# Patient Record
Sex: Male | Born: 1935 | Race: Black or African American | Hispanic: No | Marital: Single | Smoking: Former smoker
Health system: Southern US, Community
[De-identification: ages and names within clinical notes are randomized; demographics above are authoritative.]

## PROBLEM LIST (undated history)

## (undated) DIAGNOSIS — R569 Unspecified convulsions: Secondary | ICD-10-CM

## (undated) DIAGNOSIS — D519 Vitamin B12 deficiency anemia, unspecified: Secondary | ICD-10-CM

## (undated) DIAGNOSIS — R4182 Altered mental status, unspecified: Secondary | ICD-10-CM

## (undated) DIAGNOSIS — I10 Essential (primary) hypertension: Secondary | ICD-10-CM

## (undated) DIAGNOSIS — F54 Psychological and behavioral factors associated with disorders or diseases classified elsewhere: Secondary | ICD-10-CM

## (undated) DIAGNOSIS — N400 Enlarged prostate without lower urinary tract symptoms: Secondary | ICD-10-CM

## (undated) DIAGNOSIS — F79 Unspecified intellectual disabilities: Secondary | ICD-10-CM

## (undated) DIAGNOSIS — R1310 Dysphagia, unspecified: Secondary | ICD-10-CM

## (undated) DIAGNOSIS — R631 Polydipsia: Secondary | ICD-10-CM

## (undated) DIAGNOSIS — E538 Deficiency of other specified B group vitamins: Secondary | ICD-10-CM

## (undated) DIAGNOSIS — J189 Pneumonia, unspecified organism: Secondary | ICD-10-CM

## (undated) DIAGNOSIS — E871 Hypo-osmolality and hyponatremia: Secondary | ICD-10-CM

## (undated) HISTORY — DX: Dysphagia, unspecified: R13.10

## (undated) HISTORY — PX: NO PAST SURGERIES: SHX2092

## (undated) HISTORY — DX: Deficiency of other specified B group vitamins: E53.8

---

## 2000-02-01 ENCOUNTER — Inpatient Hospital Stay (HOSPITAL_COMMUNITY): Admission: EM | Admit: 2000-02-01 | Discharge: 2000-02-03 | Payer: Self-pay | Admitting: Emergency Medicine

## 2000-02-01 ENCOUNTER — Encounter: Payer: Self-pay | Admitting: Geriatric Medicine

## 2000-07-19 ENCOUNTER — Inpatient Hospital Stay (HOSPITAL_COMMUNITY): Admission: EM | Admit: 2000-07-19 | Discharge: 2000-07-20 | Payer: Self-pay | Admitting: Emergency Medicine

## 2000-07-19 ENCOUNTER — Encounter: Payer: Self-pay | Admitting: Internal Medicine

## 2000-07-19 ENCOUNTER — Encounter: Payer: Self-pay | Admitting: Emergency Medicine

## 2000-11-22 ENCOUNTER — Ambulatory Visit (HOSPITAL_COMMUNITY): Admission: RE | Admit: 2000-11-22 | Discharge: 2000-11-22 | Payer: Self-pay | Admitting: Internal Medicine

## 2001-06-03 ENCOUNTER — Encounter: Payer: Self-pay | Admitting: Emergency Medicine

## 2001-06-04 ENCOUNTER — Encounter: Payer: Self-pay | Admitting: Internal Medicine

## 2001-06-04 ENCOUNTER — Inpatient Hospital Stay (HOSPITAL_COMMUNITY): Admission: EM | Admit: 2001-06-04 | Discharge: 2001-06-06 | Payer: Self-pay | Admitting: Emergency Medicine

## 2003-12-25 ENCOUNTER — Emergency Department (HOSPITAL_COMMUNITY): Admission: EM | Admit: 2003-12-25 | Discharge: 2003-12-26 | Payer: Self-pay | Admitting: Emergency Medicine

## 2009-11-13 ENCOUNTER — Encounter: Admission: RE | Admit: 2009-11-13 | Discharge: 2009-11-13 | Payer: Self-pay | Admitting: Internal Medicine

## 2009-12-20 ENCOUNTER — Emergency Department (HOSPITAL_COMMUNITY): Admission: EM | Admit: 2009-12-20 | Discharge: 2009-12-21 | Payer: Self-pay | Admitting: Emergency Medicine

## 2009-12-22 ENCOUNTER — Inpatient Hospital Stay (HOSPITAL_COMMUNITY): Admission: EM | Admit: 2009-12-22 | Discharge: 2009-12-24 | Payer: Self-pay | Admitting: Emergency Medicine

## 2009-12-24 ENCOUNTER — Encounter (INDEPENDENT_AMBULATORY_CARE_PROVIDER_SITE_OTHER): Payer: Self-pay | Admitting: Internal Medicine

## 2009-12-24 ENCOUNTER — Ambulatory Visit: Payer: Self-pay | Admitting: Vascular Surgery

## 2011-01-30 LAB — URINE MICROSCOPIC-ADD ON

## 2011-01-30 LAB — URINALYSIS, ROUTINE W REFLEX MICROSCOPIC
Protein, ur: 30 mg/dL — AB
Urobilinogen, UA: 1 mg/dL (ref 0.0–1.0)

## 2011-01-30 LAB — BASIC METABOLIC PANEL
BUN: 6 mg/dL (ref 6–23)
BUN: 7 mg/dL (ref 6–23)
BUN: 8 mg/dL (ref 6–23)
CO2: 27 mEq/L (ref 19–32)
CO2: 28 mEq/L (ref 19–32)
CO2: 30 mEq/L (ref 19–32)
Calcium: 8.3 mg/dL — ABNORMAL LOW (ref 8.4–10.5)
Calcium: 8.6 mg/dL (ref 8.4–10.5)
Chloride: 93 mEq/L — ABNORMAL LOW (ref 96–112)
Chloride: 94 mEq/L — ABNORMAL LOW (ref 96–112)
Chloride: 94 mEq/L — ABNORMAL LOW (ref 96–112)
Creatinine, Ser: 0.71 mg/dL (ref 0.4–1.5)
Creatinine, Ser: 0.8 mg/dL (ref 0.4–1.5)
Creatinine, Ser: 0.91 mg/dL (ref 0.4–1.5)
GFR calc non Af Amer: >60 mL/min (ref >60)
Glucose, Bld: 107 mg/dL — ABNORMAL HIGH (ref 70–99)
Glucose, Bld: 88 mg/dL (ref 70–99)
Glucose, Bld: 91 mg/dL (ref 70–99)
Potassium: 3.7 mEq/L (ref 3.5–5.1)

## 2011-01-30 LAB — CBC
HCT: 38.4 % — ABNORMAL LOW (ref 39.0–52.0)
HCT: 38.8 % — ABNORMAL LOW (ref 39.0–52.0)
HCT: 40.8 % (ref 39.0–52.0)
MCHC: 34.1 g/dL (ref 30.0–36.0)
MCHC: 34.3 g/dL (ref 30.0–36.0)
MCHC: 34.8 g/dL (ref 30.0–36.0)
MCV: 96.3 fL (ref 78.0–100.0)
MCV: 96.3 fL (ref 78.0–100.0)
Platelets: 133 10*3/uL — ABNORMAL LOW (ref 150–400)
Platelets: 140 10*3/uL — ABNORMAL LOW (ref 150–400)
Platelets: 155 10*3/uL (ref 150–400)
RDW: 13.3 % (ref 11.5–15.5)
RDW: 13.4 % (ref 11.5–15.5)
RDW: 13.6 % (ref 11.5–15.5)
WBC: 6.4 10*3/uL (ref 4.0–10.5)

## 2011-01-30 LAB — URINE CULTURE: Culture: NO GROWTH

## 2011-01-30 LAB — POCT I-STAT, CHEM 8
Creatinine, Ser: 1 mg/dL (ref 0.4–1.5)
Hemoglobin: 13.6 g/dL (ref 13.0–17.0)
Sodium: 128 mEq/L — ABNORMAL LOW (ref 135–145)
TCO2: 27 mmol/L (ref 0–100)

## 2011-01-30 LAB — DIFFERENTIAL
Basophils Absolute: 0 10*3/uL (ref 0.0–0.1)
Basophils Absolute: 0 10*3/uL (ref 0.0–0.1)
Basophils Relative: 0 % (ref 0–1)
Basophils Relative: 0 % (ref 0–1)
Eosinophils Absolute: 0 10*3/uL (ref 0.0–0.7)
Eosinophils Absolute: 0 10*3/uL (ref 0.0–0.7)
Eosinophils Relative: 0 % (ref 0–5)
Eosinophils Relative: 0 % (ref 0–5)
Lymphs Abs: 0.7 10*3/uL (ref 0.7–4.0)
Neutrophils Relative %: 81 % — ABNORMAL HIGH (ref 43–77)

## 2011-01-30 LAB — POCT CARDIAC MARKERS

## 2011-01-30 LAB — GLUCOSE, CAPILLARY

## 2011-03-27 NOTE — H&P (Signed)
[place]. [facility]  Patient:    Jesus Ramsey, Jesus Ramsey                         MRN: [id number] Adm. Date:  02/01/00 Attending:  Ann Maki T. Stoneking, M.D.                         History and Physical  HISTORY OF PRESENT ILLNESS:  [name] is a very nice 77 year old black male, with possible mild mental retardation.  He is living in a rest home.  He has had a history of B12 deficiency and hypertension.  Mr. Grabe moved to [place] about three years ago from [place] to be closer to his brother.  The brother states that Mr. Leidy had poor judgment and requrires supervision.  Today the staff at the rest home noted that he was unsteady on his feet and was not responding verbally. He denies any recent fall or headache.  ALLERGIES:  No known drug allergies.  CURRENT MEDICATIONS: 1. Hydrochlorothiazide 25 mg once q.d. 2. Lopressor 50 mg b.i.d. 3. Vasotec 5 mg b.i.d.  PAST MEDICAL HISTORY: 1. Remarkable for hypertension. 2. Onychomycosis.  PAST SURGICAL HISTORY:  None.  FAMILY HISTORY:  Father died at the age of 34 of heart disease.  Mother died at the age of 39 during childbirth.  Seven siblings.  No chronic medical problems. One brother has elevated cholesterol.  SOCIAL HISTORY:  Does occasionally smoke a cigar.  No alcohol.  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  REVIEW OF SYSTEMS:  Difficult to obtain, due to his limited speech.  He does deny a headache.  PHYSICAL EXAMINATION:  VITAL SIGNS:  Temperature 97.8 degrees, blood pressure 139/82, respirations 16,  pulse 82.  O2 saturation on room air 98%.  HEENT:  Normocephalic, atraumatic.  Pupils equal, round, reactive to light. Virtually absent VWU:JWJX ratio on the left, or essentially 1:1.  Tympanic membranes occluded by cerumen.  Oropharynx moist.  LUNGS:  Clear.  HEART:  A regular rate and rhythm without murmurs, rubs, or gallops.  ABDOMEN:  Soft, no  hepatosplenomegaly or masses palpated.  EXTREMITIES:  No lower extremity edema.  NEUROLOGIC:  He favors gaze preference to the left.  He is minimally verbal, occasionally one-word answers to direct questions.  Frequently does not answer t all.  Occasionally follows commands.  His brother states that this behavior is ery different.  Motor examination:  4/4 strength bilaterally.  Deep tendon reflexes 2+ biceps, 3+ patellar.  Toes downgoing.  ASSESSMENT: 1. Acute change in mental status, with new gait imbalance, with a history of    hypertension.  Suspect ischemic stroke versus central nervous system bleed.    Also cannot rule out drug effect, although would be unlikely, as he is in    a rest home.  PLAN:  Will obtain a urine drug screen.  2. Hypertension, under good control today.  PLAN:  Will admit.  CT of the brain.  If not bleed, consider aspirin and heparin.DD:  02/01/00 TD:  02/01/00 Job: [id number]

## 2011-03-27 NOTE — H&P (Signed)
[facility]. [facility]  Patient:    Jesus Ramsey, Jesus Ramsey                         MRN: [id number] Adm. Date:  [id number] Attending:  Cathren Laine                         History and Physical  CHIEF COMPLAINT: Weakness on the right side.  HISTORY OF PRESENT ILLNESS: Mr. Jesus Ramsey is a 77 year old male, whose primary care physician is Dr. Pearla Dubonnet, who is a resident at an assisted living facility here in [place], [place].  He had been found sitting on the floor in his room.  Apparently he was unable to stand and was sitting on the floor and when tried to stand leaned to the right.  He apparently has had no other significant symptoms and has been in his usual state of health recently.  PAST MEDICAL HISTORY:  1. Mild mental retardation.  2. Hypertension.  3. Psychogenic polydipsia.  4. Hyponatremia secondary to #3.  5. History of seizures with severe hyponatremia.  6. History of mild proteinuria.  CURRENT MEDICATIONS:  1. Flomax 0.4 mg p.o. q.h.s.  2. Metoprolol 50 mg p.o. b.i.d.  3. Enalapril 5 mg p.o. q.d.  4. Sodium chloride 16 mEq p.o. q.d.  5. Tylenol p.r.n.  ALLERGIES: None.  REVIEW OF SYSTEMS: He denies any chest pain or palpitations, shortness of breath, orthopnea, nausea, vomiting, or diarrhea.  No change in bowels or blood in the stool.  No urinary symptoms.  No new musculoskeletal symptoms. No weight gain or weight loss.  Denies symptoms of fever or chills.  No depression, anxiety, or sleep disturbance.  SOCIAL HISTORY: The patient smokes one cigar per day.  Does not drink any alcohol.  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  He has a brother who lives here in [place], [place] named [employer].  FAMILY HISTORY: Father died at 54 from heart disease.  Mother died at 59, her death was associated with childbirth.  The patient was [age] years old at the time of his mothers death.   He has seven siblings, who are apparently in good health.  One of his brothers has hyperlipidemia and one also has had colon cancer.  PHYSICAL EXAMINATION:  VITAL SIGNS: Blood pressure 140/80, pulse 100, respiratory rate 24, temperature 98.5 degrees.  HEENT: Tympanic membranes are normal.  PERRL.  EOMI.  Nasal mucosa normal. Mucosa within normal limits.  NECK: Supple.  No adenopathy or JVD.  No bruits.  LUNGS: Clear to auscultation bilaterally.  HEART: Regular rate and rhythm without murmurs, rubs, or gallops.  ABDOMEN: Soft, nontender, nondistended.  Positive bowel sounds.  No hepatosplenomegaly.  EXTREMITIES: No clubbing, cyanosis, or edema.  LABORATORY DATA: WBC elevated at 17,000, hemoglobin normal at 14; 90% neutrophils; platelet count 252,000.  Sodium low at 112, chloride low at 81, BUN and creatinine normal.  LFTs are normal.  Head CT normal.  ASSESSMENT/PLAN:  1. Hyponatremia in the setting of history of psychogenic polydipsia.  Will     limit his p.o. fluid intake.  He chronically takes a sodium tablet daily.     Will check a urine sodium osmolality.  2. Leukocytosis.  Afebrile, no focus of infection.  He has possibly and     probably had a seizure.  He has had this before with hyponatremia.  Will     observe him and recheck CBC, UA, and chest x-ray.  Will not prescribe     antibiotics at present.  3. Hypertension.  Continue current medications. DD:  06/04/01 TD:  06/04/01 Job: [id number]

## 2011-03-27 NOTE — Discharge Summary (Signed)
. [facility]  Patient:    Jesus Ramsey, Jesus Ramsey                         MRN: [id number] Adm. Date:  [id number] Disc. Date: [id number] Attending:  Pearla Dubonnet                           Discharge Summary  DISCHARGE DIAGNOSES: 1. Acute mental status change and new gait imbalance--resolved. 2. Marked hyponatremia and hypochloremia likely causing #1 and likely    secondary to diuretic therapy and psychogenic polydipsia. 3. History of hypertension. 4. History of B12 deficiency. 5. Mild mental retardation, lifelong.  DISCHARGE MEDICATIONS: 1. Lopressor 25 mg b.i.d. 2. Vasotec 5 mg q.d.  DISCHARGE INSTRUCTIONS:  The patient should drink no more than three to four extra glasses of water daily over and above what he has with his usual meals.  PROCEDURES:  Non-contrast head CT February 02, 2000.  No intracranial bleed. There was a right maxillary sinus air fluid level but no symptoms of sinusitis.  HOSPITAL COURSE:  Jesus Ramsey is a very pleasant 77 year old male who is a resident at [facility] nursing home, which is formerly [facility]. EMS was dispatched on February 01, 2000 because the staff noted that the patient was walking into walls and not talking since that morning.  The patient has always had very mild mental retardation but had relatively good communication skills but the patient could not speak with staff or with EMS personnel.  He was brought into the [facility] Emergency Room for further evaluation.  There was no obvious history of fall or headache or any head trauma.  On initial examination by Dr. Merlene Laughter on admission, he apparently had a gaze preference to the left.  He was minimally verbal.  He occasionally had one-word answers to direct questions.  Frequently, he would not answer any questions.  He would only occasionally follow commands.  His brother, Mr. Danil Wedge, stated that he was very different than usual.  He had  4/4 strength bilaterally and deep tendon reflexes were 2+ and 3+ throughout.  Toes were downgoing.  Gait was unsteady.  The patient had a history of hypertension and it was felt that he may have an ischemic stroke versus central nervous system bleed.  Dr. Pete Glatter also thought that the patient may have a drug effect.  When the electrolytes were obtained, it was found that his sodium level was 112 and chloride was 80--markedly abnormal.  This was felt to likely be secondary to increased water intake at the facility, which he had a habit of drinking six to eight extra glasses of water a day and also the fact that he was on hydrochlorothiazide therapy.  The patient was treated with normal saline at 70 cc an hour.  The patient also developed urinary retention and this was felt to be likely secondary to his electrolyte abnormality.  Foley catheter was actually discontinued on the second day of admission and the patient was able to urinate normally.  By February 03, 2000, his sodium was up to 128 and chloride was up to 97. Potassium 3.6, bicarb 26, BUN 8, and creatinine 0.7.  His confusion had completely resolved and his mental status was back to baseline.  The patient was discharged back to the rest home, to not take hydrochlorothiazide, and to keep extra glasses of water to no more than three or  four a day, other than what he has at meals.  FOLLOW-UP:  The patient was to be followed up at [facility] by Dr. Pearla Dubonnet, myself, in one week. DD:  04/01/00 TD:  04/04/00 Job: [id number]

## 2011-03-27 NOTE — Discharge Summary (Signed)
[place]. [facility]  Patient:    Jesus Ramsey, Jesus Ramsey                         MRN: [id number] Adm. Date:  [id number] Disc. Date: 06/06/01 Attending:  Kae Heller Dictator:   Barbette Hair. Vaughan Basta., M.D.                           Discharge Summary  DISCHARGE DIAGNOSES: 1. Hyponatremia. 2. Leukocytosis. 3. Mental status changes, secondary to hyponatremia. 4. Psychogenic polydipsia. 5. Hypertension. 6. Mental retardation.  HISTORY OF PRESENT ILLNESS:  The patient was admitted after having been found sitting in the floor and leaning to the right.  He was also noted to be markedly hyponatremic at the time of admission with a sodium of 112.  The patient was producing a large volume of very dilute urine.  He has a known history of psychogenic polydipsia.  He is off of all diuretics and has not been placed on any.  His urine osmolality was markedly low at 144 and serum osmolality was also low at 233 which were consistent with psychogenic polydipsia.  HOSPITAL COURSE:  The patient was admitted to the hospital, all free fluids were held and he was continued on all of his prehospital medications.  He improved significantly and within 36 hours of his admission sodium was up to 127, at the time of discharge sodium was up to 130.  The patient also was noted to have a fairly significantly elevated white count at the time of admission at 17,000.  There was no focus of infection identified and no antibiotics were started.  He had 90% neutrophils.  Within 12 hours the white count decreased to 11,000 and then within 24 hours down to 6000 with no left shift.  It is noted that the patient has a known history of seizures with hyponatremia.  My suspicion is that prior to hospitalization as he was found witnessed on the ground that he may have had a seizure and that caused demargination and the resultant leukocytosis.  He improved without treatment and did not have  a seizure during the hospitalization, although seizure precautions were maintained.  DISCHARGE MEDICATIONS: 1. Flomax 0.4 mg p.o. q.h.s. 2. Metoprolol 50 mg p.o. b.i.d. 3. Vasotec 5 mg p.o. q.d. 4. Sodium chloride 1 g daily. 5. Tylenol 325 two p.o. q.6h. p.r.n.  SPECIAL INSTRUCTIONS:  The patient is to avoid all extra and free fluids and only those with meals.  FOLLOWUP:  He is to follow up with Dr. Kevan Ny in two weeks.  CONDITION ON DISCHARGE:  He is discharged in good, improved and in his usual state of health. DD:  06/06/01 TD:  06/06/01 Job: [id number]

## 2011-03-27 NOTE — Discharge Summary (Signed)
[place]. [facility]  Patient:    Jesus Ramsey, Jesus Ramsey                         MRN: [id number] Adm. Date:  [id number] Disc. Date: [id number] Attending:  Pearla Dubonnet                           Discharge Summary  DATE OF BIRTH:  July 10, 1931  DISCHARGE DIAGNOSES: 1. Cycogenic polydipsia. 2. Hyponatremia secondary to #1. 3. Hypertension. 4. Mild proteinuria. 5. Mild mental retardation. 6. Mild right shoulder pain, chronic.  DISCHARGE MEDICATIONS: 1. Vasotec 5 mg daily. 2. Lopressor 25 mg b.i.d. 3. Tylenol 650 mg p.o. q.4-6h. p.r.n. shoulder pain. 4. Prune juice daily as per prior to admission p.r.n. constipation.  HOSPITAL COURSE:  Jesus Ramsey is a pleasant 77 year old male with a history of hypertension and mild cognitive dysfunction which is lifelong.  He was admitted in March 2001, for hyponatremia and hypochloremia felt to be secondary to hydrochlorothiazide, hypertension as well as cycogenic polydipsia.  He recovered well and was discharged at that time.  On the night prior to admission he was found on the floor in his room and confused.  He was dysarthric and also incontinent of urine.  His only complaint was one of his right shoulder pain chronically which is mild.  He seemed to be less oriented than usual.  He was brought to the [facility] Emergency Room where an ISTAT was drawn, and was found to have a sodium of 112, and a chloride of 75.  Potassium was 4.7, bicarbonate 25, BUN was low at 4 - though he did not look cachectic, and blood sugar 119.  Creatinine was not measured.  CBC was normal.  Urine osmolalities were low at 224, and serum osmolalities were low at 230 - normal being 275 to 300.  The patient was hydrated with normal saline, and by 2 p.m. on July 19, 2000, his sodium level was up to 120.  Other laboratories which were drawn included a serum Cortisol which was 16.8 at 8:30 a.m. on July 19, 2000.  TSH was 0.886 on  July 19, 2000, and vitamin B12 was 310, normal 211 to 911 on July 19, 2000 (there was a question of a history of vitamin B12 deficiency).  He is on no supplements, and this rules this out.  The patient was treated with intravenous normal saline at 100 cc an hour, and this will be discontinued today, the day of discharge.  The patient was counseled about the need to only drink liquids with meals.  He does tend to drink glasses of water frequently during the day.  We will plan to check a BMET on him weekly x 3.  While hospitalized his hypertension was under good control.  He was monitored the remainder of the time during his hospitalization. DD:  07/20/00 TD:  07/21/00 Job: [id number]

## 2011-05-12 ENCOUNTER — Ambulatory Visit: Payer: Medicare Other | Attending: Neurology | Admitting: Physical Therapy

## 2011-05-12 DIAGNOSIS — R269 Unspecified abnormalities of gait and mobility: Secondary | ICD-10-CM | POA: Insufficient documentation

## 2011-05-12 DIAGNOSIS — IMO0001 Reserved for inherently not codable concepts without codable children: Secondary | ICD-10-CM | POA: Insufficient documentation

## 2011-05-14 ENCOUNTER — Ambulatory Visit: Payer: Medicare Other | Admitting: Physical Therapy

## 2011-05-19 ENCOUNTER — Ambulatory Visit: Payer: Self-pay | Admitting: Physical Therapy

## 2011-05-22 ENCOUNTER — Ambulatory Visit: Payer: Self-pay | Admitting: Physical Therapy

## 2011-05-26 ENCOUNTER — Ambulatory Visit: Payer: Self-pay | Admitting: Physical Therapy

## 2011-05-28 ENCOUNTER — Ambulatory Visit: Payer: Self-pay | Admitting: Physical Therapy

## 2011-06-02 ENCOUNTER — Ambulatory Visit: Payer: Self-pay | Admitting: Physical Therapy

## 2011-06-04 ENCOUNTER — Ambulatory Visit: Payer: Self-pay | Admitting: Physical Therapy

## 2011-06-09 ENCOUNTER — Ambulatory Visit: Payer: Self-pay | Admitting: Physical Therapy

## 2011-08-07 ENCOUNTER — Other Ambulatory Visit: Payer: Self-pay | Admitting: Neurology

## 2011-08-07 DIAGNOSIS — F09 Unspecified mental disorder due to known physiological condition: Secondary | ICD-10-CM

## 2011-08-07 DIAGNOSIS — R269 Unspecified abnormalities of gait and mobility: Secondary | ICD-10-CM

## 2011-08-12 ENCOUNTER — Ambulatory Visit
Admission: RE | Admit: 2011-08-12 | Discharge: 2011-08-12 | Disposition: A | Discharge status: Home / Self Care | Payer: Medicare Other | Source: Ambulatory Visit | Attending: Neurology | Admitting: Neurology

## 2011-08-12 DIAGNOSIS — F09 Unspecified mental disorder due to known physiological condition: Secondary | ICD-10-CM

## 2011-08-12 DIAGNOSIS — R269 Unspecified abnormalities of gait and mobility: Secondary | ICD-10-CM

## 2011-09-13 ENCOUNTER — Encounter: Payer: Self-pay | Admitting: Emergency Medicine

## 2011-09-13 ENCOUNTER — Emergency Department (HOSPITAL_COMMUNITY)
Admission: EM | Admit: 2011-09-13 | Discharge: 2011-09-13 | Disposition: A | Discharge status: Home / Self Care | Payer: Medicare Other | Attending: Emergency Medicine | Admitting: Emergency Medicine

## 2011-09-13 DIAGNOSIS — T1490XA Injury, unspecified, initial encounter: Secondary | ICD-10-CM | POA: Insufficient documentation

## 2011-09-13 DIAGNOSIS — I10 Essential (primary) hypertension: Secondary | ICD-10-CM | POA: Insufficient documentation

## 2011-09-13 DIAGNOSIS — Y9210 Unspecified residential institution as the place of occurrence of the external cause: Secondary | ICD-10-CM | POA: Insufficient documentation

## 2011-09-13 DIAGNOSIS — F79 Unspecified intellectual disabilities: Secondary | ICD-10-CM | POA: Insufficient documentation

## 2011-09-13 DIAGNOSIS — W010XXA Fall on same level from slipping, tripping and stumbling without subsequent striking against object, initial encounter: Secondary | ICD-10-CM | POA: Insufficient documentation

## 2011-09-13 DIAGNOSIS — W19XXXA Unspecified fall, initial encounter: Secondary | ICD-10-CM

## 2011-09-13 HISTORY — DX: Unspecified convulsions: R56.9

## 2011-09-13 HISTORY — DX: Unspecified intellectual disabilities: F79

## 2011-09-13 HISTORY — DX: Essential (primary) hypertension: I10

## 2011-09-13 NOTE — ED Provider Notes (Signed)
History     CSN: [id number] Arrival date & time: 09/13/2011  5:10 PM   First MD Initiated Contact with Patient 09/13/11 1826      Chief Complaint  Patient presents with  . Fall    pt lost his balance, no LOC, pt denies c/o at present    (Consider location/radiation/quality/duration/timing/severity/associated sxs/prior treatment) HPI History provided by patient.  Pt resides at nursing home.  Slipped on spilled water today and fell, landing on his buttocks.  Did not hit head.  Denies neck/back pain or pain anywhere else.  Ambulatory.  No prior h/o falls.  Not anti-coagulated.    Past Medical History  Diagnosis Date  . Mental retardation   . Hypertension   . Seizures     No past surgical history on file.  No family history on file.  History  Substance Use Topics  . Smoking status: Not on file  . Smokeless tobacco: Not on file  . Alcohol Use:       Review of Systems  All other systems reviewed and are negative.    Allergies  Review of patient's allergies indicates no known allergies.  Home Medications   Current Outpatient Rx  Name Route Sig Dispense Refill  . ACETAMINOPHEN 325 MG PO TABS Oral Take 650 mg by mouth every 6 (six) hours as needed. pain    . LISINOPRIL 10 MG PO TABS Oral Take 10 mg by mouth daily.     Marland Kitchen METOPROLOL TARTRATE 50 MG PO TABS Oral Take 50 mg by mouth 2 (two) times daily.     Marland Kitchen TAMSULOSIN HCL 0.4 MG PO CAPS Oral Take 0.4 mg by mouth daily.       BP 135/76  Pulse 98  Temp 98 F (36.7 C)  Resp 20  SpO2 98%  Physical Exam  Nursing note and vitals reviewed. Constitutional: He is oriented to person, place, and time. He appears well-developed and well-nourished. No distress.  HENT:  Head: Normocephalic and atraumatic.  Eyes:       Normal appearance  Neck: Normal range of motion.  Musculoskeletal:       Entire spine and buttocks non-tender.  No ecchymosis to back or buttocks. Pelvis stable.  Full active ROM of all four extremities.   Ambulatory.  Neurological: He is alert and oriented to person, place, and time.  Psychiatric: He has a normal mood and affect. His behavior is normal.    ED Course  Procedures (including critical care time)  Labs Reviewed - No data to display No results found.   1. Fall       MDM  Pt w/out prior h/o falls had a mechanical fall at nursing home; landed on buttocks, did not hit head and denies having pain anywhere.  Spine non-tender, pelvis stable and full active ROM of all extremities on exam.  Discharged home.          Arie Sabina Hodge, [place] 09/13/11 1925  Medical screening examination/treatment/procedure(s) were conducted as a shared visit with non-physician practitioner(s) and myself.  I personally evaluated the patient during the encounter This elderly M presents following a mech fall at [facility].  On PE, he is in no distress, w/o sig appreciable abnormalities.  VS stable.  D/C home.  Gerhard Munch, MD 09/13/11 2047

## 2011-09-13 NOTE — ED Notes (Addendum)
Pt a/o x 3, resting quietly, skin warm and dry, respirations even and unlabored, no acute distress noted, no needs identified at this time, awaiting MD eval, given po fluids, tolerating well

## 2011-09-13 NOTE — ED Notes (Signed)
Family at bedside updated on pt condition.

## 2011-09-13 NOTE — ED Notes (Signed)
[facility] called for transport.  

## 2011-09-13 NOTE — ED Notes (Signed)
[id number]<UY> Expected date:09/13/11<BR> Expected time: 4:57 PM<BR> Means of arrival:Ambulance<BR> Comments:<BR> [id number] Fall with no obj injury.  Nursing Home req eval

## 2011-09-22 DIAGNOSIS — I10 Essential (primary) hypertension: Secondary | ICD-10-CM | POA: Diagnosis not present

## 2011-09-22 DIAGNOSIS — F70 Mild intellectual disabilities: Secondary | ICD-10-CM | POA: Diagnosis not present

## 2011-09-22 DIAGNOSIS — Z4800 Encounter for change or removal of nonsurgical wound dressing: Secondary | ICD-10-CM | POA: Diagnosis not present

## 2011-09-22 DIAGNOSIS — R262 Difficulty in walking, not elsewhere classified: Secondary | ICD-10-CM | POA: Diagnosis not present

## 2011-09-22 DIAGNOSIS — H543 Unqualified visual loss, both eyes: Secondary | ICD-10-CM | POA: Diagnosis not present

## 2011-09-22 DIAGNOSIS — R3981 Functional urinary incontinence: Secondary | ICD-10-CM | POA: Diagnosis not present

## 2011-09-22 DIAGNOSIS — H269 Unspecified cataract: Secondary | ICD-10-CM | POA: Diagnosis not present

## 2011-09-22 DIAGNOSIS — R631 Polydipsia: Secondary | ICD-10-CM | POA: Diagnosis not present

## 2011-09-22 DIAGNOSIS — L84 Corns and callosities: Secondary | ICD-10-CM | POA: Diagnosis not present

## 2011-09-24 DIAGNOSIS — F70 Mild intellectual disabilities: Secondary | ICD-10-CM | POA: Diagnosis not present

## 2011-09-24 DIAGNOSIS — Z4800 Encounter for change or removal of nonsurgical wound dressing: Secondary | ICD-10-CM | POA: Diagnosis not present

## 2011-09-24 DIAGNOSIS — H543 Unqualified visual loss, both eyes: Secondary | ICD-10-CM | POA: Diagnosis not present

## 2011-09-24 DIAGNOSIS — I10 Essential (primary) hypertension: Secondary | ICD-10-CM | POA: Diagnosis not present

## 2011-09-24 DIAGNOSIS — R262 Difficulty in walking, not elsewhere classified: Secondary | ICD-10-CM | POA: Diagnosis not present

## 2011-09-24 DIAGNOSIS — L84 Corns and callosities: Secondary | ICD-10-CM | POA: Diagnosis not present

## 2011-09-25 DIAGNOSIS — I10 Essential (primary) hypertension: Secondary | ICD-10-CM | POA: Diagnosis not present

## 2011-09-25 DIAGNOSIS — H543 Unqualified visual loss, both eyes: Secondary | ICD-10-CM | POA: Diagnosis not present

## 2011-09-25 DIAGNOSIS — Z4800 Encounter for change or removal of nonsurgical wound dressing: Secondary | ICD-10-CM | POA: Diagnosis not present

## 2011-09-25 DIAGNOSIS — F70 Mild intellectual disabilities: Secondary | ICD-10-CM | POA: Diagnosis not present

## 2011-09-25 DIAGNOSIS — L84 Corns and callosities: Secondary | ICD-10-CM | POA: Diagnosis not present

## 2011-09-25 DIAGNOSIS — R262 Difficulty in walking, not elsewhere classified: Secondary | ICD-10-CM | POA: Diagnosis not present

## 2011-09-28 DIAGNOSIS — H543 Unqualified visual loss, both eyes: Secondary | ICD-10-CM | POA: Diagnosis not present

## 2011-09-28 DIAGNOSIS — R262 Difficulty in walking, not elsewhere classified: Secondary | ICD-10-CM | POA: Diagnosis not present

## 2011-09-28 DIAGNOSIS — L84 Corns and callosities: Secondary | ICD-10-CM | POA: Diagnosis not present

## 2011-09-28 DIAGNOSIS — F70 Mild intellectual disabilities: Secondary | ICD-10-CM | POA: Diagnosis not present

## 2011-09-28 DIAGNOSIS — I10 Essential (primary) hypertension: Secondary | ICD-10-CM | POA: Diagnosis not present

## 2011-09-28 DIAGNOSIS — Z4800 Encounter for change or removal of nonsurgical wound dressing: Secondary | ICD-10-CM | POA: Diagnosis not present

## 2011-09-30 DIAGNOSIS — R262 Difficulty in walking, not elsewhere classified: Secondary | ICD-10-CM | POA: Diagnosis not present

## 2011-09-30 DIAGNOSIS — I10 Essential (primary) hypertension: Secondary | ICD-10-CM | POA: Diagnosis not present

## 2011-09-30 DIAGNOSIS — Z4800 Encounter for change or removal of nonsurgical wound dressing: Secondary | ICD-10-CM | POA: Diagnosis not present

## 2011-09-30 DIAGNOSIS — L84 Corns and callosities: Secondary | ICD-10-CM | POA: Diagnosis not present

## 2011-09-30 DIAGNOSIS — F70 Mild intellectual disabilities: Secondary | ICD-10-CM | POA: Diagnosis not present

## 2011-09-30 DIAGNOSIS — H543 Unqualified visual loss, both eyes: Secondary | ICD-10-CM | POA: Diagnosis not present

## 2011-10-05 DIAGNOSIS — Z4800 Encounter for change or removal of nonsurgical wound dressing: Secondary | ICD-10-CM | POA: Diagnosis not present

## 2011-10-05 DIAGNOSIS — L84 Corns and callosities: Secondary | ICD-10-CM | POA: Diagnosis not present

## 2011-10-05 DIAGNOSIS — R262 Difficulty in walking, not elsewhere classified: Secondary | ICD-10-CM | POA: Diagnosis not present

## 2011-10-05 DIAGNOSIS — F70 Mild intellectual disabilities: Secondary | ICD-10-CM | POA: Diagnosis not present

## 2011-10-05 DIAGNOSIS — I10 Essential (primary) hypertension: Secondary | ICD-10-CM | POA: Diagnosis not present

## 2011-10-05 DIAGNOSIS — H543 Unqualified visual loss, both eyes: Secondary | ICD-10-CM | POA: Diagnosis not present

## 2011-10-07 DIAGNOSIS — Z4800 Encounter for change or removal of nonsurgical wound dressing: Secondary | ICD-10-CM | POA: Diagnosis not present

## 2011-10-07 DIAGNOSIS — F70 Mild intellectual disabilities: Secondary | ICD-10-CM | POA: Diagnosis not present

## 2011-10-07 DIAGNOSIS — I10 Essential (primary) hypertension: Secondary | ICD-10-CM | POA: Diagnosis not present

## 2011-10-07 DIAGNOSIS — L84 Corns and callosities: Secondary | ICD-10-CM | POA: Diagnosis not present

## 2011-10-07 DIAGNOSIS — R262 Difficulty in walking, not elsewhere classified: Secondary | ICD-10-CM | POA: Diagnosis not present

## 2011-10-07 DIAGNOSIS — H543 Unqualified visual loss, both eyes: Secondary | ICD-10-CM | POA: Diagnosis not present

## 2011-10-08 DIAGNOSIS — I10 Essential (primary) hypertension: Secondary | ICD-10-CM | POA: Diagnosis not present

## 2011-10-08 DIAGNOSIS — L84 Corns and callosities: Secondary | ICD-10-CM | POA: Diagnosis not present

## 2011-10-08 DIAGNOSIS — F70 Mild intellectual disabilities: Secondary | ICD-10-CM | POA: Diagnosis not present

## 2011-10-08 DIAGNOSIS — H543 Unqualified visual loss, both eyes: Secondary | ICD-10-CM | POA: Diagnosis not present

## 2011-10-08 DIAGNOSIS — R262 Difficulty in walking, not elsewhere classified: Secondary | ICD-10-CM | POA: Diagnosis not present

## 2011-10-08 DIAGNOSIS — Z4800 Encounter for change or removal of nonsurgical wound dressing: Secondary | ICD-10-CM | POA: Diagnosis not present

## 2011-10-12 DIAGNOSIS — H543 Unqualified visual loss, both eyes: Secondary | ICD-10-CM | POA: Diagnosis not present

## 2011-10-12 DIAGNOSIS — Z4800 Encounter for change or removal of nonsurgical wound dressing: Secondary | ICD-10-CM | POA: Diagnosis not present

## 2011-10-12 DIAGNOSIS — F70 Mild intellectual disabilities: Secondary | ICD-10-CM | POA: Diagnosis not present

## 2011-10-12 DIAGNOSIS — L84 Corns and callosities: Secondary | ICD-10-CM | POA: Diagnosis not present

## 2011-10-12 DIAGNOSIS — R262 Difficulty in walking, not elsewhere classified: Secondary | ICD-10-CM | POA: Diagnosis not present

## 2011-10-12 DIAGNOSIS — I10 Essential (primary) hypertension: Secondary | ICD-10-CM | POA: Diagnosis not present

## 2011-10-13 DIAGNOSIS — F70 Mild intellectual disabilities: Secondary | ICD-10-CM | POA: Diagnosis not present

## 2011-10-13 DIAGNOSIS — L84 Corns and callosities: Secondary | ICD-10-CM | POA: Diagnosis not present

## 2011-10-13 DIAGNOSIS — I10 Essential (primary) hypertension: Secondary | ICD-10-CM | POA: Diagnosis not present

## 2011-10-13 DIAGNOSIS — H543 Unqualified visual loss, both eyes: Secondary | ICD-10-CM | POA: Diagnosis not present

## 2011-10-13 DIAGNOSIS — R262 Difficulty in walking, not elsewhere classified: Secondary | ICD-10-CM | POA: Diagnosis not present

## 2011-10-13 DIAGNOSIS — Z4800 Encounter for change or removal of nonsurgical wound dressing: Secondary | ICD-10-CM | POA: Diagnosis not present

## 2011-10-14 DIAGNOSIS — L84 Corns and callosities: Secondary | ICD-10-CM | POA: Diagnosis not present

## 2011-10-14 DIAGNOSIS — R262 Difficulty in walking, not elsewhere classified: Secondary | ICD-10-CM | POA: Diagnosis not present

## 2011-10-14 DIAGNOSIS — H543 Unqualified visual loss, both eyes: Secondary | ICD-10-CM | POA: Diagnosis not present

## 2011-10-14 DIAGNOSIS — F70 Mild intellectual disabilities: Secondary | ICD-10-CM | POA: Diagnosis not present

## 2011-10-14 DIAGNOSIS — I10 Essential (primary) hypertension: Secondary | ICD-10-CM | POA: Diagnosis not present

## 2011-10-14 DIAGNOSIS — Z4800 Encounter for change or removal of nonsurgical wound dressing: Secondary | ICD-10-CM | POA: Diagnosis not present

## 2011-10-15 DIAGNOSIS — H543 Unqualified visual loss, both eyes: Secondary | ICD-10-CM | POA: Diagnosis not present

## 2011-10-15 DIAGNOSIS — Z4800 Encounter for change or removal of nonsurgical wound dressing: Secondary | ICD-10-CM | POA: Diagnosis not present

## 2011-10-15 DIAGNOSIS — L84 Corns and callosities: Secondary | ICD-10-CM | POA: Diagnosis not present

## 2011-10-15 DIAGNOSIS — R262 Difficulty in walking, not elsewhere classified: Secondary | ICD-10-CM | POA: Diagnosis not present

## 2011-10-15 DIAGNOSIS — I10 Essential (primary) hypertension: Secondary | ICD-10-CM | POA: Diagnosis not present

## 2011-10-15 DIAGNOSIS — F70 Mild intellectual disabilities: Secondary | ICD-10-CM | POA: Diagnosis not present

## 2011-10-19 DIAGNOSIS — F70 Mild intellectual disabilities: Secondary | ICD-10-CM | POA: Diagnosis not present

## 2011-10-19 DIAGNOSIS — Z4800 Encounter for change or removal of nonsurgical wound dressing: Secondary | ICD-10-CM | POA: Diagnosis not present

## 2011-10-19 DIAGNOSIS — R262 Difficulty in walking, not elsewhere classified: Secondary | ICD-10-CM | POA: Diagnosis not present

## 2011-10-19 DIAGNOSIS — L84 Corns and callosities: Secondary | ICD-10-CM | POA: Diagnosis not present

## 2011-10-19 DIAGNOSIS — H543 Unqualified visual loss, both eyes: Secondary | ICD-10-CM | POA: Diagnosis not present

## 2011-10-19 DIAGNOSIS — I10 Essential (primary) hypertension: Secondary | ICD-10-CM | POA: Diagnosis not present

## 2011-10-22 DIAGNOSIS — L84 Corns and callosities: Secondary | ICD-10-CM | POA: Diagnosis not present

## 2011-10-22 DIAGNOSIS — R262 Difficulty in walking, not elsewhere classified: Secondary | ICD-10-CM | POA: Diagnosis not present

## 2011-10-22 DIAGNOSIS — I10 Essential (primary) hypertension: Secondary | ICD-10-CM | POA: Diagnosis not present

## 2011-10-22 DIAGNOSIS — F70 Mild intellectual disabilities: Secondary | ICD-10-CM | POA: Diagnosis not present

## 2011-10-22 DIAGNOSIS — Z4800 Encounter for change or removal of nonsurgical wound dressing: Secondary | ICD-10-CM | POA: Diagnosis not present

## 2011-10-22 DIAGNOSIS — H543 Unqualified visual loss, both eyes: Secondary | ICD-10-CM | POA: Diagnosis not present

## 2011-10-26 DIAGNOSIS — Z4800 Encounter for change or removal of nonsurgical wound dressing: Secondary | ICD-10-CM | POA: Diagnosis not present

## 2011-10-26 DIAGNOSIS — H543 Unqualified visual loss, both eyes: Secondary | ICD-10-CM | POA: Diagnosis not present

## 2011-10-26 DIAGNOSIS — R262 Difficulty in walking, not elsewhere classified: Secondary | ICD-10-CM | POA: Diagnosis not present

## 2011-10-26 DIAGNOSIS — L84 Corns and callosities: Secondary | ICD-10-CM | POA: Diagnosis not present

## 2011-10-26 DIAGNOSIS — F70 Mild intellectual disabilities: Secondary | ICD-10-CM | POA: Diagnosis not present

## 2011-10-26 DIAGNOSIS — I10 Essential (primary) hypertension: Secondary | ICD-10-CM | POA: Diagnosis not present

## 2011-10-28 DIAGNOSIS — I10 Essential (primary) hypertension: Secondary | ICD-10-CM | POA: Diagnosis not present

## 2011-10-28 DIAGNOSIS — F70 Mild intellectual disabilities: Secondary | ICD-10-CM | POA: Diagnosis not present

## 2011-10-28 DIAGNOSIS — L84 Corns and callosities: Secondary | ICD-10-CM | POA: Diagnosis not present

## 2011-10-28 DIAGNOSIS — H543 Unqualified visual loss, both eyes: Secondary | ICD-10-CM | POA: Diagnosis not present

## 2011-10-28 DIAGNOSIS — R262 Difficulty in walking, not elsewhere classified: Secondary | ICD-10-CM | POA: Diagnosis not present

## 2011-10-28 DIAGNOSIS — Z4800 Encounter for change or removal of nonsurgical wound dressing: Secondary | ICD-10-CM | POA: Diagnosis not present

## 2011-10-31 DIAGNOSIS — F70 Mild intellectual disabilities: Secondary | ICD-10-CM | POA: Diagnosis not present

## 2011-10-31 DIAGNOSIS — H543 Unqualified visual loss, both eyes: Secondary | ICD-10-CM | POA: Diagnosis not present

## 2011-10-31 DIAGNOSIS — R262 Difficulty in walking, not elsewhere classified: Secondary | ICD-10-CM | POA: Diagnosis not present

## 2011-10-31 DIAGNOSIS — Z4800 Encounter for change or removal of nonsurgical wound dressing: Secondary | ICD-10-CM | POA: Diagnosis not present

## 2011-10-31 DIAGNOSIS — I10 Essential (primary) hypertension: Secondary | ICD-10-CM | POA: Diagnosis not present

## 2011-10-31 DIAGNOSIS — L84 Corns and callosities: Secondary | ICD-10-CM | POA: Diagnosis not present

## 2011-11-02 DIAGNOSIS — Z4800 Encounter for change or removal of nonsurgical wound dressing: Secondary | ICD-10-CM | POA: Diagnosis not present

## 2011-11-02 DIAGNOSIS — F70 Mild intellectual disabilities: Secondary | ICD-10-CM | POA: Diagnosis not present

## 2011-11-02 DIAGNOSIS — H543 Unqualified visual loss, both eyes: Secondary | ICD-10-CM | POA: Diagnosis not present

## 2011-11-02 DIAGNOSIS — R262 Difficulty in walking, not elsewhere classified: Secondary | ICD-10-CM | POA: Diagnosis not present

## 2011-11-02 DIAGNOSIS — I10 Essential (primary) hypertension: Secondary | ICD-10-CM | POA: Diagnosis not present

## 2011-11-02 DIAGNOSIS — L84 Corns and callosities: Secondary | ICD-10-CM | POA: Diagnosis not present

## 2011-11-04 DIAGNOSIS — H543 Unqualified visual loss, both eyes: Secondary | ICD-10-CM | POA: Diagnosis not present

## 2011-11-04 DIAGNOSIS — R262 Difficulty in walking, not elsewhere classified: Secondary | ICD-10-CM | POA: Diagnosis not present

## 2011-11-04 DIAGNOSIS — F70 Mild intellectual disabilities: Secondary | ICD-10-CM | POA: Diagnosis not present

## 2011-11-04 DIAGNOSIS — I10 Essential (primary) hypertension: Secondary | ICD-10-CM | POA: Diagnosis not present

## 2011-11-04 DIAGNOSIS — L84 Corns and callosities: Secondary | ICD-10-CM | POA: Diagnosis not present

## 2011-11-04 DIAGNOSIS — Z4800 Encounter for change or removal of nonsurgical wound dressing: Secondary | ICD-10-CM | POA: Diagnosis not present

## 2011-11-06 DIAGNOSIS — R262 Difficulty in walking, not elsewhere classified: Secondary | ICD-10-CM | POA: Diagnosis not present

## 2011-11-06 DIAGNOSIS — I10 Essential (primary) hypertension: Secondary | ICD-10-CM | POA: Diagnosis not present

## 2011-11-06 DIAGNOSIS — F70 Mild intellectual disabilities: Secondary | ICD-10-CM | POA: Diagnosis not present

## 2011-11-06 DIAGNOSIS — H543 Unqualified visual loss, both eyes: Secondary | ICD-10-CM | POA: Diagnosis not present

## 2011-11-06 DIAGNOSIS — Z4800 Encounter for change or removal of nonsurgical wound dressing: Secondary | ICD-10-CM | POA: Diagnosis not present

## 2011-11-06 DIAGNOSIS — L84 Corns and callosities: Secondary | ICD-10-CM | POA: Diagnosis not present

## 2011-11-09 DIAGNOSIS — H543 Unqualified visual loss, both eyes: Secondary | ICD-10-CM | POA: Diagnosis not present

## 2011-11-09 DIAGNOSIS — R262 Difficulty in walking, not elsewhere classified: Secondary | ICD-10-CM | POA: Diagnosis not present

## 2011-11-09 DIAGNOSIS — Z4800 Encounter for change or removal of nonsurgical wound dressing: Secondary | ICD-10-CM | POA: Diagnosis not present

## 2011-11-09 DIAGNOSIS — L84 Corns and callosities: Secondary | ICD-10-CM | POA: Diagnosis not present

## 2011-11-09 DIAGNOSIS — I10 Essential (primary) hypertension: Secondary | ICD-10-CM | POA: Diagnosis not present

## 2011-11-09 DIAGNOSIS — F70 Mild intellectual disabilities: Secondary | ICD-10-CM | POA: Diagnosis not present

## 2011-11-11 DIAGNOSIS — F70 Mild intellectual disabilities: Secondary | ICD-10-CM | POA: Diagnosis not present

## 2011-11-11 DIAGNOSIS — I10 Essential (primary) hypertension: Secondary | ICD-10-CM | POA: Diagnosis not present

## 2011-11-11 DIAGNOSIS — R262 Difficulty in walking, not elsewhere classified: Secondary | ICD-10-CM | POA: Diagnosis not present

## 2011-11-11 DIAGNOSIS — L84 Corns and callosities: Secondary | ICD-10-CM | POA: Diagnosis not present

## 2011-11-11 DIAGNOSIS — Z4800 Encounter for change or removal of nonsurgical wound dressing: Secondary | ICD-10-CM | POA: Diagnosis not present

## 2011-11-11 DIAGNOSIS — H543 Unqualified visual loss, both eyes: Secondary | ICD-10-CM | POA: Diagnosis not present

## 2011-11-13 DIAGNOSIS — F70 Mild intellectual disabilities: Secondary | ICD-10-CM | POA: Diagnosis not present

## 2011-11-13 DIAGNOSIS — L84 Corns and callosities: Secondary | ICD-10-CM | POA: Diagnosis not present

## 2011-11-13 DIAGNOSIS — R262 Difficulty in walking, not elsewhere classified: Secondary | ICD-10-CM | POA: Diagnosis not present

## 2011-11-13 DIAGNOSIS — Z4800 Encounter for change or removal of nonsurgical wound dressing: Secondary | ICD-10-CM | POA: Diagnosis not present

## 2011-11-13 DIAGNOSIS — H543 Unqualified visual loss, both eyes: Secondary | ICD-10-CM | POA: Diagnosis not present

## 2011-11-13 DIAGNOSIS — I10 Essential (primary) hypertension: Secondary | ICD-10-CM | POA: Diagnosis not present

## 2011-11-16 DIAGNOSIS — I10 Essential (primary) hypertension: Secondary | ICD-10-CM | POA: Diagnosis not present

## 2011-11-16 DIAGNOSIS — F70 Mild intellectual disabilities: Secondary | ICD-10-CM | POA: Diagnosis not present

## 2011-11-16 DIAGNOSIS — H543 Unqualified visual loss, both eyes: Secondary | ICD-10-CM | POA: Diagnosis not present

## 2011-11-16 DIAGNOSIS — R262 Difficulty in walking, not elsewhere classified: Secondary | ICD-10-CM | POA: Diagnosis not present

## 2011-11-16 DIAGNOSIS — L84 Corns and callosities: Secondary | ICD-10-CM | POA: Diagnosis not present

## 2011-11-16 DIAGNOSIS — Z4800 Encounter for change or removal of nonsurgical wound dressing: Secondary | ICD-10-CM | POA: Diagnosis not present

## 2011-11-18 DIAGNOSIS — I10 Essential (primary) hypertension: Secondary | ICD-10-CM | POA: Diagnosis not present

## 2011-11-18 DIAGNOSIS — F70 Mild intellectual disabilities: Secondary | ICD-10-CM | POA: Diagnosis not present

## 2011-11-18 DIAGNOSIS — Z4800 Encounter for change or removal of nonsurgical wound dressing: Secondary | ICD-10-CM | POA: Diagnosis not present

## 2011-11-18 DIAGNOSIS — H543 Unqualified visual loss, both eyes: Secondary | ICD-10-CM | POA: Diagnosis not present

## 2011-11-18 DIAGNOSIS — L84 Corns and callosities: Secondary | ICD-10-CM | POA: Diagnosis not present

## 2011-11-18 DIAGNOSIS — R262 Difficulty in walking, not elsewhere classified: Secondary | ICD-10-CM | POA: Diagnosis not present

## 2011-11-20 DIAGNOSIS — F70 Mild intellectual disabilities: Secondary | ICD-10-CM | POA: Diagnosis not present

## 2011-11-20 DIAGNOSIS — Z4800 Encounter for change or removal of nonsurgical wound dressing: Secondary | ICD-10-CM | POA: Diagnosis not present

## 2011-11-20 DIAGNOSIS — I10 Essential (primary) hypertension: Secondary | ICD-10-CM | POA: Diagnosis not present

## 2011-11-20 DIAGNOSIS — L84 Corns and callosities: Secondary | ICD-10-CM | POA: Diagnosis not present

## 2011-11-20 DIAGNOSIS — H543 Unqualified visual loss, both eyes: Secondary | ICD-10-CM | POA: Diagnosis not present

## 2011-11-20 DIAGNOSIS — R262 Difficulty in walking, not elsewhere classified: Secondary | ICD-10-CM | POA: Diagnosis not present

## 2011-11-21 DIAGNOSIS — R3981 Functional urinary incontinence: Secondary | ICD-10-CM | POA: Diagnosis not present

## 2011-11-21 DIAGNOSIS — L89899 Pressure ulcer of other site, unspecified stage: Secondary | ICD-10-CM | POA: Diagnosis not present

## 2011-11-21 DIAGNOSIS — L84 Corns and callosities: Secondary | ICD-10-CM | POA: Diagnosis not present

## 2011-11-21 DIAGNOSIS — I10 Essential (primary) hypertension: Secondary | ICD-10-CM | POA: Diagnosis not present

## 2011-11-21 DIAGNOSIS — L8992 Pressure ulcer of unspecified site, stage 2: Secondary | ICD-10-CM | POA: Diagnosis not present

## 2011-11-21 DIAGNOSIS — L8995 Pressure ulcer of unspecified site, unstageable: Secondary | ICD-10-CM | POA: Diagnosis not present

## 2011-11-21 DIAGNOSIS — F70 Mild intellectual disabilities: Secondary | ICD-10-CM | POA: Diagnosis not present

## 2011-11-24 DIAGNOSIS — F70 Mild intellectual disabilities: Secondary | ICD-10-CM | POA: Diagnosis not present

## 2011-11-24 DIAGNOSIS — L8992 Pressure ulcer of unspecified site, stage 2: Secondary | ICD-10-CM | POA: Diagnosis not present

## 2011-11-24 DIAGNOSIS — L89899 Pressure ulcer of other site, unspecified stage: Secondary | ICD-10-CM | POA: Diagnosis not present

## 2011-11-24 DIAGNOSIS — L8995 Pressure ulcer of unspecified site, unstageable: Secondary | ICD-10-CM | POA: Diagnosis not present

## 2011-11-24 DIAGNOSIS — L84 Corns and callosities: Secondary | ICD-10-CM | POA: Diagnosis not present

## 2011-11-24 DIAGNOSIS — I10 Essential (primary) hypertension: Secondary | ICD-10-CM | POA: Diagnosis not present

## 2011-11-30 DIAGNOSIS — I10 Essential (primary) hypertension: Secondary | ICD-10-CM | POA: Diagnosis not present

## 2011-11-30 DIAGNOSIS — L89899 Pressure ulcer of other site, unspecified stage: Secondary | ICD-10-CM | POA: Diagnosis not present

## 2011-11-30 DIAGNOSIS — L8995 Pressure ulcer of unspecified site, unstageable: Secondary | ICD-10-CM | POA: Diagnosis not present

## 2011-11-30 DIAGNOSIS — L8992 Pressure ulcer of unspecified site, stage 2: Secondary | ICD-10-CM | POA: Diagnosis not present

## 2011-11-30 DIAGNOSIS — L84 Corns and callosities: Secondary | ICD-10-CM | POA: Diagnosis not present

## 2011-11-30 DIAGNOSIS — F70 Mild intellectual disabilities: Secondary | ICD-10-CM | POA: Diagnosis not present

## 2011-12-03 DIAGNOSIS — L84 Corns and callosities: Secondary | ICD-10-CM | POA: Diagnosis not present

## 2011-12-03 DIAGNOSIS — L8992 Pressure ulcer of unspecified site, stage 2: Secondary | ICD-10-CM | POA: Diagnosis not present

## 2011-12-03 DIAGNOSIS — F70 Mild intellectual disabilities: Secondary | ICD-10-CM | POA: Diagnosis not present

## 2011-12-03 DIAGNOSIS — I10 Essential (primary) hypertension: Secondary | ICD-10-CM | POA: Diagnosis not present

## 2011-12-03 DIAGNOSIS — L89899 Pressure ulcer of other site, unspecified stage: Secondary | ICD-10-CM | POA: Diagnosis not present

## 2011-12-03 DIAGNOSIS — L8995 Pressure ulcer of unspecified site, unstageable: Secondary | ICD-10-CM | POA: Diagnosis not present

## 2011-12-08 DIAGNOSIS — L8992 Pressure ulcer of unspecified site, stage 2: Secondary | ICD-10-CM | POA: Diagnosis not present

## 2011-12-08 DIAGNOSIS — L84 Corns and callosities: Secondary | ICD-10-CM | POA: Diagnosis not present

## 2011-12-08 DIAGNOSIS — F70 Mild intellectual disabilities: Secondary | ICD-10-CM | POA: Diagnosis not present

## 2011-12-08 DIAGNOSIS — L8995 Pressure ulcer of unspecified site, unstageable: Secondary | ICD-10-CM | POA: Diagnosis not present

## 2011-12-08 DIAGNOSIS — I10 Essential (primary) hypertension: Secondary | ICD-10-CM | POA: Diagnosis not present

## 2011-12-08 DIAGNOSIS — L89899 Pressure ulcer of other site, unspecified stage: Secondary | ICD-10-CM | POA: Diagnosis not present

## 2011-12-19 DIAGNOSIS — I10 Essential (primary) hypertension: Secondary | ICD-10-CM | POA: Diagnosis not present

## 2011-12-19 DIAGNOSIS — L8992 Pressure ulcer of unspecified site, stage 2: Secondary | ICD-10-CM | POA: Diagnosis not present

## 2011-12-19 DIAGNOSIS — L84 Corns and callosities: Secondary | ICD-10-CM | POA: Diagnosis not present

## 2011-12-19 DIAGNOSIS — L89899 Pressure ulcer of other site, unspecified stage: Secondary | ICD-10-CM | POA: Diagnosis not present

## 2011-12-19 DIAGNOSIS — F70 Mild intellectual disabilities: Secondary | ICD-10-CM | POA: Diagnosis not present

## 2011-12-19 DIAGNOSIS — L8995 Pressure ulcer of unspecified site, unstageable: Secondary | ICD-10-CM | POA: Diagnosis not present

## 2011-12-24 DIAGNOSIS — L84 Corns and callosities: Secondary | ICD-10-CM | POA: Diagnosis not present

## 2011-12-24 DIAGNOSIS — F70 Mild intellectual disabilities: Secondary | ICD-10-CM | POA: Diagnosis not present

## 2011-12-24 DIAGNOSIS — L89899 Pressure ulcer of other site, unspecified stage: Secondary | ICD-10-CM | POA: Diagnosis not present

## 2011-12-24 DIAGNOSIS — I10 Essential (primary) hypertension: Secondary | ICD-10-CM | POA: Diagnosis not present

## 2011-12-24 DIAGNOSIS — L8995 Pressure ulcer of unspecified site, unstageable: Secondary | ICD-10-CM | POA: Diagnosis not present

## 2011-12-24 DIAGNOSIS — L8992 Pressure ulcer of unspecified site, stage 2: Secondary | ICD-10-CM | POA: Diagnosis not present

## 2011-12-31 DIAGNOSIS — L8992 Pressure ulcer of unspecified site, stage 2: Secondary | ICD-10-CM | POA: Diagnosis not present

## 2011-12-31 DIAGNOSIS — I10 Essential (primary) hypertension: Secondary | ICD-10-CM | POA: Diagnosis not present

## 2011-12-31 DIAGNOSIS — F70 Mild intellectual disabilities: Secondary | ICD-10-CM | POA: Diagnosis not present

## 2011-12-31 DIAGNOSIS — L8995 Pressure ulcer of unspecified site, unstageable: Secondary | ICD-10-CM | POA: Diagnosis not present

## 2011-12-31 DIAGNOSIS — L84 Corns and callosities: Secondary | ICD-10-CM | POA: Diagnosis not present

## 2011-12-31 DIAGNOSIS — L89899 Pressure ulcer of other site, unspecified stage: Secondary | ICD-10-CM | POA: Diagnosis not present

## 2012-01-06 DIAGNOSIS — I10 Essential (primary) hypertension: Secondary | ICD-10-CM | POA: Diagnosis not present

## 2012-01-06 DIAGNOSIS — L84 Corns and callosities: Secondary | ICD-10-CM | POA: Diagnosis not present

## 2012-01-13 ENCOUNTER — Emergency Department (HOSPITAL_COMMUNITY): Payer: Medicare Other

## 2012-01-13 ENCOUNTER — Encounter (HOSPITAL_COMMUNITY): Payer: Self-pay | Admitting: Radiology

## 2012-01-13 ENCOUNTER — Inpatient Hospital Stay (HOSPITAL_COMMUNITY)
Admission: EM | Admit: 2012-01-13 | Discharge: 2012-01-15 | DRG: 092 | Disposition: A | Discharge status: Nursing Home (Includes ICF) | Payer: Medicare Other | Attending: Internal Medicine | Admitting: Internal Medicine

## 2012-01-13 ENCOUNTER — Other Ambulatory Visit: Payer: Self-pay

## 2012-01-13 DIAGNOSIS — G459 Transient cerebral ischemic attack, unspecified: Secondary | ICD-10-CM | POA: Diagnosis present

## 2012-01-13 DIAGNOSIS — Z7982 Long term (current) use of aspirin: Secondary | ICD-10-CM | POA: Diagnosis not present

## 2012-01-13 DIAGNOSIS — R4182 Altered mental status, unspecified: Secondary | ICD-10-CM

## 2012-01-13 DIAGNOSIS — R29818 Other symptoms and signs involving the nervous system: Secondary | ICD-10-CM | POA: Diagnosis not present

## 2012-01-13 DIAGNOSIS — E538 Deficiency of other specified B group vitamins: Secondary | ICD-10-CM | POA: Diagnosis present

## 2012-01-13 DIAGNOSIS — M79609 Pain in unspecified limb: Secondary | ICD-10-CM | POA: Diagnosis not present

## 2012-01-13 DIAGNOSIS — R631 Polydipsia: Secondary | ICD-10-CM | POA: Diagnosis present

## 2012-01-13 DIAGNOSIS — G8190 Hemiplegia, unspecified affecting unspecified side: Secondary | ICD-10-CM | POA: Diagnosis not present

## 2012-01-13 DIAGNOSIS — R269 Unspecified abnormalities of gait and mobility: Secondary | ICD-10-CM | POA: Diagnosis present

## 2012-01-13 DIAGNOSIS — I10 Essential (primary) hypertension: Secondary | ICD-10-CM | POA: Diagnosis not present

## 2012-01-13 DIAGNOSIS — E871 Hypo-osmolality and hyponatremia: Secondary | ICD-10-CM | POA: Diagnosis not present

## 2012-01-13 DIAGNOSIS — N400 Enlarged prostate without lower urinary tract symptoms: Secondary | ICD-10-CM | POA: Diagnosis present

## 2012-01-13 DIAGNOSIS — F79 Unspecified intellectual disabilities: Secondary | ICD-10-CM | POA: Diagnosis present

## 2012-01-13 DIAGNOSIS — F54 Psychological and behavioral factors associated with disorders or diseases classified elsewhere: Secondary | ICD-10-CM | POA: Diagnosis present

## 2012-01-13 DIAGNOSIS — G319 Degenerative disease of nervous system, unspecified: Secondary | ICD-10-CM | POA: Diagnosis not present

## 2012-01-13 DIAGNOSIS — G40909 Epilepsy, unspecified, not intractable, without status epilepticus: Secondary | ICD-10-CM | POA: Diagnosis present

## 2012-01-13 DIAGNOSIS — M7989 Other specified soft tissue disorders: Secondary | ICD-10-CM | POA: Diagnosis not present

## 2012-01-13 DIAGNOSIS — R6250 Unspecified lack of expected normal physiological development in childhood: Secondary | ICD-10-CM | POA: Diagnosis present

## 2012-01-13 DIAGNOSIS — I669 Occlusion and stenosis of unspecified cerebral artery: Secondary | ICD-10-CM | POA: Diagnosis not present

## 2012-01-13 DIAGNOSIS — M19079 Primary osteoarthritis, unspecified ankle and foot: Secondary | ICD-10-CM | POA: Diagnosis not present

## 2012-01-13 DIAGNOSIS — R29810 Facial weakness: Principal | ICD-10-CM | POA: Diagnosis present

## 2012-01-13 DIAGNOSIS — R262 Difficulty in walking, not elsewhere classified: Secondary | ICD-10-CM | POA: Diagnosis present

## 2012-01-13 DIAGNOSIS — I119 Hypertensive heart disease without heart failure: Secondary | ICD-10-CM | POA: Diagnosis not present

## 2012-01-13 DIAGNOSIS — I6789 Other cerebrovascular disease: Secondary | ICD-10-CM | POA: Diagnosis not present

## 2012-01-13 DIAGNOSIS — R270 Ataxia, unspecified: Secondary | ICD-10-CM

## 2012-01-13 DIAGNOSIS — J329 Chronic sinusitis, unspecified: Secondary | ICD-10-CM | POA: Diagnosis not present

## 2012-01-13 HISTORY — DX: Benign prostatic hyperplasia without lower urinary tract symptoms: N40.0

## 2012-01-13 HISTORY — DX: Polydipsia: R63.1

## 2012-01-13 HISTORY — DX: Altered mental status, unspecified: R41.82

## 2012-01-13 HISTORY — DX: Vitamin B12 deficiency anemia, unspecified: D51.9

## 2012-01-13 HISTORY — DX: Pneumonia, unspecified organism: J18.9

## 2012-01-13 HISTORY — DX: Psychological and behavioral factors associated with disorders or diseases classified elsewhere: F54

## 2012-01-13 LAB — URINE MICROSCOPIC-ADD ON

## 2012-01-13 LAB — DIFFERENTIAL
Eosinophils Relative: 2 % (ref 0–5)
Lymphocytes Relative: 14 % (ref 12–46)
Lymphs Abs: 1 10*3/uL (ref 0.7–4.0)
Monocytes Absolute: 0.9 10*3/uL (ref 0.1–1.0)
Monocytes Relative: 13 % — ABNORMAL HIGH (ref 3–12)
Neutro Abs: 5.1 10*3/uL (ref 1.7–7.7)

## 2012-01-13 LAB — COMPREHENSIVE METABOLIC PANEL
AST: 20 U/L (ref 0–37)
CO2: 27 mEq/L (ref 19–32)
Calcium: 9.6 mg/dL (ref 8.4–10.5)
Creatinine, Ser: 0.92 mg/dL (ref 0.50–1.35)
GFR calc Af Amer: >90 mL/min (ref >90)
GFR calc non Af Amer: 79 mL/min — ABNORMAL LOW (ref >90)
Total Protein: 7.6 g/dL (ref 6.0–8.3)

## 2012-01-13 LAB — CBC
HCT: 41.8 % (ref 39.0–52.0)
Hemoglobin: 14.7 g/dL (ref 13.0–17.0)
MCV: 89.7 fL (ref 78.0–100.0)
WBC: 7.2 10*3/uL (ref 4.0–10.5)

## 2012-01-13 LAB — URINALYSIS, ROUTINE W REFLEX MICROSCOPIC
Glucose, UA: NEGATIVE mg/dL
Ketones, ur: NEGATIVE mg/dL
Leukocytes, UA: NEGATIVE
Protein, ur: NEGATIVE mg/dL
Urobilinogen, UA: 1 mg/dL (ref 0.0–1.0)

## 2012-01-13 MED ORDER — SODIUM CHLORIDE 0.9 % IV SOLN
INTRAVENOUS | Status: DC
  Administered 2012-01-13 (×2): via INTRAVENOUS

## 2012-01-13 MED ORDER — ACETAMINOPHEN 325 MG PO TABS: 650.0000 mg | ORAL_TABLET | Freq: Four times a day (QID) | ORAL | Status: DC | PRN

## 2012-01-13 MED ORDER — ENOXAPARIN SODIUM 40 MG/0.4ML ~~LOC~~ SOLN
40.0000 mg | SUBCUTANEOUS | Status: DC
  Administered 2012-01-14 – 2012-01-15 (×2): 40 mg via SUBCUTANEOUS
  Filled 2012-01-13 (×2): qty 0.4

## 2012-01-13 MED ORDER — TAMSULOSIN HCL 0.4 MG PO CAPS
0.4000 mg | ORAL_CAPSULE | Freq: Every day | ORAL | Status: DC
  Administered 2012-01-14 – 2012-01-15 (×2): 0.4 mg via ORAL
  Filled 2012-01-13 (×2): qty 1

## 2012-01-13 MED ORDER — LISINOPRIL 10 MG PO TABS
10.0000 mg | ORAL_TABLET | Freq: Every day | ORAL | Status: DC
  Administered 2012-01-14 – 2012-01-15 (×2): 10 mg via ORAL
  Filled 2012-01-13 (×2): qty 1

## 2012-01-13 MED ORDER — METOPROLOL TARTRATE 50 MG PO TABS
50.0000 mg | ORAL_TABLET | Freq: Two times a day (BID) | ORAL | Status: DC
  Administered 2012-01-14 – 2012-01-15 (×3): 50 mg via ORAL
  Filled 2012-01-13 (×4): qty 1

## 2012-01-13 MED ORDER — ASPIRIN 325 MG PO TABS
325.0000 mg | ORAL_TABLET | Freq: Every day | ORAL | Status: DC
  Administered 2012-01-14 – 2012-01-15 (×2): 325 mg via ORAL
  Filled 2012-01-13 (×2): qty 1

## 2012-01-13 NOTE — ED Notes (Signed)
Patient back from radiology.

## 2012-01-13 NOTE — H&P (Signed)
Jesus Ramsey is an 78 y.o. male.   PCP - Dr.Robert Johnella Moloney. Chief Complaint: Right facial droop. HPI: 78 year-old male with history of hypertension, chronic hyponatremia, mental retardation was brought to the ER because the nurse at the facility found that the patient had right facial droop at around 1 PM today. In the ER patient in addition was found to have difficulty walking gait unstable gait. MRI of the brain done in the ER did not show any acute. Patient has been admitted for further observation. I did discuss the patient's family. Patient's sister-in-law stated that he has been having some difficulty walking because of some problems with the foot for last few weeks. He has not complained of any chest pain or shortness of breath no nausea or vomiting or abdominal pain. Has not had any problem seeing or speaking or difficulty swallowing. He is able to move his upper extremity is without any difficulty.  Past Medical History  Diagnosis Date  . Mental retardation   . Hypertension   . Seizures   . Pneumonia   . Psychogenic polydipsia   . B12 deficiency anemia   . Cataract   . Altered mental status   . BPH (benign prostatic hypertrophy)     History reviewed. No pertinent past surgical history.  History reviewed. No pertinent family history. Social History:  reports that he has quit smoking. He does not have any smokeless tobacco history on file. He reports that he does not drink alcohol or use illicit drugs.  Allergies: No Known Allergies  Medications Prior to Admission  Medication Dose Route Frequency Provider Last Rate Last Dose  . 0.9 %  sodium chloride infusion   Intravenous Continuous Laray Anger, DO       Medications Prior to Admission  Medication Sig Dispense Refill  . acetaminophen (TYLENOL) 325 MG tablet Take 650 mg by mouth every 6 (six) hours as needed. pain      . lisinopril (PRINIVIL,ZESTRIL) 10 MG tablet Take 10 mg by mouth daily.       . metoprolol  (LOPRESSOR) 50 MG tablet Take 50 mg by mouth 2 (two) times daily.       . Tamsulosin HCl (FLOMAX) 0.4 MG CAPS Take 0.4 mg by mouth daily.         Results for orders placed during the hospital encounter of 01/13/12 (from the past 48 hour(s))  URINALYSIS, ROUTINE W REFLEX MICROSCOPIC     Status: Abnormal   Collection Time   01/13/12  5:05 PM      Component Value Range Comment   Color, Urine YELLOW  YELLOW     APPearance CLEAR  CLEAR     Specific Gravity, Urine 1.022  1.005 - 1.030     pH 6.0  5.0 - 8.0     Glucose, UA NEGATIVE  NEGATIVE (mg/dL)    Hgb urine dipstick SMALL (*) NEGATIVE     Bilirubin Urine NEGATIVE  NEGATIVE     Ketones, ur NEGATIVE  NEGATIVE (mg/dL)    Protein, ur NEGATIVE  NEGATIVE (mg/dL)    Urobilinogen, UA 1.0  0.0 - 1.0 (mg/dL)    Nitrite NEGATIVE  NEGATIVE     Leukocytes, UA NEGATIVE  NEGATIVE    URINE MICROSCOPIC-ADD ON     Status: Normal   Collection Time   01/13/12  5:05 PM      Component Value Range Comment   Squamous Epithelial / LPF RARE  RARE     WBC, UA 0-2  <  3 (WBC/hpf)    RBC / HPF 3-6  <3 (RBC/hpf)    Bacteria, UA RARE  RARE    CBC     Status: Normal   Collection Time   01/13/12  5:28 PM      Component Value Range Comment   WBC 7.2  4.0 - 10.5 (K/uL)    RBC 4.66  4.22 - 5.81 (MIL/uL)    Hemoglobin 14.7  13.0 - 17.0 (g/dL)    HCT 53.6  64.4 - 03.4 (%)    MCV 89.7  78.0 - 100.0 (fL)    MCH 31.5  26.0 - 34.0 (pg)    MCHC 35.2  30.0 - 36.0 (g/dL)    RDW 74.2  59.5 - 63.8 (%)    Platelets 172  150 - 400 (K/uL)   DIFFERENTIAL     Status: Abnormal   Collection Time   01/13/12  5:28 PM      Component Value Range Comment   Neutrophils Relative 71  43 - 77 (%)    Neutro Abs 5.1  1.7 - 7.7 (K/uL)    Lymphocytes Relative 14  12 - 46 (%)    Lymphs Abs 1.0  0.7 - 4.0 (K/uL)    Monocytes Relative 13 (*) 3 - 12 (%)    Monocytes Absolute 0.9  0.1 - 1.0 (K/uL)    Eosinophils Relative 2  0 - 5 (%)    Eosinophils Absolute 0.1  0.0 - 0.7 (K/uL)    Basophils  Relative 0  0 - 1 (%)    Basophils Absolute 0.0  0.0 - 0.1 (K/uL)   COMPREHENSIVE METABOLIC PANEL     Status: Abnormal   Collection Time   01/13/12  5:28 PM      Component Value Range Comment   Sodium 130 (*) 135 - 145 (mEq/L)    Potassium 4.1  3.5 - 5.1 (mEq/L)    Chloride 92 (*) 96 - 112 (mEq/L)    CO2 27  19 - 32 (mEq/L)    Glucose, Bld 86  70 - 99 (mg/dL)    BUN 15  6 - 23 (mg/dL)    Creatinine, Ser 7.56  0.50 - 1.35 (mg/dL)    Calcium 9.6  8.4 - 10.5 (mg/dL)    Total Protein 7.6  6.0 - 8.3 (g/dL)    Albumin 3.6  3.5 - 5.2 (g/dL)    AST 20  0 - 37 (U/L) HEMOLYSIS AT THIS LEVEL MAY AFFECT RESULT   ALT 18  0 - 53 (U/L)    Alkaline Phosphatase 73  39 - 117 (U/L)    Total Bilirubin 0.4  0.3 - 1.2 (mg/dL)    GFR calc non Af Amer 79 (*) >90 (mL/min)    GFR calc Af Amer >90  >90 (mL/min)   TROPONIN I     Status: Normal   Collection Time   01/13/12  5:28 PM      Component Value Range Comment   Troponin I <0.30  <0.30 (ng/mL)    Ct Head Wo Contrast  01/13/2012  *RADIOLOGY REPORT*  Clinical Data: Right facial droop.  CT HEAD WITHOUT CONTRAST  Technique:  Contiguous axial images were obtained from the base of the skull through the vertex without contrast.  Comparison: CT head without contrast 08/12/2011.  Findings: Mild generalized atrophy is similar to the prior exam. No acute cortical infarct, hemorrhage, mass lesion is present. Mild mucosal thickening is present in the left maxillary sinus. There is wall thickening, suggesting  a history of chronic sinus disease.  No fluid levels are present.  The mastoid air cells are clear.  Debris, likely cerumen, is present within the external auditory canal bilaterally.  IMPRESSION:  1.  No acute intracranial abnormality or significant interval change. 2.  Stable moderate atrophy. 3.  Wall thickening about the maxillary sinuses bilaterally, suggesting a history of chronic disease.  Original Report Authenticated By: Jamesetta Orleans. MATTERN, M.D.   Mr Brain Wo  Contrast  01/13/2012  *RADIOLOGY REPORT*  Clinical Data: Right facial droop.  MRI HEAD WITHOUT CONTRAST  Technique:  Multiplanar, multiecho pulse sequences of the brain and surrounding structures were obtained according to standard protocol without intravenous contrast.  Comparison: MRI 12/22/2009  Findings: Generalized atrophy with enlargement of the ventricles and subarachnoid space, unchanged.  Mild chronic microvascular ischemia in the white matter bilaterally.  Negative for acute infarct.  Negative for hemorrhage or mass lesion.  No midline shift.  Chronic sinusitis.  IMPRESSION: Atrophy and chronic ischemic change.  No acute infarct.  Chronic sinusitis.  Original Report Authenticated By: Camelia Phenes, M.D.    Review of Systems  Constitutional: Negative.   HENT: Negative.   Eyes: Negative.   Cardiovascular: Negative.   Gastrointestinal: Negative.   Genitourinary: Negative.   Skin: Negative.   Neurological:       Right facial droop and difficulty walking.  Psychiatric/Behavioral: Negative.     Blood pressure 140/97, pulse 101, temperature 98.5 F (36.9 C), temperature source Oral, resp. rate 21, SpO2 98.00%. Physical Exam  Constitutional: He appears well-developed and well-nourished. No distress.  HENT:  Head: Normocephalic and atraumatic.  Right Ear: External ear normal.  Left Ear: External ear normal.  Nose: Nose normal.  Mouth/Throat: Oropharynx is clear and moist. No oropharyngeal exudate.  Eyes: Conjunctivae are normal. Pupils are equal, round, and reactive to light. Right eye exhibits no discharge. Left eye exhibits no discharge. No scleral icterus.  Neck: Normal range of motion. Neck supple.  Cardiovascular:       Mild sinus tachycardia.  Respiratory: Effort normal and breath sounds normal. No respiratory distress. He has no wheezes. He has no rales.  GI: Soft. Bowel sounds are normal. He exhibits no distension. There is no tenderness.  Musculoskeletal: He exhibits no  edema and no tenderness.       Skin in the second toe of the left foot looks little bit macerated. There is no active discharge.  Neurological: He is alert.       Patient is oriented to name and place. But not date and time. He knows his PCP's name. Follows commands moves upper extremities 5/5. He has right facial droop mostly in the right lower half. Is able to seen both eyes and pupils are reacting to light. Tongue is midline.  Skin: Skin is warm. He is not diaphoretic.     Assessment/Plan #1. Right facial droop with difficulty walking - at this time his MRI brain is negative for anything acute. We will place patient on neuro checks and I have consulted neurologist for their opinion. I also ordered MRA brain with carotid Doppler and 2-D echo and neuro checks. His difficulty walking may be related to his foot problem. Have ordered x-ray of his left foot. #2. Chronic hyponatremia - we will closely follow his sodium. Patient did have a history of seizures from very severe hypernatremia in 2002. #3. Hypertension - continue present medications. His EKG does show some nonspecific ST changes particularly in the lateral leads, no old  EKG to compare. I am going to cycle cardiac markers and check 2-D echo. Patient has no chest pain. #4. Wound in his second toe of the left foot - check x-ray.  CODE STATUS - full code. I have left a message for Dr. Marden Noble about his admission.   Eduard Clos. 01/13/2012, 10:15 PM

## 2012-01-13 NOTE — ED Provider Notes (Signed)
History     CSN: [id number]  Arrival date & time 01/13/12  1628   First MD Initiated Contact with Patient 01/13/12 1631      Chief Complaint  Patient presents with  . Facial Droop    The history is provided by the nursing home, the EMS personnel and a relative. History Limited By: hx MR, AMS.  Pt was seen at 1645.  Per EMS, family, and NH report, pt with right sided facial droop this afternoon PTA approx 1330.  Pt was noted to have unsteady gait, and "not acting like himself" (ie: walking into the NH lobby and urinating).  EMS did not see facial droop on their arrival to scene, neuro exam intact en route to ED.  Pt himself states "I don't know why I'm here" and "I'm fine."   Past Medical History  Diagnosis Date  . Mental retardation   . Hypertension   . Seizures   . Pneumonia   . Psychogenic polydipsia   . B12 deficiency anemia   . Cataract   . Altered mental status   . BPH (benign prostatic hypertrophy)     History reviewed. No pertinent past surgical history.   History  Substance Use Topics  . Smoking status: Former Games developer  . Smokeless tobacco: Not on file  . Alcohol Use: No      Review of Systems  Unable to perform ROS: Other    Allergies  Review of patient's allergies indicates no known allergies.  Home Medications   Current Outpatient Rx  Name Route Sig Dispense Refill  . ACETAMINOPHEN 325 MG PO TABS Oral Take 650 mg by mouth every 6 (six) hours as needed. pain    . LISINOPRIL 10 MG PO TABS Oral Take 10 mg by mouth daily.     Marland Kitchen METOPROLOL TARTRATE 50 MG PO TABS Oral Take 50 mg by mouth 2 (two) times daily.     Marland Kitchen TAMSULOSIN HCL 0.4 MG PO CAPS Oral Take 0.4 mg by mouth daily.       BP 134/82  Pulse 102  Temp(Src) 98.5 F (36.9 C) (Oral)  Resp 20  SpO2 100%  Physical Exam 1650: Physical examination:  Nursing notes reviewed; Vital signs and O2 SAT reviewed;  Constitutional:  Thin, In no acute distress; Head:  Normocephalic, atraumatic; Eyes: EOMI,  PERRL, No scleral icterus; ENMT: Mouth and pharynx normal, poor dentition, Mucous membranes dry; Neck: Supple, Full range of motion, No lymphadenopathy; Cardiovascular: Regular rate and rhythm, No murmur or gallop; Respiratory: Breath sounds clear & equal bilaterally, No wheezes, Normal respiratory effort/excursion; Chest: Nontender, Movement normal; Abdomen: Soft, Nontender, Nondistended, Normal bowel sounds; Extremities: Pulses normal, No tenderness, No edema, No calf edema or asymmetry.; Neuro: Awake, alert, confused re: time, place, events.  No facial droop, speech clear. Major CN grossly intact.  Strength 5/5 equal bilat UE's and LE's.  DTR 2/4 equal bilat UE's and LE's.  No gross sensory deficits.  Normal cerebellar testing bilat UE's and LE's.; Skin: Color normal, Warm, Dry, no rash.    ED Course  Procedures   2000:  Pt ambulates with shuffling gait and heavy assistance by ED RN.  Family member at bedside states this is not pt's usual gait; he does not walk with a cane or a walker.  No facial droop at this time.  Confused, but this appears to be at baseline.  T/C to [facility] Dr. Toniann Fail, case discussed, including:  HPI, pertinent PM/SHx, VS/PE, dx testing, ED course and treatment:  Agreeable to admit, requests to obtain tele bed to team 3.      MDM  MDM Reviewed: nursing note, vitals and previous chart Reviewed previous: ECG Interpretation: ECG, labs, x-ray, MRI and CT scan      Date: 01/13/2012  Rate: 104  Rhythm: sinus tachycardia  QRS Axis: normal  Intervals: normal  ST/T Wave abnormalities: normal  Conduction Disutrbances:none  Narrative Interpretation:   Old EKG Reviewed: unchanged; no significant changes from previous EKG dated 12/21/2009.  Results for orders placed during the hospital encounter of 01/13/12  CBC      Component Value Range   WBC 7.2  4.0 - 10.5 (K/uL)   RBC 4.66  4.22 - 5.81 (MIL/uL)   Hemoglobin 14.7  13.0 - 17.0 (g/dL)   HCT 16.1  09.6 - 04.5 (%)    MCV 89.7  78.0 - 100.0 (fL)   MCH 31.5  26.0 - 34.0 (pg)   MCHC 35.2  30.0 - 36.0 (g/dL)   RDW 40.9  81.1 - 91.4 (%)   Platelets 172  150 - 400 (K/uL)  DIFFERENTIAL      Component Value Range   Neutrophils Relative 71  43 - 77 (%)   Neutro Abs 5.1  1.7 - 7.7 (K/uL)   Lymphocytes Relative 14  12 - 46 (%)   Lymphs Abs 1.0  0.7 - 4.0 (K/uL)   Monocytes Relative 13 (*) 3 - 12 (%)   Monocytes Absolute 0.9  0.1 - 1.0 (K/uL)   Eosinophils Relative 2  0 - 5 (%)   Eosinophils Absolute 0.1  0.0 - 0.7 (K/uL)   Basophils Relative 0  0 - 1 (%)   Basophils Absolute 0.0  0.0 - 0.1 (K/uL)  URINALYSIS, ROUTINE W REFLEX MICROSCOPIC      Component Value Range   Color, Urine YELLOW  YELLOW    APPearance CLEAR  CLEAR    Specific Gravity, Urine 1.022  1.005 - 1.030    pH 6.0  5.0 - 8.0    Glucose, UA NEGATIVE  NEGATIVE (mg/dL)   Hgb urine dipstick SMALL (*) NEGATIVE    Bilirubin Urine NEGATIVE  NEGATIVE    Ketones, ur NEGATIVE  NEGATIVE (mg/dL)   Protein, ur NEGATIVE  NEGATIVE (mg/dL)   Urobilinogen, UA 1.0  0.0 - 1.0 (mg/dL)   Nitrite NEGATIVE  NEGATIVE    Leukocytes, UA NEGATIVE  NEGATIVE   COMPREHENSIVE METABOLIC PANEL      Component Value Range   Sodium 130 (*) 135 - 145 (mEq/L)   Potassium 4.1  3.5 - 5.1 (mEq/L)   Chloride 92 (*) 96 - 112 (mEq/L)   CO2 27  19 - 32 (mEq/L)   Glucose, Bld 86  70 - 99 (mg/dL)   BUN 15  6 - 23 (mg/dL)   Creatinine, Ser 7.82  0.50 - 1.35 (mg/dL)   Calcium 9.6  8.4 - 95.6 (mg/dL)   Total Protein 7.6  6.0 - 8.3 (g/dL)   Albumin 3.6  3.5 - 5.2 (g/dL)   AST 20  0 - 37 (U/L)   ALT 18  0 - 53 (U/L)   Alkaline Phosphatase 73  39 - 117 (U/L)   Total Bilirubin 0.4  0.3 - 1.2 (mg/dL)   GFR calc non Af Amer 79 (*) >90 (mL/min)   GFR calc Af Amer >90  >90 (mL/min)  TROPONIN I      Component Value Range   Troponin I <0.30  <0.30 (ng/mL)  URINE MICROSCOPIC-ADD ON  Component Value Range   Squamous Epithelial / LPF RARE  RARE    WBC, UA 0-2  <3 (WBC/hpf)   RBC  / HPF 3-6  <3 (RBC/hpf)   Bacteria, UA RARE  RARE    Ct Head Wo Contrast 01/13/2012  *RADIOLOGY REPORT*  Clinical Data: Right facial droop.  CT HEAD WITHOUT CONTRAST  Technique:  Contiguous axial images were obtained from the base of the skull through the vertex without contrast.  Comparison: CT head without contrast 08/12/2011.  Findings: Mild generalized atrophy is similar to the prior exam. No acute cortical infarct, hemorrhage, mass lesion is present. Mild mucosal thickening is present in the left maxillary sinus. There is wall thickening, suggesting a history of chronic sinus disease.  No fluid levels are present.  The mastoid air cells are clear.  Debris, likely cerumen, is present within the external auditory canal bilaterally.  IMPRESSION:  1.  No acute intracranial abnormality or significant interval change. 2.  Stable moderate atrophy. 3.  Wall thickening about the maxillary sinuses bilaterally, suggesting a history of chronic disease.  Original Report Authenticated By: Jamesetta Orleans. MATTERN, M.D.   Mr Brain Wo Contrast 01/13/2012  *RADIOLOGY REPORT*  Clinical Data: Right facial droop.  MRI HEAD WITHOUT CONTRAST  Technique:  Multiplanar, multiecho pulse sequences of the brain and surrounding structures were obtained according to standard protocol without intravenous contrast.  Comparison: MRI 12/22/2009  Findings: Generalized atrophy with enlargement of the ventricles and subarachnoid space, unchanged.  Mild chronic microvascular ischemia in the white matter bilaterally.  Negative for acute infarct.  Negative for hemorrhage or mass lesion.  No midline shift.  Chronic sinusitis.  IMPRESSION: Atrophy and chronic ischemic change.  No acute infarct.  Chronic sinusitis.  Original Report Authenticated By: Camelia Phenes, M.D.            Laray Anger, DO 01/14/12 1623

## 2012-01-13 NOTE — ED Notes (Signed)
Called pt's facility - Darrell Jewel RN states that patient had an onset on facial droop at 13:30, 1 episode of incontinence, tachycardia 140 and unsteady gait per norm. Pt stated to facility that patient had left foot pain. MD made aware

## 2012-01-13 NOTE — ED Notes (Signed)
[id number] Ready 

## 2012-01-13 NOTE — ED Notes (Signed)
Pt is from [facility].

## 2012-01-13 NOTE — ED Notes (Addendum)
MD at bedside. Dr.McManus evaluating patient. Walked patient few steps. Patient holding to my hand took small steps and wanted to hold to objects while he was walking. Family member stated that patient does not walker when he walks and this is not his normal. Dr.McManus will admit patient

## 2012-01-13 NOTE — ED Notes (Signed)
Pt via ems with noted rt lip swelling ?droop with unknown start time. Pt is alert and oriented. Grip strengths symmetrical. Pt is also ambulatory with standby assistance at baseline.

## 2012-01-14 ENCOUNTER — Inpatient Hospital Stay (HOSPITAL_COMMUNITY): Payer: Medicare Other

## 2012-01-14 DIAGNOSIS — M19079 Primary osteoarthritis, unspecified ankle and foot: Secondary | ICD-10-CM | POA: Diagnosis not present

## 2012-01-14 DIAGNOSIS — R269 Unspecified abnormalities of gait and mobility: Secondary | ICD-10-CM | POA: Diagnosis not present

## 2012-01-14 DIAGNOSIS — G459 Transient cerebral ischemic attack, unspecified: Secondary | ICD-10-CM

## 2012-01-14 DIAGNOSIS — M7989 Other specified soft tissue disorders: Secondary | ICD-10-CM | POA: Diagnosis not present

## 2012-01-14 DIAGNOSIS — F54 Psychological and behavioral factors associated with disorders or diseases classified elsewhere: Secondary | ICD-10-CM | POA: Diagnosis present

## 2012-01-14 DIAGNOSIS — E538 Deficiency of other specified B group vitamins: Secondary | ICD-10-CM | POA: Diagnosis present

## 2012-01-14 DIAGNOSIS — G8190 Hemiplegia, unspecified affecting unspecified side: Secondary | ICD-10-CM | POA: Diagnosis not present

## 2012-01-14 DIAGNOSIS — M79609 Pain in unspecified limb: Secondary | ICD-10-CM | POA: Diagnosis not present

## 2012-01-14 DIAGNOSIS — N400 Enlarged prostate without lower urinary tract symptoms: Secondary | ICD-10-CM | POA: Insufficient documentation

## 2012-01-14 HISTORY — DX: Deficiency of other specified B group vitamins: E53.8

## 2012-01-14 LAB — CBC
HCT: 39.8 % (ref 39.0–52.0)
Hemoglobin: 13.6 g/dL (ref 13.0–17.0)
MCH: 30.6 pg (ref 26.0–34.0)
MCHC: 34.2 g/dL (ref 30.0–36.0)
Platelets: 161 10*3/uL (ref 150–400)

## 2012-01-14 LAB — GLUCOSE, CAPILLARY
Glucose-Capillary: 81 mg/dL (ref 70–99)
Glucose-Capillary: 83 mg/dL (ref 70–99)
Glucose-Capillary: 79 mg/dL (ref 70–99)

## 2012-01-14 LAB — CARDIAC PANEL(CRET KIN+CKTOT+MB+TROPI)
CK, MB: 2.4 ng/mL (ref 0.3–4.0)
CK, MB: 2.8 ng/mL (ref 0.3–4.0)
Total CK: 199 U/L (ref 7–232)
Troponin I: <0.3 ng/mL (ref <0.30)
Troponin I: <0.3 ng/mL (ref <0.30)
Troponin I: <0.3 ng/mL (ref <0.30)

## 2012-01-14 LAB — COMPREHENSIVE METABOLIC PANEL
ALT: 15 U/L (ref 0–53)
AST: 22 U/L (ref 0–37)
Calcium: 8.9 mg/dL (ref 8.4–10.5)
GFR calc Af Amer: >90 mL/min (ref >90)
Sodium: 132 mEq/L — ABNORMAL LOW (ref 135–145)
Total Protein: 7 g/dL (ref 6.0–8.3)

## 2012-01-14 LAB — HEMOGLOBIN A1C: Hgb A1c MFr Bld: 6 % — ABNORMAL HIGH (ref <5.7)

## 2012-01-14 LAB — LIPID PANEL
Cholesterol: 171 mg/dL (ref 0–200)
LDL Cholesterol: 107 mg/dL — ABNORMAL HIGH (ref 0–99)
Total CHOL/HDL Ratio: 3.4 RATIO
VLDL: 13 mg/dL (ref 0–40)

## 2012-01-14 LAB — CREATININE, SERUM
Creatinine, Ser: 0.77 mg/dL (ref 0.50–1.35)
GFR calc Af Amer: >90 mL/min (ref >90)

## 2012-01-14 LAB — URINE CULTURE
Colony Count: NO GROWTH
Culture  Setup Time: 201303070204
Culture: NO GROWTH

## 2012-01-14 LAB — RAPID URINE DRUG SCREEN, HOSP PERFORMED
Barbiturates: NOT DETECTED
Benzodiazepines: NOT DETECTED
Cocaine: NOT DETECTED

## 2012-01-14 LAB — MRSA PCR SCREENING: MRSA by PCR: NEGATIVE

## 2012-01-14 NOTE — Evaluation (Signed)
Clinical/Bedside Swallow Evaluation Patient Details  Name: Jesus Ramsey MRN: [id number] DOB: 07/10/1931 Today's Date: 01/14/2012  Past Medical History:  Past Medical History  Diagnosis Date  . Mental retardation   . Hypertension   . Seizures   . Pneumonia   . Psychogenic polydipsia   . B12 deficiency anemia   . Cataract   . Altered mental status   . BPH (benign prostatic hypertrophy)    Past Surgical History: History reviewed. No pertinent past surgical history. HPI:  78 y.o. male with a history of MR.  A NH resident who was sent from the NH on 01/13/12 due to right facial droop that was noted at about 1pm.  In ED patient was evaluated.  CT and MRI of the brain showed no acute changes.  With further history it was also felt that the patient was having difficulty with gait.  Lungs are CTA.  Pt currently NPO.     Assessment/Recommendations/Treatment Plan  Clinical Impression: Pt willing to consume only limited POs; however, swallow function appears to be normal with adequate mastication, timely swallow trigger, and no indications of compromised airway protection.  Rec allowing regular consistency foods and thin liquids.  No SLP f/u warranted.   Recommendations Solid Consistency: Regular Liquid Consistency: Thin Liquid Administration via: Cup;Straw Medication Administration: Whole meds with liquid Supervision: Patient able to self feed Follow up Recommendations: None   Lekesha Claw L. Samson Frederic, [place] CCC/SLP Pager [phone]     Blenda Mounts Laurice 01/14/2012,10:08 AM

## 2012-01-14 NOTE — Consult Note (Signed)
Reason for Consult:Facial droop Referring Physician: Kevan Ny  CC: Right facial droop and difficulty with gait.  HPI: Jesus Ramsey is an 78 y.o. male with a history of MR.  A NH resident who was sent from the NH on 01/13/12 due to right facial droop that was noted at about 1pm.  In ED patient was evaluated.  CT and MRI of the brain showed no acute changes.  With further history it was also felt that the patient was having difficulty with gait.  He has been having a problem with his left foot and it seems that his gait issues started at the same time.    Past Medical History  Diagnosis Date  . Mental retardation   . Hypertension   . Seizures   . Pneumonia   . Psychogenic polydipsia   . B12 deficiency anemia   . Cataract   . Altered mental status   . BPH (benign prostatic hypertrophy)     History reviewed. No pertinent past surgical history.  History reviewed. No pertinent family history.  Social History:  reports that he has quit smoking. He does not have any smokeless tobacco history on file. He reports that he does not drink alcohol or use illicit drugs.  No Known Allergies  Medications:  I have reviewed the patient's current medications. Prior to Admission:  Prescriptions prior to admission  Medication Sig Dispense Refill  . acetaminophen (TYLENOL) 325 MG tablet Take 650 mg by mouth every 6 (six) hours as needed. pain      . docusate sodium (COLACE) 100 MG capsule Take 100 mg by mouth 2 (two) times daily as needed. For constipation      . lisinopril (PRINIVIL,ZESTRIL) 10 MG tablet Take 10 mg by mouth daily.       . magnesium hydroxide (MILK OF MAGNESIA) 400 MG/5ML suspension Take 30 mLs by mouth daily as needed. For constipation      . metoprolol (LOPRESSOR) 50 MG tablet Take 50 mg by mouth 2 (two) times daily.       Marland Kitchen neomycin-bacitracin-polymyxin (NEOSPORIN) ointment Apply 1 application topically daily. apply to left 2nd toe      . sodium chloride 1 G tablet Take 1 g by mouth  every morning.      . Tamsulosin HCl (FLOMAX) 0.4 MG CAPS Take 0.4 mg by mouth daily.         ROS: History obtained from the patient  General ROS: negative for - chills, fatigue, fever, night sweats, weight gain or weight loss Psychological ROS: negative for - behavioral disorder, hallucinations, memory difficulties, mood swings or suicidal ideation Ophthalmic ROS: negative for - blurry vision, double vision, eye pain or loss of vision ENT ROS: negative for - epistaxis, nasal discharge, oral lesions, sore throat, tinnitus or vertigo Allergy and Immunology ROS: negative for - hives or itchy/watery eyes Hematological and Lymphatic ROS: negative for - bleeding problems, bruising or swollen lymph nodes Endocrine ROS: negative for - galactorrhea, hair pattern changes, polydipsia/polyuria or temperature intolerance Respiratory ROS: negative for - cough, hemoptysis, shortness of breath or wheezing Cardiovascular ROS: negative for - chest pain, dyspnea on exertion, edema or irregular heartbeat Gastrointestinal ROS: negative for - abdominal pain, diarrhea, hematemesis, nausea/vomiting or stool incontinence Genito-Urinary ROS: negative for - dysuria, hematuria, incontinence or urinary frequency/urgency Musculoskeletal ROS: toe pain on the left foot Neurological ROS: as noted in HPI Dermatological ROS: negative for rash and skin lesion changes Physical Examination: Blood pressure 148/92, pulse 98, temperature 98.5 F (36.9 C),  temperature source Oral, resp. rate 17, SpO2 99.00%.  Musculoskeletal: Escoriated area on the top of the second to on the left foot.  Pain on touching or moving the toe.  Pulses intact in the foot.  Toe discolored.   Neurologic Examination Mental Status: Alert and oriented to name and that in the hospital.  Speech fluent without evidence of aphasia.  Able to follow 3 step commands without difficulty. Cranial Nerves: II: visual fields grossly normal, pupils equal, round,  reactive to light and accommodation III,IV, VI: ptosis not present, extra-ocular motions intact bilaterally V,VII: decrease in the right NLF, smile symmetric, facial light touch sensation normal bilaterally VIII: hearing normal bilaterally IX,X: gag reflex present XI: trapezius strength/neck flexion strength normal bilaterally XII: tongue strength normal  Motor: Right : Upper extremity   5/5    Left:     Upper extremity   5/5  Lower extremity   5/5     Lower extremity   5/5 Tone and bulk:normal tone throughout; no atrophy noted Sensory: Pinprick and light touch intact throughout, bilaterally Deep Tendon Reflexes: 2+ and symmetric with absent AJ's bilaterallythroughout Plantars: Right: mute   Left: mute Cerebellar: normal finger-to-nose and normal heel-to-shin test. With gait testing the patient complains of the floor being cold and this limits his cooperation.  Moves the right leg and seems to drag the left and not lift well off the floor.  Once returning to the bed though pulls the leg up easily to his chest to put back on his socks.  No imbalance or incoordination noted.     Results for orders placed during the hospital encounter of 01/13/12 (from the past 48 hour(s))  URINALYSIS, ROUTINE W REFLEX MICROSCOPIC     Status: Abnormal   Collection Time   01/13/12  5:05 PM      Component Value Range Comment   Color, Urine YELLOW  YELLOW     APPearance CLEAR  CLEAR     Specific Gravity, Urine 1.022  1.005 - 1.030     pH 6.0  5.0 - 8.0     Glucose, UA NEGATIVE  NEGATIVE (mg/dL)    Hgb urine dipstick SMALL (*) NEGATIVE     Bilirubin Urine NEGATIVE  NEGATIVE     Ketones, ur NEGATIVE  NEGATIVE (mg/dL)    Protein, ur NEGATIVE  NEGATIVE (mg/dL)    Urobilinogen, UA 1.0  0.0 - 1.0 (mg/dL)    Nitrite NEGATIVE  NEGATIVE     Leukocytes, UA NEGATIVE  NEGATIVE    URINE MICROSCOPIC-ADD ON     Status: Normal   Collection Time   01/13/12  5:05 PM      Component Value Range Comment   Squamous  Epithelial / LPF RARE  RARE     WBC, UA 0-2  <3 (WBC/hpf)    RBC / HPF 3-6  <3 (RBC/hpf)    Bacteria, UA RARE  RARE    CBC     Status: Normal   Collection Time   01/13/12  5:28 PM      Component Value Range Comment   WBC 7.2  4.0 - 10.5 (K/uL)    RBC 4.66  4.22 - 5.81 (MIL/uL)    Hemoglobin 14.7  13.0 - 17.0 (g/dL)    HCT 96.0  45.4 - 09.8 (%)    MCV 89.7  78.0 - 100.0 (fL)    MCH 31.5  26.0 - 34.0 (pg)    MCHC 35.2  30.0 - 36.0 (g/dL)    RDW  13.1  11.5 - 15.5 (%)    Platelets 172  150 - 400 (K/uL)   DIFFERENTIAL     Status: Abnormal   Collection Time   01/13/12  5:28 PM      Component Value Range Comment   Neutrophils Relative 71  43 - 77 (%)    Neutro Abs 5.1  1.7 - 7.7 (K/uL)    Lymphocytes Relative 14  12 - 46 (%)    Lymphs Abs 1.0  0.7 - 4.0 (K/uL)    Monocytes Relative 13 (*) 3 - 12 (%)    Monocytes Absolute 0.9  0.1 - 1.0 (K/uL)    Eosinophils Relative 2  0 - 5 (%)    Eosinophils Absolute 0.1  0.0 - 0.7 (K/uL)    Basophils Relative 0  0 - 1 (%)    Basophils Absolute 0.0  0.0 - 0.1 (K/uL)   COMPREHENSIVE METABOLIC PANEL     Status: Abnormal   Collection Time   01/13/12  5:28 PM      Component Value Range Comment   Sodium 130 (*) 135 - 145 (mEq/L)    Potassium 4.1  3.5 - 5.1 (mEq/L)    Chloride 92 (*) 96 - 112 (mEq/L)    CO2 27  19 - 32 (mEq/L)    Glucose, Bld 86  70 - 99 (mg/dL)    BUN 15  6 - 23 (mg/dL)    Creatinine, Ser 9.60  0.50 - 1.35 (mg/dL)    Calcium 9.6  8.4 - 10.5 (mg/dL)    Total Protein 7.6  6.0 - 8.3 (g/dL)    Albumin 3.6  3.5 - 5.2 (g/dL)    AST 20  0 - 37 (U/L) HEMOLYSIS AT THIS LEVEL MAY AFFECT RESULT   ALT 18  0 - 53 (U/L)    Alkaline Phosphatase 73  39 - 117 (U/L)    Total Bilirubin 0.4  0.3 - 1.2 (mg/dL)    GFR calc non Af Amer 79 (*) >90 (mL/min)    GFR calc Af Amer >90  >90 (mL/min)   TROPONIN I     Status: Normal   Collection Time   01/13/12  5:28 PM      Component Value Range Comment   Troponin I <0.30  <0.30 (ng/mL)     No results found  for this or any previous visit (from the past 240 hour(s)).  Ct Head Wo Contrast  01/13/2012  *RADIOLOGY REPORT*  Clinical Data: Right facial droop.  CT HEAD WITHOUT CONTRAST  Technique:  Contiguous axial images were obtained from the base of the skull through the vertex without contrast.  Comparison: CT head without contrast 08/12/2011.  Findings: Mild generalized atrophy is similar to the prior exam. No acute cortical infarct, hemorrhage, mass lesion is present. Mild mucosal thickening is present in the left maxillary sinus. There is wall thickening, suggesting a history of chronic sinus disease.  No fluid levels are present.  The mastoid air cells are clear.  Debris, likely cerumen, is present within the external auditory canal bilaterally.  IMPRESSION:  1.  No acute intracranial abnormality or significant interval change. 2.  Stable moderate atrophy. 3.  Wall thickening about the maxillary sinuses bilaterally, suggesting a history of chronic disease.  Original Report Authenticated By: Jamesetta Orleans. MATTERN, M.D.   Mr Brain Wo Contrast  01/13/2012  *RADIOLOGY REPORT*  Clinical Data: Right facial droop.  MRI HEAD WITHOUT CONTRAST  Technique:  Multiplanar, multiecho pulse sequences of the brain and  surrounding structures were obtained according to standard protocol without intravenous contrast.  Comparison: MRI 12/22/2009  Findings: Generalized atrophy with enlargement of the ventricles and subarachnoid space, unchanged.  Mild chronic microvascular ischemia in the white matter bilaterally.  Negative for acute infarct.  Negative for hemorrhage or mass lesion.  No midline shift.  Chronic sinusitis.  IMPRESSION: Atrophy and chronic ischemic change.  No acute infarct.  Chronic sinusitis.  Original Report Authenticated By: Camelia Phenes, M.D.     Assessment/Plan:  Patient Active Hospital Problem List: Facial droop and gait abnormality (01/13/2012)   Assessment: No evidence of acute stroke on imaging. Facial  asymmetry mild.  Patient's gait issues are out of proportion to his weakness and abilities.  Patient may very well be responding out of proportion to his injury secondary to his MR.  No significant metabolic abnormalities on initial lab work ( sodium mildly decreased) and medications should not be causing any side effects that would lead to the above complaints.     Plan:  1. PT to evaluate.    2. Further toe investigation pending   Thana Farr, MD [facility] [phone] 01/14/2012, 12:23 AM

## 2012-01-14 NOTE — Progress Notes (Signed)
VASCULAR LAB PRELIMINARY  PRELIMINARY  PRELIMINARY  PRELIMINARY  Carotid Dopplers completed.    Preliminary report:  No ICA stenosis.  Vertebral artery flow is antegrade.  Jesus Ramsey [place], 01/14/2012, 11:11 AM

## 2012-01-14 NOTE — Consult Note (Signed)
WOC consult Note Reason for Consult: eval L 2nd toe wound Wound type: no wound, pt has thick callous on the dorsal 2nd toe surface at the joint  Drainage (amount, consistency, odor) no open wound, no drainage Would recommend family to purchase OTC callous pad to protect area from rubbing his shoes.  Podiatry may be able to offer surgical or outpt removal.  Until follow up can be arranged with podiatry would not recommend any other tx.  Re consult if needed, will not follow at this time. Thanks  Gimena Buick [employer], CWOCN [phone])

## 2012-01-14 NOTE — Progress Notes (Signed)
While attempting to do neuro checks when asked to squeeze my hand to access his grip pt squeezed to point of excess to attempt to crush my hand when I asked why he did that his response was I wish you would leave me the hell alone. Pt was alert I informed him that while at hospital it ordered for me to see how you are doing he stated he is fine.Will cont to monitor. Ilean Skill LPN

## 2012-01-14 NOTE — Progress Notes (Signed)
*  PRELIMINARY RESULTS* Echocardiogram 2D Echocardiogram has been performed.  Jesus Ramsey 01/14/2012, 12:13 PM

## 2012-01-15 DIAGNOSIS — R29810 Facial weakness: Secondary | ICD-10-CM | POA: Diagnosis not present

## 2012-01-15 DIAGNOSIS — R269 Unspecified abnormalities of gait and mobility: Secondary | ICD-10-CM | POA: Diagnosis not present

## 2012-01-15 DIAGNOSIS — E538 Deficiency of other specified B group vitamins: Secondary | ICD-10-CM | POA: Diagnosis not present

## 2012-01-15 DIAGNOSIS — I119 Hypertensive heart disease without heart failure: Secondary | ICD-10-CM | POA: Diagnosis not present

## 2012-01-15 DIAGNOSIS — E871 Hypo-osmolality and hyponatremia: Secondary | ICD-10-CM | POA: Diagnosis not present

## 2012-01-15 DIAGNOSIS — I10 Essential (primary) hypertension: Secondary | ICD-10-CM | POA: Diagnosis not present

## 2012-01-15 DIAGNOSIS — G459 Transient cerebral ischemic attack, unspecified: Secondary | ICD-10-CM | POA: Diagnosis not present

## 2012-01-15 DIAGNOSIS — R631 Polydipsia: Secondary | ICD-10-CM | POA: Diagnosis not present

## 2012-01-15 DIAGNOSIS — G8190 Hemiplegia, unspecified affecting unspecified side: Secondary | ICD-10-CM | POA: Diagnosis not present

## 2012-01-15 LAB — GLUCOSE, CAPILLARY: Glucose-Capillary: 84 mg/dL (ref 70–99)

## 2012-01-15 MED ORDER — ASPIRIN 325 MG PO TABS: 325.0000 mg | ORAL_TABLET | Freq: Every day | ORAL | Status: DC

## 2012-01-15 NOTE — Discharge Instructions (Signed)
STROKE/TIA DISCHARGE INSTRUCTIONS SMOKING Cigarette smoking nearly doubles your risk of having a stroke & is the single most alterable risk factor  If you smoke or have smoked in the last 12 months, you are advised to quit smoking for your health.  Most of the excess cardiovascular risk related to smoking disappears within a year of stopping.  Ask you doctor about anti-smoking medications  [place] Quit Line: 1-800-QUIT NOW  Free Smoking Cessation Classes [phone]  CHOLESTEROL Know your levels; limit fat & cholesterol in your diet  Lipid Panel     Component Value Date/Time   CHOL 171 01/14/2012 0520   TRIG 67 01/14/2012 0520   HDL 51 01/14/2012 0520   CHOLHDL 3.4 01/14/2012 0520   VLDL 13 01/14/2012 0520   LDLCALC 107* 01/14/2012 0520      Many patients benefit from treatment even if their cholesterol is at goal.  Goal: Total Cholesterol (CHOL) less than 160  Goal:  Triglycerides (TRIG) less than 150  Goal:  HDL greater than 40  Goal:  LDL (LDLCALC) less than 100   BLOOD PRESSURE American Stroke Association blood pressure target is less that 120/80 mm/Hg  Your discharge blood pressure is:  BP: 127/84 mmHg  Monitor your blood pressure  Limit your salt and alcohol intake  Many individuals will require more than one medication for high blood pressure  DIABETES (A1c is a blood sugar average for last 3 months) Goal HGBA1c is under 7% (HBGA1c is blood sugar average for last 3 months)  Diabetes: Diagnosis of diabetes:  {STROKE DC X9J:47829}    Lab Results  Component Value Date   HGBA1C 6.0* 01/14/2012     Your HGBA1c can be lowered with medications, healthy diet, and exercise.  Check your blood sugar as directed by your physician  Call your physician if you experience unexplained or low blood sugars.  PHYSICAL ACTIVITY/REHABILITATION Goal is 30 minutes at least 4 days per week  Activity: Increase activity slowly, and Walk with assistance, Therapies: Physical Therapy: {type of  therapy:3041681} and Occupational Therapy: Nursing Facility Return to work: ***  Activity decreases your risk of heart attack and stroke and makes your heart stronger.  It helps control your weight and blood pressure; helps you relax and can improve your mood.  Participate in a regular exercise program.  Talk with your doctor about the best form of exercise for you (dancing, walking, swimming, cycling).  DIET/WEIGHT Goal is to maintain a healthy weight  Your discharge diet is: General *** liquids Your height is:  Height: 6' (182.9 cm) Your current weight is: Weight: 94.6 kg (208 lb 8.9 oz) Your Body Mass Index (BMI) is:  BMI (Calculated): 27.7   Following the type of diet specifically designed for you will help prevent another stroke.  Your goal weight range is:  ***  Your goal Body Mass Index (BMI) is 19-24.  Healthy food habits can help reduce 3 risk factors for stroke:  High cholesterol, hypertension, and excess weight.  RESOURCES Stroke/Support Group:  Call [phone]   STROKE EDUCATION PROVIDED/REVIEWED AND GIVEN TO PATIENT Stroke warning signs and symptoms How to activate emergency medical system (call 911). Medications prescribed at discharge. Need for follow-up after discharge. Personal risk factors for stroke. Pneumonia vaccine given: No Flu vaccine given: No My questions have been answered, the writing is legible, and I understand these instructions.  I will adhere to these goals & educational materials that have been provided to me after my discharge from the hospital.

## 2012-01-15 NOTE — Progress Notes (Signed)
Subjective: No complaints. No recurrence of facial weakness.  Objective: Current vital signs: BP 127/84  Pulse 101  Temp(Src) 100.2 F (37.9 C) (Oral)  Resp 18  Ht 6' (1.829 m)  Wt 94.6 kg (208 lb 8.9 oz)  BMI 28.29 kg/m2  SpO2 96%  Neurologic Exam: Alert and in no distress. No facial asymmetry noted. Strength was normal throughout. Muscle tone was moderately increased in both upper extremities as well as the lower extremities. There was mild cogwheel rigidity at the right wrist. No tremor was noted. Patient had moderate difficulty standing from a seated position. Posture was stooped. He required support for standing and walking. Steps were very short and face walking was very slow.  Lipid Panel  Basename 01/14/12 0520  CHOL 171  TRIG 67  HDL 51  CHOLHDL 3.4  VLDL 13  LDLCALC 161*    Studies/Results: Ct Head Wo Contrast  01/13/2012  *RADIOLOGY REPORT*  Clinical Data: Right facial droop.  CT HEAD WITHOUT CONTRAST  Technique:  Contiguous axial images were obtained from the base of the skull through the vertex without contrast.  Comparison: CT head without contrast 08/12/2011.  Findings: Mild generalized atrophy is similar to the prior exam. No acute cortical infarct, hemorrhage, mass lesion is present. Mild mucosal thickening is present in the left maxillary sinus. There is wall thickening, suggesting a history of chronic sinus disease.  No fluid levels are present.  The mastoid air cells are clear.  Debris, likely cerumen, is present within the external auditory canal bilaterally.  IMPRESSION:  1.  No acute intracranial abnormality or significant interval change. 2.  Stable moderate atrophy. 3.  Wall thickening about the maxillary sinuses bilaterally, suggesting a history of chronic disease.  Original Report Authenticated By: Jamesetta Orleans. MATTERN, M.D.   Mr Brain Wo Contrast  01/13/2012  *RADIOLOGY REPORT*  Clinical Data: Right facial droop.  MRI HEAD WITHOUT CONTRAST  Technique:   Multiplanar, multiecho pulse sequences of the brain and surrounding structures were obtained according to standard protocol without intravenous contrast.  Comparison: MRI 12/22/2009  Findings: Generalized atrophy with enlargement of the ventricles and subarachnoid space, unchanged.  Mild chronic microvascular ischemia in the white matter bilaterally.  Negative for acute infarct.  Negative for hemorrhage or mass lesion.  No midline shift.  Chronic sinusitis.  IMPRESSION: Atrophy and chronic ischemic change.  No acute infarct.  Chronic sinusitis.  Original Report Authenticated By: Camelia Phenes, M.D.   Dg Foot 2 Views Left  01/14/2012  *RADIOLOGY REPORT*  Clinical Data: Pain and swelling in the left second toe.  LEFT FOOT - 2 VIEW  Comparison: No priors.  Findings: Two views of the foot demonstrate no acute fracture or dislocation.  Degenerative changes are seen throughout the foot, particularly in the forefoot.  No gross areas of osteolysis (specifically, none in the second toe).  No definite focal soft tissue abnormality.  IMPRESSION: 1.  No acute radiographic abnormality of the left second toe to account the patient's symptoms.  Original Report Authenticated By: Florencia Reasons, M.D.   Mr Mra Head/brain Wo Cm  01/14/2012  *RADIOLOGY REPORT*  Clinical Data: Facial droop.  MRA HEAD WITHOUT CONTRAST  Technique: Angiographic images of the Circle of Willis were obtained using MRA technique without intravenous contrast.  Comparison: CT and MRI brain 01/13/2012.  Findings: Anterior circulation without medium or large size vessel significant stenosis or occlusion.  Mild middle cerebral artery branch vessel irregularity.  No significant stenosis of the distal vertebral arteries.  Mild to moderate focal stenosis proximal left PICA.  Slightly small right PICA.  Minimal narrowing proximal basilar artery.  Nonvisualization left PICA.  Minimal irregularity posterior cerebral artery distal branches.  No aneurysm or  vascular malformation noted.  IMPRESSION: Mild branch vessel atherosclerotic type changes.  Original Report Authenticated By: Fuller Canada, M.D.    Medications:  Scheduled:   . aspirin  325 mg Oral Daily  . enoxaparin  40 mg Subcutaneous Q24H  . lisinopril  10 mg Oral Daily  . metoprolol  50 mg Oral BID  . Tamsulosin HCl  0.4 mg Oral Daily    Assessment/Plan:  1. Unremarkable TIA workup, including MRI and MRA of the brain as well as carotid Doppler and echocardiogram. 2. Possible Parkinson's disease is significant contributing factor to patient's gait abnormality. His gait pattern as described above he has apparently chronic and typical, per Dr. Kevan Ny, his primary doctor. 3. Mental retardation, stable.  Plan: 1. Continue aspirin 325 mg per day for antiplatelet therapy 2. No further neurodiagnostic studies are indicated. 3. Outpatient neurology evaluation for consideration of treatment with antiparkinsonian medication.  C.R. Roseanne Reno, MD [facility]  01/15/2012  10:32 AM

## 2012-01-15 NOTE — Discharge Summary (Addendum)
Physician Discharge Summary  NAME:Jesus Ramsey  ZOX:[id number]  DOB: 07/10/1931   Admit date: 01/13/2012 Discharge date: 01/15/2012  Discharge Diagnoses:  Active Problems:  Facial droop - apparently had facial droop on the right that resolved on admission to the hospital. Possibly had a TIA  Gait difficulty - chronic with some contribution from painful left second toe secondary to severe corn/callus  HTN (hypertension) - controlled  Mental retardation - lifelong  Hyponatremia - usually mild to moderate. Takes sodium chloride tablet 1 g daily. Secondary to psychogenic polydipsia  Psychogenic polydipsia - fairly well controlled. Has had seizures in the past from too low a sodium but none recently  B12 deficiency - continue B12 replacement therapy   Discharge Condition: Stable  Hospital Course: 77 year old male well-known to me and lives at [facility]. It is my understanding that he has assisted living and not skilled nursing status but that may have changed. He recently had a brother pass away but he has another brother and sister-in-law in town who are very attentive to him. His brother, Jesus Ramsey, has Parkinson's disease. His sister Jesus, Jesus Ramsey, is probably his closest relative and she is quite supportive. Apparently on the day of admission, Jesus Ramsey suffered a right facial droop at about 1 PM at the assisted living facility and he was transferred to the [facility] emergency room where CT and MRI of the brain showed no acute changes. He has a chronic problem with his gait secondary to painful feet which I have treated for years. Primarily secondary to corns and calluses that sometimes get infected. His hospital course here has been uneventful. No neurologic focal deficits noted. 2-D echo results are pending the carotid Dopplers revealed no significant stenoses. We'll plan to discharge back to the facility on aspirin 325 mg daily and followup of 2-D echo.   Filed Vitals:   01/14/12 2000 01/15/12 0000 01/15/12 0400 01/15/12 0500  BP: 127/79 132/86 109/71   Pulse: 90 80 90   Temp: 98.4 F (36.9 C) 98.6 F (37 C) 98.3 F (36.8 C)   TempSrc: Oral Oral Oral   Resp: 19 18 19    Height:      Weight:    94.6 kg (208 lb 8.9 oz)  SpO2: 95% 96% 95%     Physical Examination:  Mental Status:  Alert and oriented to name and that in the hospital. Speech fluent without evidence of aphasia. Able to follow 3 step commands without difficulty.  Cranial Nerves: 2 through 12 grossly intact Motor:  Right : Upper extremity 5/5 Left: Upper extremity 5/5  Lower extremity 5/5 Lower extremity 5/5  Tone and bulk:normal tone throughout; no atrophy noted  Sensory: light touch intact throughout, bilaterally  Deep Tendon Reflexes: Symmetric Plantars:  Right: mute Left: mute  Cerebellar:  normal finger-to-nose and normal heel-to-shin test.  Consults: Neurology - Dr. Thana Farr, consultation on 01/14/2012  Disposition: Transfer back to [facility] today in follow up in the office in one week  Discharge Orders    Future Orders Please Complete By Expires   Diet - low sodium heart healthy      Increase activity slowly      Discharge instructions      Comments:   Call M.D. for recurrent evidence of left facial droop or any other focal neurologic abnormalities such as weakness numbness or tingling on one side or the other or speech or visual difficulties   Call MD for:  temperature >100.4  Call MD for:  difficulty breathing, headache or visual disturbances      Call MD for:  persistant dizziness or light-headedness        Medication List  As of 01/15/2012  7:46 AM   TAKE these medications         acetaminophen 325 MG tablet   Commonly known as: TYLENOL   Take 650 mg by mouth every 6 (six) hours as needed. pain      aspirin 325 MG tablet   Take 1 tablet (325 mg total) by mouth daily.      docusate sodium 100 MG capsule   Commonly known as: COLACE   Take 100  mg by mouth 2 (two) times daily as needed. For constipation      lisinopril 10 MG tablet   Commonly known as: PRINIVIL,ZESTRIL   Take 10 mg by mouth daily.      magnesium hydroxide 400 MG/5ML suspension   Commonly known as: MILK OF MAGNESIA   Take 30 mLs by mouth daily as needed. For constipation      metoprolol 50 MG tablet   Commonly known as: LOPRESSOR   Take 50 mg by mouth 2 (two) times daily.      neomycin-bacitracin-polymyxin ointment   Commonly known as: NEOSPORIN   Apply 1 application topically daily. apply to left 2nd toe      sodium chloride 1 G tablet   Take 1 g by mouth every morning.      Tamsulosin HCl 0.4 MG Caps   Commonly known as: FLOMAX   Take 0.4 mg by mouth daily.           Follow-up Information    Follow up with Jesus Xu NEVILL, MD .         Things to follow up in the outpatient setting: Monitor for recurrent facial droop or focal neurologic changes such as weakness or numbness or worsening gait Time coordinating discharge: 35 minutes  The results of significant diagnostics from this hospitalization (including imaging, microbiology, ancillary and laboratory) are listed below for reference.    Significant Diagnostic Studies: Ct Head Wo Contrast  01/13/2012  *RADIOLOGY REPORT*  Clinical Data: Right facial droop.  CT HEAD WITHOUT CONTRAST  Technique:  Contiguous axial images were obtained from the base of the skull through the vertex without contrast.  Comparison: CT head without contrast 08/12/2011.  Findings: Mild generalized atrophy is similar to the prior exam. No acute cortical infarct, hemorrhage, mass lesion is present. Mild mucosal thickening is present in the left maxillary sinus. There is wall thickening, suggesting a history of chronic sinus disease.  No fluid levels are present.  The mastoid air cells are clear.  Debris, likely cerumen, is present within the external auditory canal bilaterally.  IMPRESSION:  1.  No acute intracranial  abnormality or significant interval change. 2.  Stable moderate atrophy. 3.  Wall thickening about the maxillary sinuses bilaterally, suggesting a history of chronic disease.  Original Report Authenticated By: Jamesetta Orleans. MATTERN, M.D.   Mr Brain Wo Contrast  01/13/2012  *RADIOLOGY REPORT*  Clinical Data: Right facial droop.  MRI HEAD WITHOUT CONTRAST  Technique:  Multiplanar, multiecho pulse sequences of the brain and surrounding structures were obtained according to standard protocol without intravenous contrast.  Comparison: MRI 12/22/2009  Findings: Generalized atrophy with enlargement of the ventricles and subarachnoid space, unchanged.  Mild chronic microvascular ischemia in the white matter bilaterally.  Negative for acute infarct.  Negative for hemorrhage or mass lesion.  No midline shift.  Chronic sinusitis.  IMPRESSION: Atrophy and chronic ischemic change.  No acute infarct.  Chronic sinusitis.  Original Report Authenticated By: Camelia Phenes, M.D.   Dg Foot 2 Views Left  01/14/2012  *RADIOLOGY REPORT*  Clinical Data: Pain and swelling in the left second toe.  LEFT FOOT - 2 VIEW  Comparison: No priors.  Findings: Two views of the foot demonstrate no acute fracture or dislocation.  Degenerative changes are seen throughout the foot, particularly in the forefoot.  No gross areas of osteolysis (specifically, none in the second toe).  No definite focal soft tissue abnormality.  IMPRESSION: 1.  No acute radiographic abnormality of the left second toe to account the patient's symptoms.  Original Report Authenticated By: Florencia Reasons, M.D.   Mr Mra Head/brain Wo Cm  01/14/2012  *RADIOLOGY REPORT*  Clinical Data: Facial droop.  MRA HEAD WITHOUT CONTRAST  Technique: Angiographic images of the Circle of Willis were obtained using MRA technique without intravenous contrast.  Comparison: CT and MRI brain 01/13/2012.  Findings: Anterior circulation without medium or large size vessel significant stenosis  or occlusion.  Mild middle cerebral artery branch vessel irregularity.  No significant stenosis of the distal vertebral arteries.  Mild to moderate focal stenosis proximal left PICA.  Slightly small right PICA.  Minimal narrowing proximal basilar artery.  Nonvisualization left PICA.  Minimal irregularity posterior cerebral artery distal branches.  No aneurysm or vascular malformation noted.  IMPRESSION: Mild branch vessel atherosclerotic type changes.  Original Report Authenticated By: Fuller Canada, M.D.    Microbiology: Recent Results (from the past 240 hour(s))  URINE CULTURE     Status: Normal   Collection Time   01/13/12  5:05 PM      Component Value Range Status Comment   Specimen Description URINE, CLEAN CATCH   Final    Special Requests NONE   Final    Culture  Setup Time [id number]   Final    Colony Count NO GROWTH   Final    Culture NO GROWTH   Final    Report Status 01/14/2012 FINAL   Final   MRSA PCR SCREENING     Status: Normal   Collection Time   01/14/12  4:47 AM      Component Value Range Status Comment   MRSA by PCR NEGATIVE  NEGATIVE  Final      Labs: Results for orders placed during the hospital encounter of 01/13/12  CBC      Component Value Range   WBC 7.2  4.0 - 10.5 (K/uL)   RBC 4.66  4.22 - 5.81 (MIL/uL)   Hemoglobin 14.7  13.0 - 17.0 (g/dL)   HCT 53.6  64.4 - 03.4 (%)   MCV 89.7  78.0 - 100.0 (fL)   MCH 31.5  26.0 - 34.0 (pg)   MCHC 35.2  30.0 - 36.0 (g/dL)   RDW 74.2  59.5 - 63.8 (%)   Platelets 172  150 - 400 (K/uL)  DIFFERENTIAL      Component Value Range   Neutrophils Relative 71  43 - 77 (%)   Neutro Abs 5.1  1.7 - 7.7 (K/uL)   Lymphocytes Relative 14  12 - 46 (%)   Lymphs Abs 1.0  0.7 - 4.0 (K/uL)   Monocytes Relative 13 (*) 3 - 12 (%)   Monocytes Absolute 0.9  0.1 - 1.0 (K/uL)   Eosinophils Relative 2  0 - 5 (%)   Eosinophils Absolute 0.1  0.0 - 0.7 (K/uL)   Basophils Relative 0  0 - 1 (%)   Basophils Absolute 0.0  0.0 - 0.1 (K/uL)  URINE  CULTURE      Component Value Range   Specimen Description URINE, CLEAN CATCH     Special Requests NONE     Culture  Setup Time [id number]     Colony Count NO GROWTH     Culture NO GROWTH     Report Status 01/14/2012 FINAL    URINALYSIS, ROUTINE W REFLEX MICROSCOPIC      Component Value Range   Color, Urine YELLOW  YELLOW    APPearance CLEAR  CLEAR    Specific Gravity, Urine 1.022  1.005 - 1.030    pH 6.0  5.0 - 8.0    Glucose, UA NEGATIVE  NEGATIVE (mg/dL)   Hgb urine dipstick SMALL (*) NEGATIVE    Bilirubin Urine NEGATIVE  NEGATIVE    Ketones, ur NEGATIVE  NEGATIVE (mg/dL)   Protein, ur NEGATIVE  NEGATIVE (mg/dL)   Urobilinogen, UA 1.0  0.0 - 1.0 (mg/dL)   Nitrite NEGATIVE  NEGATIVE    Leukocytes, UA NEGATIVE  NEGATIVE   COMPREHENSIVE METABOLIC PANEL      Component Value Range   Sodium 130 (*) 135 - 145 (mEq/L)   Potassium 4.1  3.5 - 5.1 (mEq/L)   Chloride 92 (*) 96 - 112 (mEq/L)   CO2 27  19 - 32 (mEq/L)   Glucose, Bld 86  70 - 99 (mg/dL)   BUN 15  6 - 23 (mg/dL)   Creatinine, Ser 2.13  0.50 - 1.35 (mg/dL)   Calcium 9.6  8.4 - 08.6 (mg/dL)   Total Protein 7.6  6.0 - 8.3 (g/dL)   Albumin 3.6  3.5 - 5.2 (g/dL)   AST 20  0 - 37 (U/L)   ALT 18  0 - 53 (U/L)   Alkaline Phosphatase 73  39 - 117 (U/L)   Total Bilirubin 0.4  0.3 - 1.2 (mg/dL)   GFR calc non Af Amer 79 (*) >90 (mL/min)   GFR calc Af Amer >90  >90 (mL/min)  TROPONIN I      Component Value Range   Troponin I <0.30  <0.30 (ng/mL)  URINE MICROSCOPIC-ADD ON      Component Value Range   Squamous Epithelial / LPF RARE  RARE    WBC, UA 0-2  <3 (WBC/hpf)   RBC / HPF 3-6  <3 (RBC/hpf)   Bacteria, UA RARE  RARE   COMPREHENSIVE METABOLIC PANEL      Component Value Range   Sodium 132 (*) 135 - 145 (mEq/L)   Potassium 3.7  3.5 - 5.1 (mEq/L)   Chloride 99  96 - 112 (mEq/L)   CO2 25  19 - 32 (mEq/L)   Glucose, Bld 83  70 - 99 (mg/dL)   BUN 11  6 - 23 (mg/dL)   Creatinine, Ser 5.78  0.50 - 1.35 (mg/dL)   Calcium  8.9  8.4 - 10.5 (mg/dL)   Total Protein 7.0  6.0 - 8.3 (g/dL)   Albumin 3.1 (*) 3.5 - 5.2 (g/dL)   AST 22  0 - 37 (U/L)   ALT 15  0 - 53 (U/L)   Alkaline Phosphatase 67  39 - 117 (U/L)   Total Bilirubin 0.6  0.3 - 1.2 (mg/dL)   GFR calc non Af Amer 87 (*) >90 (mL/min)   GFR calc Af Amer >90  >90 (mL/min)  CBC  Component Value Range   WBC 5.4  4.0 - 10.5 (K/uL)   RBC 4.44  4.22 - 5.81 (MIL/uL)   Hemoglobin 13.6  13.0 - 17.0 (g/dL)   HCT 16.1  09.6 - 04.5 (%)   MCV 89.6  78.0 - 100.0 (fL)   MCH 30.6  26.0 - 34.0 (pg)   MCHC 34.2  30.0 - 36.0 (g/dL)   RDW 40.9  81.1 - 91.4 (%)   Platelets 173  150 - 400 (K/uL)  TSH      Component Value Range   TSH 1.150  0.350 - 4.500 (uIU/mL)  HEMOGLOBIN A1C      Component Value Range   Hemoglobin A1C 6.0 (*) <5.7 (%)   Mean Plasma Glucose 126 (*) <117 (mg/dL)  URINE RAPID DRUG SCREEN (HOSP PERFORMED)      Component Value Range   Opiates NONE DETECTED  NONE DETECTED    Cocaine NONE DETECTED  NONE DETECTED    Benzodiazepines NONE DETECTED  NONE DETECTED    Amphetamines NONE DETECTED  NONE DETECTED    Tetrahydrocannabinol NONE DETECTED  NONE DETECTED    Barbiturates NONE DETECTED  NONE DETECTED   CBC      Component Value Range   WBC 6.2  4.0 - 10.5 (K/uL)   RBC 4.50  4.22 - 5.81 (MIL/uL)   Hemoglobin 13.9  13.0 - 17.0 (g/dL)   HCT 78.2  95.6 - 21.3 (%)   MCV 89.6  78.0 - 100.0 (fL)   MCH 30.9  26.0 - 34.0 (pg)   MCHC 34.5  30.0 - 36.0 (g/dL)   RDW 08.6  57.8 - 46.9 (%)   Platelets 161  150 - 400 (K/uL)  CREATININE, SERUM      Component Value Range   Creatinine, Ser 0.77  0.50 - 1.35 (mg/dL)   GFR calc non Af Amer 85 (*) >90 (mL/min)   GFR calc Af Amer >90  >90 (mL/min)  CARDIAC PANEL(CRET KIN+CKTOT+MB+TROPI)      Component Value Range   Total CK 209  7 - 232 (U/L)   CK, MB 2.4  0.3 - 4.0 (ng/mL)   Troponin I <0.30  <0.30 (ng/mL)   Relative Index 1.1  0.0 - 2.5   CARDIAC PANEL(CRET KIN+CKTOT+MB+TROPI)      Component Value  Range   Total CK 186  7 - 232 (U/L)   CK, MB 2.5  0.3 - 4.0 (ng/mL)   Troponin I <0.30  <0.30 (ng/mL)   Relative Index 1.3  0.0 - 2.5   CARDIAC PANEL(CRET KIN+CKTOT+MB+TROPI)      Component Value Range   Total CK 199  7 - 232 (U/L)   CK, MB 2.8  0.3 - 4.0 (ng/mL)   Troponin I <0.30  <0.30 (ng/mL)   Relative Index 1.4  0.0 - 2.5   GLUCOSE, CAPILLARY      Component Value Range   Glucose-Capillary 84  70 - 99 (mg/dL)  LIPID PANEL      Component Value Range   Cholesterol 171  0 - 200 (mg/dL)   Triglycerides 67  <629 (mg/dL)   HDL 51  >52 (mg/dL)   Total CHOL/HDL Ratio 3.4     VLDL 13  0 - 40 (mg/dL)   LDL Cholesterol 841 (*) 0 - 99 (mg/dL)  GLUCOSE, CAPILLARY      Component Value Range   Glucose-Capillary 81  70 - 99 (mg/dL)  MRSA PCR SCREENING      Component Value Range   MRSA  by PCR NEGATIVE  NEGATIVE   GLUCOSE, CAPILLARY      Component Value Range   Glucose-Capillary 83  70 - 99 (mg/dL)   Comment 1 Notify RN    GLUCOSE, CAPILLARY      Component Value Range   Glucose-Capillary 133 (*) 70 - 99 (mg/dL)  GLUCOSE, CAPILLARY      Component Value Range   Glucose-Capillary 79  70 - 99 (mg/dL)  GLUCOSE, CAPILLARY      Component Value Range   Glucose-Capillary 89  70 - 99 (mg/dL)  GLUCOSE, CAPILLARY      Component Value Range   Glucose-Capillary 91  70 - 99 (mg/dL)     Signed: Unknown Schleyer Ramsey 01/15/2012, 7:46 AM

## 2012-01-15 NOTE — Progress Notes (Signed)
D/c orders received; IV removed with gauze on, pt remains in stable condition; pt with cognitive limitations, d/c information, meds,and instruction copies were given to facility; pt transferred back to [facility] via ambulance; report called to nurse at [facility] receiving pt

## 2012-01-15 NOTE — Progress Notes (Signed)
Jesus Ramsey is from [facility] and is ready for discharge today per MD. Facility contacted Maralyn Sago) and discharge information faxed and approved. Clinical Social Worker facilitated transport to facility via ambulance.  Genelle Bal, MSW, LCSW [phone]

## 2012-01-18 DIAGNOSIS — H40029 Open angle with borderline findings, high risk, unspecified eye: Secondary | ICD-10-CM | POA: Diagnosis not present

## 2012-01-18 DIAGNOSIS — H2510 Age-related nuclear cataract, unspecified eye: Secondary | ICD-10-CM | POA: Diagnosis not present

## 2012-02-09 DIAGNOSIS — R279 Unspecified lack of coordination: Secondary | ICD-10-CM | POA: Diagnosis not present

## 2012-02-09 DIAGNOSIS — M25676 Stiffness of unspecified foot, not elsewhere classified: Secondary | ICD-10-CM | POA: Diagnosis not present

## 2012-02-09 DIAGNOSIS — R262 Difficulty in walking, not elsewhere classified: Secondary | ICD-10-CM | POA: Diagnosis not present

## 2012-02-09 DIAGNOSIS — G459 Transient cerebral ischemic attack, unspecified: Secondary | ICD-10-CM | POA: Diagnosis not present

## 2012-02-09 DIAGNOSIS — M25673 Stiffness of unspecified ankle, not elsewhere classified: Secondary | ICD-10-CM | POA: Diagnosis not present

## 2012-02-09 DIAGNOSIS — I10 Essential (primary) hypertension: Secondary | ICD-10-CM | POA: Diagnosis not present

## 2012-02-10 DIAGNOSIS — R262 Difficulty in walking, not elsewhere classified: Secondary | ICD-10-CM | POA: Diagnosis not present

## 2012-02-10 DIAGNOSIS — I10 Essential (primary) hypertension: Secondary | ICD-10-CM | POA: Diagnosis not present

## 2012-02-10 DIAGNOSIS — G459 Transient cerebral ischemic attack, unspecified: Secondary | ICD-10-CM | POA: Diagnosis not present

## 2012-02-10 DIAGNOSIS — R279 Unspecified lack of coordination: Secondary | ICD-10-CM | POA: Diagnosis not present

## 2012-02-10 DIAGNOSIS — M25673 Stiffness of unspecified ankle, not elsewhere classified: Secondary | ICD-10-CM | POA: Diagnosis not present

## 2012-02-11 DIAGNOSIS — I10 Essential (primary) hypertension: Secondary | ICD-10-CM | POA: Diagnosis not present

## 2012-02-11 DIAGNOSIS — R279 Unspecified lack of coordination: Secondary | ICD-10-CM | POA: Diagnosis not present

## 2012-02-11 DIAGNOSIS — G459 Transient cerebral ischemic attack, unspecified: Secondary | ICD-10-CM | POA: Diagnosis not present

## 2012-02-11 DIAGNOSIS — R262 Difficulty in walking, not elsewhere classified: Secondary | ICD-10-CM | POA: Diagnosis not present

## 2012-02-11 DIAGNOSIS — M25673 Stiffness of unspecified ankle, not elsewhere classified: Secondary | ICD-10-CM | POA: Diagnosis not present

## 2012-02-15 DIAGNOSIS — R262 Difficulty in walking, not elsewhere classified: Secondary | ICD-10-CM | POA: Diagnosis not present

## 2012-02-15 DIAGNOSIS — M25673 Stiffness of unspecified ankle, not elsewhere classified: Secondary | ICD-10-CM | POA: Diagnosis not present

## 2012-02-15 DIAGNOSIS — I10 Essential (primary) hypertension: Secondary | ICD-10-CM | POA: Diagnosis not present

## 2012-02-15 DIAGNOSIS — G459 Transient cerebral ischemic attack, unspecified: Secondary | ICD-10-CM | POA: Diagnosis not present

## 2012-02-15 DIAGNOSIS — R279 Unspecified lack of coordination: Secondary | ICD-10-CM | POA: Diagnosis not present

## 2012-02-16 DIAGNOSIS — M25673 Stiffness of unspecified ankle, not elsewhere classified: Secondary | ICD-10-CM | POA: Diagnosis not present

## 2012-02-16 DIAGNOSIS — R262 Difficulty in walking, not elsewhere classified: Secondary | ICD-10-CM | POA: Diagnosis not present

## 2012-02-16 DIAGNOSIS — R279 Unspecified lack of coordination: Secondary | ICD-10-CM | POA: Diagnosis not present

## 2012-02-16 DIAGNOSIS — I10 Essential (primary) hypertension: Secondary | ICD-10-CM | POA: Diagnosis not present

## 2012-02-16 DIAGNOSIS — G459 Transient cerebral ischemic attack, unspecified: Secondary | ICD-10-CM | POA: Diagnosis not present

## 2012-02-17 DIAGNOSIS — G459 Transient cerebral ischemic attack, unspecified: Secondary | ICD-10-CM | POA: Diagnosis not present

## 2012-02-17 DIAGNOSIS — I10 Essential (primary) hypertension: Secondary | ICD-10-CM | POA: Diagnosis not present

## 2012-02-17 DIAGNOSIS — R262 Difficulty in walking, not elsewhere classified: Secondary | ICD-10-CM | POA: Diagnosis not present

## 2012-02-17 DIAGNOSIS — R279 Unspecified lack of coordination: Secondary | ICD-10-CM | POA: Diagnosis not present

## 2012-02-17 DIAGNOSIS — M25673 Stiffness of unspecified ankle, not elsewhere classified: Secondary | ICD-10-CM | POA: Diagnosis not present

## 2012-02-18 DIAGNOSIS — I10 Essential (primary) hypertension: Secondary | ICD-10-CM | POA: Diagnosis not present

## 2012-02-18 DIAGNOSIS — G459 Transient cerebral ischemic attack, unspecified: Secondary | ICD-10-CM | POA: Diagnosis not present

## 2012-02-18 DIAGNOSIS — R262 Difficulty in walking, not elsewhere classified: Secondary | ICD-10-CM | POA: Diagnosis not present

## 2012-02-18 DIAGNOSIS — M25673 Stiffness of unspecified ankle, not elsewhere classified: Secondary | ICD-10-CM | POA: Diagnosis not present

## 2012-02-18 DIAGNOSIS — R279 Unspecified lack of coordination: Secondary | ICD-10-CM | POA: Diagnosis not present

## 2012-02-18 DIAGNOSIS — M25676 Stiffness of unspecified foot, not elsewhere classified: Secondary | ICD-10-CM | POA: Diagnosis not present

## 2012-02-19 DIAGNOSIS — R262 Difficulty in walking, not elsewhere classified: Secondary | ICD-10-CM | POA: Diagnosis not present

## 2012-02-19 DIAGNOSIS — I10 Essential (primary) hypertension: Secondary | ICD-10-CM | POA: Diagnosis not present

## 2012-02-19 DIAGNOSIS — R279 Unspecified lack of coordination: Secondary | ICD-10-CM | POA: Diagnosis not present

## 2012-02-19 DIAGNOSIS — M25673 Stiffness of unspecified ankle, not elsewhere classified: Secondary | ICD-10-CM | POA: Diagnosis not present

## 2012-02-19 DIAGNOSIS — G459 Transient cerebral ischemic attack, unspecified: Secondary | ICD-10-CM | POA: Diagnosis not present

## 2012-02-23 DIAGNOSIS — M25673 Stiffness of unspecified ankle, not elsewhere classified: Secondary | ICD-10-CM | POA: Diagnosis not present

## 2012-02-23 DIAGNOSIS — R279 Unspecified lack of coordination: Secondary | ICD-10-CM | POA: Diagnosis not present

## 2012-02-23 DIAGNOSIS — I10 Essential (primary) hypertension: Secondary | ICD-10-CM | POA: Diagnosis not present

## 2012-02-23 DIAGNOSIS — M25676 Stiffness of unspecified foot, not elsewhere classified: Secondary | ICD-10-CM | POA: Diagnosis not present

## 2012-02-23 DIAGNOSIS — R262 Difficulty in walking, not elsewhere classified: Secondary | ICD-10-CM | POA: Diagnosis not present

## 2012-02-23 DIAGNOSIS — G459 Transient cerebral ischemic attack, unspecified: Secondary | ICD-10-CM | POA: Diagnosis not present

## 2012-02-24 DIAGNOSIS — I10 Essential (primary) hypertension: Secondary | ICD-10-CM | POA: Diagnosis not present

## 2012-02-24 DIAGNOSIS — R262 Difficulty in walking, not elsewhere classified: Secondary | ICD-10-CM | POA: Diagnosis not present

## 2012-02-24 DIAGNOSIS — R279 Unspecified lack of coordination: Secondary | ICD-10-CM | POA: Diagnosis not present

## 2012-02-24 DIAGNOSIS — M25673 Stiffness of unspecified ankle, not elsewhere classified: Secondary | ICD-10-CM | POA: Diagnosis not present

## 2012-02-24 DIAGNOSIS — G459 Transient cerebral ischemic attack, unspecified: Secondary | ICD-10-CM | POA: Diagnosis not present

## 2012-02-25 DIAGNOSIS — M25676 Stiffness of unspecified foot, not elsewhere classified: Secondary | ICD-10-CM | POA: Diagnosis not present

## 2012-02-25 DIAGNOSIS — I10 Essential (primary) hypertension: Secondary | ICD-10-CM | POA: Diagnosis not present

## 2012-02-25 DIAGNOSIS — G459 Transient cerebral ischemic attack, unspecified: Secondary | ICD-10-CM | POA: Diagnosis not present

## 2012-02-25 DIAGNOSIS — R279 Unspecified lack of coordination: Secondary | ICD-10-CM | POA: Diagnosis not present

## 2012-02-25 DIAGNOSIS — R262 Difficulty in walking, not elsewhere classified: Secondary | ICD-10-CM | POA: Diagnosis not present

## 2012-02-29 DIAGNOSIS — I10 Essential (primary) hypertension: Secondary | ICD-10-CM | POA: Diagnosis not present

## 2012-02-29 DIAGNOSIS — G459 Transient cerebral ischemic attack, unspecified: Secondary | ICD-10-CM | POA: Diagnosis not present

## 2012-02-29 DIAGNOSIS — R262 Difficulty in walking, not elsewhere classified: Secondary | ICD-10-CM | POA: Diagnosis not present

## 2012-02-29 DIAGNOSIS — M25673 Stiffness of unspecified ankle, not elsewhere classified: Secondary | ICD-10-CM | POA: Diagnosis not present

## 2012-02-29 DIAGNOSIS — R279 Unspecified lack of coordination: Secondary | ICD-10-CM | POA: Diagnosis not present

## 2012-03-01 DIAGNOSIS — G459 Transient cerebral ischemic attack, unspecified: Secondary | ICD-10-CM | POA: Diagnosis not present

## 2012-03-01 DIAGNOSIS — R262 Difficulty in walking, not elsewhere classified: Secondary | ICD-10-CM | POA: Diagnosis not present

## 2012-03-01 DIAGNOSIS — R279 Unspecified lack of coordination: Secondary | ICD-10-CM | POA: Diagnosis not present

## 2012-03-01 DIAGNOSIS — M25673 Stiffness of unspecified ankle, not elsewhere classified: Secondary | ICD-10-CM | POA: Diagnosis not present

## 2012-03-01 DIAGNOSIS — I10 Essential (primary) hypertension: Secondary | ICD-10-CM | POA: Diagnosis not present

## 2012-03-02 DIAGNOSIS — R262 Difficulty in walking, not elsewhere classified: Secondary | ICD-10-CM | POA: Diagnosis not present

## 2012-03-02 DIAGNOSIS — G459 Transient cerebral ischemic attack, unspecified: Secondary | ICD-10-CM | POA: Diagnosis not present

## 2012-03-02 DIAGNOSIS — I10 Essential (primary) hypertension: Secondary | ICD-10-CM | POA: Diagnosis not present

## 2012-03-02 DIAGNOSIS — R279 Unspecified lack of coordination: Secondary | ICD-10-CM | POA: Diagnosis not present

## 2012-03-02 DIAGNOSIS — M25676 Stiffness of unspecified foot, not elsewhere classified: Secondary | ICD-10-CM | POA: Diagnosis not present

## 2012-03-03 DIAGNOSIS — M25673 Stiffness of unspecified ankle, not elsewhere classified: Secondary | ICD-10-CM | POA: Diagnosis not present

## 2012-03-03 DIAGNOSIS — I10 Essential (primary) hypertension: Secondary | ICD-10-CM | POA: Diagnosis not present

## 2012-03-03 DIAGNOSIS — R262 Difficulty in walking, not elsewhere classified: Secondary | ICD-10-CM | POA: Diagnosis not present

## 2012-03-03 DIAGNOSIS — R279 Unspecified lack of coordination: Secondary | ICD-10-CM | POA: Diagnosis not present

## 2012-03-03 DIAGNOSIS — G459 Transient cerebral ischemic attack, unspecified: Secondary | ICD-10-CM | POA: Diagnosis not present

## 2012-03-07 DIAGNOSIS — I10 Essential (primary) hypertension: Secondary | ICD-10-CM | POA: Diagnosis not present

## 2012-03-07 DIAGNOSIS — M25676 Stiffness of unspecified foot, not elsewhere classified: Secondary | ICD-10-CM | POA: Diagnosis not present

## 2012-03-07 DIAGNOSIS — R279 Unspecified lack of coordination: Secondary | ICD-10-CM | POA: Diagnosis not present

## 2012-03-07 DIAGNOSIS — R262 Difficulty in walking, not elsewhere classified: Secondary | ICD-10-CM | POA: Diagnosis not present

## 2012-03-07 DIAGNOSIS — G459 Transient cerebral ischemic attack, unspecified: Secondary | ICD-10-CM | POA: Diagnosis not present

## 2012-03-08 DIAGNOSIS — R269 Unspecified abnormalities of gait and mobility: Secondary | ICD-10-CM | POA: Diagnosis not present

## 2012-03-08 DIAGNOSIS — E538 Deficiency of other specified B group vitamins: Secondary | ICD-10-CM | POA: Diagnosis not present

## 2012-03-08 DIAGNOSIS — E871 Hypo-osmolality and hyponatremia: Secondary | ICD-10-CM | POA: Diagnosis not present

## 2012-03-08 DIAGNOSIS — I10 Essential (primary) hypertension: Secondary | ICD-10-CM | POA: Diagnosis not present

## 2012-03-09 DIAGNOSIS — G459 Transient cerebral ischemic attack, unspecified: Secondary | ICD-10-CM | POA: Diagnosis not present

## 2012-03-09 DIAGNOSIS — R279 Unspecified lack of coordination: Secondary | ICD-10-CM | POA: Diagnosis not present

## 2012-03-09 DIAGNOSIS — R262 Difficulty in walking, not elsewhere classified: Secondary | ICD-10-CM | POA: Diagnosis not present

## 2012-03-09 DIAGNOSIS — M25673 Stiffness of unspecified ankle, not elsewhere classified: Secondary | ICD-10-CM | POA: Diagnosis not present

## 2012-03-09 DIAGNOSIS — I10 Essential (primary) hypertension: Secondary | ICD-10-CM | POA: Diagnosis not present

## 2012-03-10 DIAGNOSIS — R269 Unspecified abnormalities of gait and mobility: Secondary | ICD-10-CM | POA: Diagnosis not present

## 2012-03-10 DIAGNOSIS — F079 Unspecified personality and behavioral disorder due to known physiological condition: Secondary | ICD-10-CM | POA: Diagnosis not present

## 2012-05-18 DIAGNOSIS — I10 Essential (primary) hypertension: Secondary | ICD-10-CM | POA: Diagnosis not present

## 2012-05-18 DIAGNOSIS — F0390 Unspecified dementia without behavioral disturbance: Secondary | ICD-10-CM | POA: Diagnosis not present

## 2012-05-18 DIAGNOSIS — E871 Hypo-osmolality and hyponatremia: Secondary | ICD-10-CM | POA: Diagnosis not present

## 2012-05-18 DIAGNOSIS — L84 Corns and callosities: Secondary | ICD-10-CM | POA: Diagnosis not present

## 2012-05-18 DIAGNOSIS — E538 Deficiency of other specified B group vitamins: Secondary | ICD-10-CM | POA: Diagnosis not present

## 2012-05-18 DIAGNOSIS — R269 Unspecified abnormalities of gait and mobility: Secondary | ICD-10-CM | POA: Diagnosis not present

## 2012-06-01 DIAGNOSIS — H40019 Open angle with borderline findings, low risk, unspecified eye: Secondary | ICD-10-CM | POA: Diagnosis not present

## 2012-06-08 ENCOUNTER — Emergency Department (HOSPITAL_COMMUNITY)
Admission: EM | Admit: 2012-06-08 | Discharge: 2012-06-08 | Disposition: A | Discharge status: Skilled Nursing Facility | Payer: Medicare Other | Attending: Emergency Medicine | Admitting: Emergency Medicine

## 2012-06-08 ENCOUNTER — Encounter (HOSPITAL_COMMUNITY): Payer: Self-pay | Admitting: *Deleted

## 2012-06-08 DIAGNOSIS — L989 Disorder of the skin and subcutaneous tissue, unspecified: Secondary | ICD-10-CM | POA: Diagnosis not present

## 2012-06-08 DIAGNOSIS — I10 Essential (primary) hypertension: Secondary | ICD-10-CM | POA: Diagnosis not present

## 2012-06-08 DIAGNOSIS — S064X9A Epidural hemorrhage with loss of consciousness of unspecified duration, initial encounter: Secondary | ICD-10-CM | POA: Diagnosis not present

## 2012-06-08 DIAGNOSIS — R609 Edema, unspecified: Secondary | ICD-10-CM | POA: Diagnosis not present

## 2012-06-08 DIAGNOSIS — F79 Unspecified intellectual disabilities: Secondary | ICD-10-CM | POA: Diagnosis not present

## 2012-06-08 DIAGNOSIS — B029 Zoster without complications: Secondary | ICD-10-CM | POA: Diagnosis not present

## 2012-06-08 MED ORDER — VALACYCLOVIR HCL 1 G PO TABS: 1000.0000 mg | ORAL_TABLET | Freq: Three times a day (TID) | ORAL | Status: AC

## 2012-06-08 MED ORDER — VALACYCLOVIR HCL 500 MG PO TABS
1000.0000 mg | ORAL_TABLET | Freq: Once | ORAL | Status: AC
  Administered 2012-06-08: 1000 mg via ORAL
  Filled 2012-06-08: qty 2

## 2012-06-08 MED ORDER — TRAMADOL HCL 50 MG PO TABS: 50.0000 mg | ORAL_TABLET | Freq: Four times a day (QID) | ORAL | Status: AC | PRN

## 2012-06-08 NOTE — ED Notes (Signed)
[facility] called for transport, awaiting arrival.

## 2012-06-08 NOTE — ED Provider Notes (Signed)
History     CSN: [id number]  Arrival date & time 06/08/12  1054   First MD Initiated Contact with Patient 06/08/12 1116      Chief Complaint  Patient presents with  . Facial Swelling    (Consider location/radiation/quality/duration/timing/severity/associated sxs/prior treatment) HPI Comments: Jesus Ramsey is a 78 y.o. Male who presents for evaluation of swelling and rash on his right forehead. No known trauma. The patient denies blurred vision, eye pain, headache, weakness, or dizziness. No reported fever or chills. No reported nausea, or vomiting.  The history is provided by the patient.    Past Medical History  Diagnosis Date  . Mental retardation   . Hypertension   . Seizures   . Pneumonia   . Psychogenic polydipsia   . B12 deficiency anemia   . Cataract   . Altered mental status   . BPH (benign prostatic hypertrophy)     History reviewed. No pertinent past surgical history.  No family history on file.  History  Substance Use Topics  . Smoking status: Former Games developer  . Smokeless tobacco: Not on file  . Alcohol Use: No      Review of Systems  All other systems reviewed and are negative.    Allergies  Review of patient's allergies indicates no known allergies.  Home Medications   Current Outpatient Rx  Name Route Sig Dispense Refill  . ACETAMINOPHEN 325 MG PO TABS Oral Take 650 mg by mouth every 6 (six) hours as needed. pain    . ASPIRIN 325 MG PO TABS Oral Take 325 mg by mouth daily.    Marland Kitchen DOCUSATE SODIUM 100 MG PO CAPS Oral Take 100 mg by mouth 2 (two) times daily as needed. For constipation    . LISINOPRIL 10 MG PO TABS Oral Take 10 mg by mouth daily.     Marland Kitchen MAGNESIUM HYDROXIDE 400 MG/5ML PO SUSP Oral Take 30 mLs by mouth daily as needed. For constipation    . METOPROLOL TARTRATE 50 MG PO TABS Oral Take 50 mg by mouth 2 (two) times daily.     . SODIUM CHLORIDE 1 G PO TABS Oral Take 1 g by mouth every morning.    Marland Kitchen TAMSULOSIN HCL 0.4 MG PO CAPS Oral  Take 0.4 mg by mouth daily.       BP 148/67  Pulse 102  Temp 98.3 F (36.8 C) (Oral)  Resp 20  Wt 204 lb (92.534 kg)  SpO2 100%  Physical Exam  Nursing note and vitals reviewed. Constitutional: He is oriented to person, place, and time. He appears well-developed and well-nourished.  HENT:  Head: Normocephalic and atraumatic.  Right Ear: External ear normal.  Left Ear: External ear normal.       Edema right forehead, and right peri-orbital tissues. No crepitation, step-off or deformity.  Eyes: Conjunctivae and EOM are normal. Pupils are equal, round, and reactive to light. Right eye exhibits no discharge. Left eye exhibits no discharge. No scleral icterus.  Neck: Normal range of motion and phonation normal. Neck supple.  Cardiovascular: Normal heart sounds.   Pulmonary/Chest: Effort normal. He exhibits no bony tenderness.  Abdominal: Normal appearance.  Musculoskeletal: Normal range of motion.  Neurological: He is alert and oriented to person, place, and time. He has normal strength. No cranial nerve deficit or sensory deficit. He exhibits normal muscle tone. Coordination normal.  Skin: Skin is warm, dry and intact.       Right frontal scalp and right for head vesicular rash on  right base consistent with shingles.  Psychiatric: He has a normal mood and affect. His behavior is normal.    ED Course  Procedures (including critical care time)   Emergency department treatment- Valtrex. Oral   No diagnosis found.    MDM  Shingles in elderly male, without associated ophthalmic shingles by clinical exam. He is stable for discharge with outpatient management.   Plan: Home Medications- Valtrex; Home Treatments- rash care by daily soap and water clensing; Recommended follow up- PCP prn        Flint Melter, MD 06/08/12 1133

## 2012-06-08 NOTE — ED Notes (Signed)
Pt received from [facility] with c/o edema to right eye, pt comes from [facility].  Pt presents with edema to right eye, fluid filled blisters to right frontal area.

## 2012-06-08 NOTE — ED Notes (Signed)
Bed:WA23<BR> Expected date:<BR> Expected time:<BR> Means of arrival:<BR> Comments:<BR> EMS

## 2012-07-25 DIAGNOSIS — R972 Elevated prostate specific antigen [PSA]: Secondary | ICD-10-CM | POA: Diagnosis not present

## 2012-07-25 DIAGNOSIS — E538 Deficiency of other specified B group vitamins: Secondary | ICD-10-CM | POA: Diagnosis not present

## 2012-07-25 DIAGNOSIS — R634 Abnormal weight loss: Secondary | ICD-10-CM | POA: Diagnosis not present

## 2012-07-25 DIAGNOSIS — L84 Corns and callosities: Secondary | ICD-10-CM | POA: Diagnosis not present

## 2012-07-25 DIAGNOSIS — F0390 Unspecified dementia without behavioral disturbance: Secondary | ICD-10-CM | POA: Diagnosis not present

## 2012-08-22 DIAGNOSIS — F0390 Unspecified dementia without behavioral disturbance: Secondary | ICD-10-CM | POA: Diagnosis not present

## 2012-08-22 DIAGNOSIS — E538 Deficiency of other specified B group vitamins: Secondary | ICD-10-CM | POA: Diagnosis not present

## 2012-08-22 DIAGNOSIS — L84 Corns and callosities: Secondary | ICD-10-CM | POA: Diagnosis not present

## 2012-08-22 DIAGNOSIS — R634 Abnormal weight loss: Secondary | ICD-10-CM | POA: Diagnosis not present

## 2012-09-05 DIAGNOSIS — G459 Transient cerebral ischemic attack, unspecified: Secondary | ICD-10-CM | POA: Diagnosis not present

## 2012-09-05 DIAGNOSIS — M6281 Muscle weakness (generalized): Secondary | ICD-10-CM | POA: Diagnosis not present

## 2012-09-05 DIAGNOSIS — R269 Unspecified abnormalities of gait and mobility: Secondary | ICD-10-CM | POA: Diagnosis not present

## 2012-09-06 DIAGNOSIS — M6281 Muscle weakness (generalized): Secondary | ICD-10-CM | POA: Diagnosis not present

## 2012-09-06 DIAGNOSIS — R269 Unspecified abnormalities of gait and mobility: Secondary | ICD-10-CM | POA: Diagnosis not present

## 2012-09-06 DIAGNOSIS — G459 Transient cerebral ischemic attack, unspecified: Secondary | ICD-10-CM | POA: Diagnosis not present

## 2012-09-07 DIAGNOSIS — G459 Transient cerebral ischemic attack, unspecified: Secondary | ICD-10-CM | POA: Diagnosis not present

## 2012-09-07 DIAGNOSIS — R269 Unspecified abnormalities of gait and mobility: Secondary | ICD-10-CM | POA: Diagnosis not present

## 2012-09-07 DIAGNOSIS — M6281 Muscle weakness (generalized): Secondary | ICD-10-CM | POA: Diagnosis not present

## 2012-09-12 DIAGNOSIS — G459 Transient cerebral ischemic attack, unspecified: Secondary | ICD-10-CM | POA: Diagnosis not present

## 2012-09-12 DIAGNOSIS — R269 Unspecified abnormalities of gait and mobility: Secondary | ICD-10-CM | POA: Diagnosis not present

## 2012-09-12 DIAGNOSIS — M6281 Muscle weakness (generalized): Secondary | ICD-10-CM | POA: Diagnosis not present

## 2012-09-14 DIAGNOSIS — M6281 Muscle weakness (generalized): Secondary | ICD-10-CM | POA: Diagnosis not present

## 2012-09-14 DIAGNOSIS — G459 Transient cerebral ischemic attack, unspecified: Secondary | ICD-10-CM | POA: Diagnosis not present

## 2012-09-14 DIAGNOSIS — R269 Unspecified abnormalities of gait and mobility: Secondary | ICD-10-CM | POA: Diagnosis not present

## 2012-09-15 DIAGNOSIS — R269 Unspecified abnormalities of gait and mobility: Secondary | ICD-10-CM | POA: Diagnosis not present

## 2012-09-15 DIAGNOSIS — M6281 Muscle weakness (generalized): Secondary | ICD-10-CM | POA: Diagnosis not present

## 2012-09-15 DIAGNOSIS — G459 Transient cerebral ischemic attack, unspecified: Secondary | ICD-10-CM | POA: Diagnosis not present

## 2012-09-19 DIAGNOSIS — R269 Unspecified abnormalities of gait and mobility: Secondary | ICD-10-CM | POA: Diagnosis not present

## 2012-09-19 DIAGNOSIS — R634 Abnormal weight loss: Secondary | ICD-10-CM | POA: Diagnosis not present

## 2012-09-19 DIAGNOSIS — G459 Transient cerebral ischemic attack, unspecified: Secondary | ICD-10-CM | POA: Diagnosis not present

## 2012-09-19 DIAGNOSIS — L84 Corns and callosities: Secondary | ICD-10-CM | POA: Diagnosis not present

## 2012-09-19 DIAGNOSIS — F0390 Unspecified dementia without behavioral disturbance: Secondary | ICD-10-CM | POA: Diagnosis not present

## 2012-09-19 DIAGNOSIS — E538 Deficiency of other specified B group vitamins: Secondary | ICD-10-CM | POA: Diagnosis not present

## 2012-09-19 DIAGNOSIS — M6281 Muscle weakness (generalized): Secondary | ICD-10-CM | POA: Diagnosis not present

## 2012-09-20 DIAGNOSIS — R269 Unspecified abnormalities of gait and mobility: Secondary | ICD-10-CM | POA: Diagnosis not present

## 2012-09-20 DIAGNOSIS — M6281 Muscle weakness (generalized): Secondary | ICD-10-CM | POA: Diagnosis not present

## 2012-09-20 DIAGNOSIS — G459 Transient cerebral ischemic attack, unspecified: Secondary | ICD-10-CM | POA: Diagnosis not present

## 2012-09-22 DIAGNOSIS — R269 Unspecified abnormalities of gait and mobility: Secondary | ICD-10-CM | POA: Diagnosis not present

## 2012-09-22 DIAGNOSIS — G459 Transient cerebral ischemic attack, unspecified: Secondary | ICD-10-CM | POA: Diagnosis not present

## 2012-09-22 DIAGNOSIS — M6281 Muscle weakness (generalized): Secondary | ICD-10-CM | POA: Diagnosis not present

## 2012-09-26 DIAGNOSIS — M6281 Muscle weakness (generalized): Secondary | ICD-10-CM | POA: Diagnosis not present

## 2012-09-26 DIAGNOSIS — G459 Transient cerebral ischemic attack, unspecified: Secondary | ICD-10-CM | POA: Diagnosis not present

## 2012-09-26 DIAGNOSIS — R269 Unspecified abnormalities of gait and mobility: Secondary | ICD-10-CM | POA: Diagnosis not present

## 2012-09-27 DIAGNOSIS — M6281 Muscle weakness (generalized): Secondary | ICD-10-CM | POA: Diagnosis not present

## 2012-09-27 DIAGNOSIS — R269 Unspecified abnormalities of gait and mobility: Secondary | ICD-10-CM | POA: Diagnosis not present

## 2012-09-27 DIAGNOSIS — G459 Transient cerebral ischemic attack, unspecified: Secondary | ICD-10-CM | POA: Diagnosis not present

## 2012-09-28 DIAGNOSIS — G459 Transient cerebral ischemic attack, unspecified: Secondary | ICD-10-CM | POA: Diagnosis not present

## 2012-09-28 DIAGNOSIS — M6281 Muscle weakness (generalized): Secondary | ICD-10-CM | POA: Diagnosis not present

## 2012-09-28 DIAGNOSIS — R269 Unspecified abnormalities of gait and mobility: Secondary | ICD-10-CM | POA: Diagnosis not present

## 2012-10-03 DIAGNOSIS — R269 Unspecified abnormalities of gait and mobility: Secondary | ICD-10-CM | POA: Diagnosis not present

## 2012-10-03 DIAGNOSIS — G459 Transient cerebral ischemic attack, unspecified: Secondary | ICD-10-CM | POA: Diagnosis not present

## 2012-10-03 DIAGNOSIS — M6281 Muscle weakness (generalized): Secondary | ICD-10-CM | POA: Diagnosis not present

## 2012-10-04 DIAGNOSIS — G459 Transient cerebral ischemic attack, unspecified: Secondary | ICD-10-CM | POA: Diagnosis not present

## 2012-10-04 DIAGNOSIS — R269 Unspecified abnormalities of gait and mobility: Secondary | ICD-10-CM | POA: Diagnosis not present

## 2012-10-04 DIAGNOSIS — M6281 Muscle weakness (generalized): Secondary | ICD-10-CM | POA: Diagnosis not present

## 2012-10-07 DIAGNOSIS — R269 Unspecified abnormalities of gait and mobility: Secondary | ICD-10-CM | POA: Diagnosis not present

## 2012-10-07 DIAGNOSIS — G459 Transient cerebral ischemic attack, unspecified: Secondary | ICD-10-CM | POA: Diagnosis not present

## 2012-10-07 DIAGNOSIS — M6281 Muscle weakness (generalized): Secondary | ICD-10-CM | POA: Diagnosis not present

## 2012-10-10 DIAGNOSIS — R269 Unspecified abnormalities of gait and mobility: Secondary | ICD-10-CM | POA: Diagnosis not present

## 2012-10-10 DIAGNOSIS — G459 Transient cerebral ischemic attack, unspecified: Secondary | ICD-10-CM | POA: Diagnosis not present

## 2012-10-10 DIAGNOSIS — M6281 Muscle weakness (generalized): Secondary | ICD-10-CM | POA: Diagnosis not present

## 2012-10-11 DIAGNOSIS — G459 Transient cerebral ischemic attack, unspecified: Secondary | ICD-10-CM | POA: Diagnosis not present

## 2012-10-11 DIAGNOSIS — R269 Unspecified abnormalities of gait and mobility: Secondary | ICD-10-CM | POA: Diagnosis not present

## 2012-10-11 DIAGNOSIS — M6281 Muscle weakness (generalized): Secondary | ICD-10-CM | POA: Diagnosis not present

## 2012-10-13 DIAGNOSIS — R269 Unspecified abnormalities of gait and mobility: Secondary | ICD-10-CM | POA: Diagnosis not present

## 2012-10-13 DIAGNOSIS — M6281 Muscle weakness (generalized): Secondary | ICD-10-CM | POA: Diagnosis not present

## 2012-10-13 DIAGNOSIS — G459 Transient cerebral ischemic attack, unspecified: Secondary | ICD-10-CM | POA: Diagnosis not present

## 2012-10-17 DIAGNOSIS — G459 Transient cerebral ischemic attack, unspecified: Secondary | ICD-10-CM | POA: Diagnosis not present

## 2012-10-17 DIAGNOSIS — M6281 Muscle weakness (generalized): Secondary | ICD-10-CM | POA: Diagnosis not present

## 2012-10-17 DIAGNOSIS — R269 Unspecified abnormalities of gait and mobility: Secondary | ICD-10-CM | POA: Diagnosis not present

## 2012-11-28 DIAGNOSIS — E538 Deficiency of other specified B group vitamins: Secondary | ICD-10-CM | POA: Diagnosis not present

## 2012-11-28 DIAGNOSIS — R634 Abnormal weight loss: Secondary | ICD-10-CM | POA: Diagnosis not present

## 2012-11-28 DIAGNOSIS — E871 Hypo-osmolality and hyponatremia: Secondary | ICD-10-CM | POA: Diagnosis not present

## 2012-11-28 DIAGNOSIS — L84 Corns and callosities: Secondary | ICD-10-CM | POA: Diagnosis not present

## 2012-11-28 DIAGNOSIS — F0390 Unspecified dementia without behavioral disturbance: Secondary | ICD-10-CM | POA: Diagnosis not present

## 2012-11-28 DIAGNOSIS — R269 Unspecified abnormalities of gait and mobility: Secondary | ICD-10-CM | POA: Diagnosis not present

## 2013-02-01 DIAGNOSIS — H35369 Drusen (degenerative) of macula, unspecified eye: Secondary | ICD-10-CM | POA: Diagnosis not present

## 2013-02-01 DIAGNOSIS — H2510 Age-related nuclear cataract, unspecified eye: Secondary | ICD-10-CM | POA: Diagnosis not present

## 2013-02-01 DIAGNOSIS — H40019 Open angle with borderline findings, low risk, unspecified eye: Secondary | ICD-10-CM | POA: Diagnosis not present

## 2013-02-01 DIAGNOSIS — H02409 Unspecified ptosis of unspecified eyelid: Secondary | ICD-10-CM | POA: Diagnosis not present

## 2013-02-06 DIAGNOSIS — R634 Abnormal weight loss: Secondary | ICD-10-CM | POA: Diagnosis not present

## 2013-02-06 DIAGNOSIS — E538 Deficiency of other specified B group vitamins: Secondary | ICD-10-CM | POA: Diagnosis not present

## 2013-02-06 DIAGNOSIS — L84 Corns and callosities: Secondary | ICD-10-CM | POA: Diagnosis not present

## 2013-02-06 DIAGNOSIS — F0390 Unspecified dementia without behavioral disturbance: Secondary | ICD-10-CM | POA: Diagnosis not present

## 2013-02-06 DIAGNOSIS — R269 Unspecified abnormalities of gait and mobility: Secondary | ICD-10-CM | POA: Diagnosis not present

## 2013-02-06 DIAGNOSIS — E871 Hypo-osmolality and hyponatremia: Secondary | ICD-10-CM | POA: Diagnosis not present

## 2013-02-14 DIAGNOSIS — B351 Tinea unguium: Secondary | ICD-10-CM | POA: Diagnosis not present

## 2013-02-14 DIAGNOSIS — R269 Unspecified abnormalities of gait and mobility: Secondary | ICD-10-CM | POA: Diagnosis not present

## 2013-02-14 DIAGNOSIS — M79609 Pain in unspecified limb: Secondary | ICD-10-CM | POA: Diagnosis not present

## 2013-02-14 DIAGNOSIS — Q828 Other specified congenital malformations of skin: Secondary | ICD-10-CM | POA: Diagnosis not present

## 2013-04-04 DIAGNOSIS — F0390 Unspecified dementia without behavioral disturbance: Secondary | ICD-10-CM | POA: Diagnosis not present

## 2013-04-04 DIAGNOSIS — E871 Hypo-osmolality and hyponatremia: Secondary | ICD-10-CM | POA: Diagnosis not present

## 2013-04-04 DIAGNOSIS — R269 Unspecified abnormalities of gait and mobility: Secondary | ICD-10-CM | POA: Diagnosis not present

## 2013-04-04 DIAGNOSIS — E538 Deficiency of other specified B group vitamins: Secondary | ICD-10-CM | POA: Diagnosis not present

## 2013-04-04 DIAGNOSIS — L84 Corns and callosities: Secondary | ICD-10-CM | POA: Diagnosis not present

## 2013-05-09 DIAGNOSIS — M79609 Pain in unspecified limb: Secondary | ICD-10-CM | POA: Diagnosis not present

## 2013-05-09 DIAGNOSIS — Q828 Other specified congenital malformations of skin: Secondary | ICD-10-CM | POA: Diagnosis not present

## 2013-05-09 DIAGNOSIS — B351 Tinea unguium: Secondary | ICD-10-CM | POA: Diagnosis not present

## 2013-06-05 DIAGNOSIS — H40019 Open angle with borderline findings, low risk, unspecified eye: Secondary | ICD-10-CM | POA: Diagnosis not present

## 2013-06-05 DIAGNOSIS — H02409 Unspecified ptosis of unspecified eyelid: Secondary | ICD-10-CM | POA: Diagnosis not present

## 2013-07-03 DIAGNOSIS — E871 Hypo-osmolality and hyponatremia: Secondary | ICD-10-CM | POA: Diagnosis not present

## 2013-07-03 DIAGNOSIS — L84 Corns and callosities: Secondary | ICD-10-CM | POA: Diagnosis not present

## 2013-07-03 DIAGNOSIS — F0390 Unspecified dementia without behavioral disturbance: Secondary | ICD-10-CM | POA: Diagnosis not present

## 2013-07-03 DIAGNOSIS — R269 Unspecified abnormalities of gait and mobility: Secondary | ICD-10-CM | POA: Diagnosis not present

## 2013-07-03 DIAGNOSIS — E538 Deficiency of other specified B group vitamins: Secondary | ICD-10-CM | POA: Diagnosis not present

## 2013-10-04 DIAGNOSIS — E538 Deficiency of other specified B group vitamins: Secondary | ICD-10-CM | POA: Diagnosis not present

## 2013-10-04 DIAGNOSIS — I10 Essential (primary) hypertension: Secondary | ICD-10-CM | POA: Diagnosis not present

## 2013-10-04 DIAGNOSIS — R269 Unspecified abnormalities of gait and mobility: Secondary | ICD-10-CM | POA: Diagnosis not present

## 2013-10-04 DIAGNOSIS — E871 Hypo-osmolality and hyponatremia: Secondary | ICD-10-CM | POA: Diagnosis not present

## 2013-10-04 DIAGNOSIS — F0390 Unspecified dementia without behavioral disturbance: Secondary | ICD-10-CM | POA: Diagnosis not present

## 2013-10-04 DIAGNOSIS — L84 Corns and callosities: Secondary | ICD-10-CM | POA: Diagnosis not present

## 2013-10-17 ENCOUNTER — Ambulatory Visit: Payer: Self-pay

## 2013-10-18 ENCOUNTER — Encounter: Payer: Self-pay | Admitting: Podiatrist

## 2013-10-18 ENCOUNTER — Ambulatory Visit (INDEPENDENT_AMBULATORY_CARE_PROVIDER_SITE_OTHER): Payer: Medicare Other | Admitting: Podiatrist

## 2013-10-18 VITALS — BP 149/89 | HR 96 | Resp 12

## 2013-10-18 DIAGNOSIS — B351 Tinea unguium: Secondary | ICD-10-CM

## 2013-10-18 DIAGNOSIS — M79609 Pain in unspecified limb: Secondary | ICD-10-CM

## 2013-10-18 NOTE — Progress Notes (Signed)
HPI:  Patient presents today for follow up of foot and nail care. Denies any new complaints today.  Objective:  Patients chart is reviewed.  Neurovascular status unchanged.  Patients nails are thickened, discolored, distrophic, friable and brittle with yellow-brown discoloration. Patient subjectively relates they are painful with shoes and with ambulation of bilateral feet.  Assessment:  Symptomatic onychomycosis  Plan:  Discussed treatment options and alternatives.  The symptomatic toenails were debrided through manual an mechanical means without complication.  Return appointment recommended at routine intervals of 3 months    Kevin Space, DPM   

## 2014-01-15 DIAGNOSIS — H2510 Age-related nuclear cataract, unspecified eye: Secondary | ICD-10-CM | POA: Diagnosis not present

## 2014-01-15 DIAGNOSIS — H35039 Hypertensive retinopathy, unspecified eye: Secondary | ICD-10-CM | POA: Diagnosis not present

## 2014-01-15 DIAGNOSIS — H40029 Open angle with borderline findings, high risk, unspecified eye: Secondary | ICD-10-CM | POA: Diagnosis not present

## 2014-01-15 DIAGNOSIS — H25019 Cortical age-related cataract, unspecified eye: Secondary | ICD-10-CM | POA: Diagnosis not present

## 2014-01-15 DIAGNOSIS — I708 Atherosclerosis of other arteries: Secondary | ICD-10-CM | POA: Diagnosis not present

## 2014-01-15 DIAGNOSIS — H02409 Unspecified ptosis of unspecified eyelid: Secondary | ICD-10-CM | POA: Diagnosis not present

## 2014-01-17 ENCOUNTER — Ambulatory Visit: Payer: Medicare Other | Admitting: Podiatrist

## 2014-01-18 ENCOUNTER — Ambulatory Visit: Payer: Medicare Other | Admitting: Podiatrist

## 2014-01-31 DIAGNOSIS — R269 Unspecified abnormalities of gait and mobility: Secondary | ICD-10-CM | POA: Diagnosis not present

## 2014-01-31 DIAGNOSIS — E871 Hypo-osmolality and hyponatremia: Secondary | ICD-10-CM | POA: Diagnosis not present

## 2014-01-31 DIAGNOSIS — E538 Deficiency of other specified B group vitamins: Secondary | ICD-10-CM | POA: Diagnosis not present

## 2014-01-31 DIAGNOSIS — L84 Corns and callosities: Secondary | ICD-10-CM | POA: Diagnosis not present

## 2014-01-31 DIAGNOSIS — I10 Essential (primary) hypertension: Secondary | ICD-10-CM | POA: Diagnosis not present

## 2014-01-31 DIAGNOSIS — F0390 Unspecified dementia without behavioral disturbance: Secondary | ICD-10-CM | POA: Diagnosis not present

## 2014-02-01 ENCOUNTER — Encounter: Payer: Self-pay | Admitting: Podiatrist

## 2014-02-01 ENCOUNTER — Ambulatory Visit (INDEPENDENT_AMBULATORY_CARE_PROVIDER_SITE_OTHER): Payer: Medicare Other | Admitting: Podiatrist

## 2014-02-01 VITALS — BP 142/77 | HR 80 | Resp 12

## 2014-02-01 DIAGNOSIS — M79609 Pain in unspecified limb: Secondary | ICD-10-CM | POA: Diagnosis not present

## 2014-02-01 DIAGNOSIS — B351 Tinea unguium: Secondary | ICD-10-CM | POA: Diagnosis not present

## 2014-02-01 NOTE — Progress Notes (Signed)
HPI:  Patient presents today for follow up of foot and nail care. Denies any new complaints today.  Objective:  Patients chart is reviewed.  Neurovascular status unchanged.  Patients nails are thickened, discolored, distrophic, friable and brittle with yellow-brown discoloration. Patient subjectively relates they are painful with shoes and with ambulation of bilateral feet.  Assessment:  Symptomatic onychomycosis  Plan:  Discussed treatment options and alternatives.  The symptomatic toenails were debrided through manual an mechanical means without complication.  Return appointment recommended at routine intervals of 3 months    Ethne Jeon, DPM   

## 2014-04-04 DIAGNOSIS — E871 Hypo-osmolality and hyponatremia: Secondary | ICD-10-CM | POA: Diagnosis not present

## 2014-04-04 DIAGNOSIS — I10 Essential (primary) hypertension: Secondary | ICD-10-CM | POA: Diagnosis not present

## 2014-04-04 DIAGNOSIS — E538 Deficiency of other specified B group vitamins: Secondary | ICD-10-CM | POA: Diagnosis not present

## 2014-04-04 DIAGNOSIS — R269 Unspecified abnormalities of gait and mobility: Secondary | ICD-10-CM | POA: Diagnosis not present

## 2014-04-04 DIAGNOSIS — Z23 Encounter for immunization: Secondary | ICD-10-CM | POA: Diagnosis not present

## 2014-04-04 DIAGNOSIS — L84 Corns and callosities: Secondary | ICD-10-CM | POA: Diagnosis not present

## 2014-04-04 DIAGNOSIS — F0390 Unspecified dementia without behavioral disturbance: Secondary | ICD-10-CM | POA: Diagnosis not present

## 2014-05-15 DIAGNOSIS — H02409 Unspecified ptosis of unspecified eyelid: Secondary | ICD-10-CM | POA: Diagnosis not present

## 2014-05-15 DIAGNOSIS — H2510 Age-related nuclear cataract, unspecified eye: Secondary | ICD-10-CM | POA: Diagnosis not present

## 2014-05-15 DIAGNOSIS — H25019 Cortical age-related cataract, unspecified eye: Secondary | ICD-10-CM | POA: Diagnosis not present

## 2014-05-15 DIAGNOSIS — H4011X0 Primary open-angle glaucoma, stage unspecified: Secondary | ICD-10-CM | POA: Diagnosis not present

## 2014-05-31 ENCOUNTER — Encounter: Payer: Self-pay | Admitting: Podiatrist

## 2014-05-31 ENCOUNTER — Ambulatory Visit (INDEPENDENT_AMBULATORY_CARE_PROVIDER_SITE_OTHER): Payer: Medicare Other | Admitting: Podiatrist

## 2014-05-31 ENCOUNTER — Ambulatory Visit: Payer: Medicare Other | Admitting: Podiatrist

## 2014-05-31 DIAGNOSIS — M216X9 Other acquired deformities of unspecified foot: Secondary | ICD-10-CM

## 2014-05-31 DIAGNOSIS — L84 Corns and callosities: Secondary | ICD-10-CM

## 2014-05-31 DIAGNOSIS — Q828 Other specified congenital malformations of skin: Secondary | ICD-10-CM

## 2014-05-31 DIAGNOSIS — B351 Tinea unguium: Secondary | ICD-10-CM | POA: Diagnosis not present

## 2014-05-31 DIAGNOSIS — M79609 Pain in unspecified limb: Secondary | ICD-10-CM | POA: Diagnosis not present

## 2014-05-31 DIAGNOSIS — M79673 Pain in unspecified foot: Secondary | ICD-10-CM

## 2014-05-31 NOTE — Progress Notes (Signed)
   HPI: Patient presents today for follow up of foot and nail care. Denies any new complaints today.  Objective: Patients chart is reviewed. Neurovascular status unchanged. Patients nails are thickened, discolored, distrophic, friable and brittle with yellow-brown discoloration. Patient subjectively relates they are painful with shoes and with ambulation of bilateral feet.  He has very large hard calluses on the dorsal aspect of the right 5th toe and left 2nd toe.  He relates they are not bothering him today.   Assessment: Symptomatic onychomycosis   Plan: Discussed treatment options and alternatives. The symptomatic toenails were debrided through manual an mechanical means without complication.  Recommended debridement of the corns but he refused.  At the next visit will anesthetize the toes and try to remove the corns for him then.  Return appointment recommended at routine intervals of 3 months  Trudie Buckler, DPM

## 2014-07-09 DIAGNOSIS — E538 Deficiency of other specified B group vitamins: Secondary | ICD-10-CM | POA: Diagnosis not present

## 2014-07-09 DIAGNOSIS — R269 Unspecified abnormalities of gait and mobility: Secondary | ICD-10-CM | POA: Diagnosis not present

## 2014-07-09 DIAGNOSIS — E871 Hypo-osmolality and hyponatremia: Secondary | ICD-10-CM | POA: Diagnosis not present

## 2014-07-09 DIAGNOSIS — F0390 Unspecified dementia without behavioral disturbance: Secondary | ICD-10-CM | POA: Diagnosis not present

## 2014-07-09 DIAGNOSIS — L84 Corns and callosities: Secondary | ICD-10-CM | POA: Diagnosis not present

## 2014-07-09 DIAGNOSIS — I10 Essential (primary) hypertension: Secondary | ICD-10-CM | POA: Diagnosis not present

## 2014-07-24 ENCOUNTER — Emergency Department (HOSPITAL_COMMUNITY): Payer: Medicare Other

## 2014-07-24 ENCOUNTER — Inpatient Hospital Stay (HOSPITAL_COMMUNITY)
Admission: EM | Admit: 2014-07-24 | Discharge: 2014-07-26 | DRG: 641 | Disposition: A | Discharge status: Nursing Home (Includes ICF) | Payer: Medicare Other | Attending: Family Medicine | Admitting: Family Medicine

## 2014-07-24 ENCOUNTER — Encounter (HOSPITAL_COMMUNITY): Payer: Self-pay | Admitting: Emergency Medicine

## 2014-07-24 DIAGNOSIS — H269 Unspecified cataract: Secondary | ICD-10-CM | POA: Diagnosis present

## 2014-07-24 DIAGNOSIS — T68XXXA Hypothermia, initial encounter: Secondary | ICD-10-CM

## 2014-07-24 DIAGNOSIS — I959 Hypotension, unspecified: Secondary | ICD-10-CM | POA: Diagnosis present

## 2014-07-24 DIAGNOSIS — I10 Essential (primary) hypertension: Secondary | ICD-10-CM | POA: Diagnosis present

## 2014-07-24 DIAGNOSIS — E871 Hypo-osmolality and hyponatremia: Secondary | ICD-10-CM | POA: Diagnosis not present

## 2014-07-24 DIAGNOSIS — Z87891 Personal history of nicotine dependence: Secondary | ICD-10-CM

## 2014-07-24 DIAGNOSIS — R609 Edema, unspecified: Secondary | ICD-10-CM | POA: Diagnosis not present

## 2014-07-24 DIAGNOSIS — R631 Polydipsia: Secondary | ICD-10-CM | POA: Diagnosis present

## 2014-07-24 DIAGNOSIS — R5383 Other fatigue: Secondary | ICD-10-CM | POA: Diagnosis present

## 2014-07-24 DIAGNOSIS — R4182 Altered mental status, unspecified: Secondary | ICD-10-CM | POA: Diagnosis not present

## 2014-07-24 DIAGNOSIS — G40909 Epilepsy, unspecified, not intractable, without status epilepticus: Secondary | ICD-10-CM | POA: Diagnosis present

## 2014-07-24 DIAGNOSIS — R5381 Other malaise: Secondary | ICD-10-CM | POA: Diagnosis not present

## 2014-07-24 DIAGNOSIS — N400 Enlarged prostate without lower urinary tract symptoms: Secondary | ICD-10-CM | POA: Diagnosis present

## 2014-07-24 DIAGNOSIS — F54 Psychological and behavioral factors associated with disorders or diseases classified elsewhere: Secondary | ICD-10-CM | POA: Diagnosis present

## 2014-07-24 DIAGNOSIS — R269 Unspecified abnormalities of gait and mobility: Secondary | ICD-10-CM

## 2014-07-24 DIAGNOSIS — E538 Deficiency of other specified B group vitamins: Secondary | ICD-10-CM

## 2014-07-24 DIAGNOSIS — R404 Transient alteration of awareness: Secondary | ICD-10-CM | POA: Diagnosis not present

## 2014-07-24 DIAGNOSIS — Z7982 Long term (current) use of aspirin: Secondary | ICD-10-CM | POA: Diagnosis not present

## 2014-07-24 DIAGNOSIS — Z79899 Other long term (current) drug therapy: Secondary | ICD-10-CM

## 2014-07-24 DIAGNOSIS — F79 Unspecified intellectual disabilities: Secondary | ICD-10-CM | POA: Diagnosis present

## 2014-07-24 DIAGNOSIS — R29810 Facial weakness: Secondary | ICD-10-CM

## 2014-07-24 DIAGNOSIS — R680 Hypothermia, not associated with low environmental temperature: Secondary | ICD-10-CM | POA: Diagnosis present

## 2014-07-24 DIAGNOSIS — G459 Transient cerebral ischemic attack, unspecified: Secondary | ICD-10-CM | POA: Diagnosis not present

## 2014-07-24 HISTORY — DX: Hypo-osmolality and hyponatremia: E87.1

## 2014-07-24 LAB — CBC WITH DIFFERENTIAL/PLATELET
BASOS ABS: 0 10*3/uL (ref 0.0–0.1)
Basophils Relative: 0 % (ref 0–1)
EOS PCT: 1 % (ref 0–5)
Eosinophils Absolute: 0.1 10*3/uL (ref 0.0–0.7)
HCT: 35.4 % — ABNORMAL LOW (ref 39.0–52.0)
Hemoglobin: 12.1 g/dL — ABNORMAL LOW (ref 13.0–17.0)
Lymphocytes Relative: 31 % (ref 12–46)
Lymphs Abs: 2.1 10*3/uL (ref 0.7–4.0)
MCH: 29.7 pg (ref 26.0–34.0)
MCHC: 34.2 g/dL (ref 30.0–36.0)
MCV: 86.8 fL (ref 78.0–100.0)
Monocytes Absolute: 0.8 10*3/uL (ref 0.1–1.0)
Monocytes Relative: 12 % (ref 3–12)
NEUTROS PCT: 56 % (ref 43–77)
Neutro Abs: 3.7 10*3/uL (ref 1.7–7.7)
Platelets: 282 10*3/uL (ref 150–400)
RBC: 4.08 MIL/uL — ABNORMAL LOW (ref 4.22–5.81)
RDW: 13.1 % (ref 11.5–15.5)
WBC: 6.7 10*3/uL (ref 4.0–10.5)

## 2014-07-24 LAB — URINALYSIS, ROUTINE W REFLEX MICROSCOPIC
Bilirubin Urine: NEGATIVE
GLUCOSE, UA: NEGATIVE mg/dL
Hgb urine dipstick: NEGATIVE
KETONES UR: NEGATIVE mg/dL
Leukocytes, UA: NEGATIVE
Nitrite: NEGATIVE
PH: 7 (ref 5.0–8.0)
Protein, ur: NEGATIVE mg/dL
Specific Gravity, Urine: 1.019 (ref 1.005–1.030)
Urobilinogen, UA: 0.2 mg/dL (ref 0.0–1.0)

## 2014-07-24 LAB — COMPREHENSIVE METABOLIC PANEL
ALT: 14 U/L (ref 0–53)
AST: 22 U/L (ref 0–37)
Albumin: 3 g/dL — ABNORMAL LOW (ref 3.5–5.2)
Alkaline Phosphatase: 84 U/L (ref 39–117)
Anion gap: 10 (ref 5–15)
BUN: 17 mg/dL (ref 6–23)
CALCIUM: 9.4 mg/dL (ref 8.4–10.5)
CO2: 28 meq/L (ref 19–32)
Chloride: 91 mEq/L — ABNORMAL LOW (ref 96–112)
Creatinine, Ser: 0.77 mg/dL (ref 0.50–1.35)
GFR calc Af Amer: >90 mL/min (ref >90)
GFR calc non Af Amer: 83 mL/min — ABNORMAL LOW (ref >90)
Glucose, Bld: 84 mg/dL (ref 70–99)
POTASSIUM: 4.3 meq/L (ref 3.7–5.3)
SODIUM: 129 meq/L — AB (ref 137–147)
Total Bilirubin: 0.4 mg/dL (ref 0.3–1.2)
Total Protein: 6.9 g/dL (ref 6.0–8.3)

## 2014-07-24 LAB — I-STAT CHEM 8, ED
BUN: 17 mg/dL (ref 6–23)
CHLORIDE: 91 meq/L — AB (ref 96–112)
Calcium, Ion: 1.21 mmol/L (ref 1.13–1.30)
Creatinine, Ser: 1 mg/dL (ref 0.50–1.35)
Glucose, Bld: 85 mg/dL (ref 70–99)
HEMATOCRIT: 39 % (ref 39.0–52.0)
HEMOGLOBIN: 13.3 g/dL (ref 13.0–17.0)
POTASSIUM: 4.1 meq/L (ref 3.7–5.3)
Sodium: 126 mEq/L — ABNORMAL LOW (ref 137–147)
TCO2: 27 mmol/L (ref 0–100)

## 2014-07-24 LAB — TSH: TSH: 2.19 u[IU]/mL (ref 0.350–4.500)

## 2014-07-24 LAB — I-STAT TROPONIN, ED: TROPONIN I, POC: 0 ng/mL (ref 0.00–0.08)

## 2014-07-24 LAB — OSMOLALITY: OSMOLALITY: 271 mosm/kg — AB (ref 275–300)

## 2014-07-24 LAB — I-STAT CG4 LACTIC ACID, ED: Lactic Acid, Venous: 1.24 mmol/L (ref 0.5–2.2)

## 2014-07-24 LAB — PROTIME-INR
INR: 1.06 (ref 0.00–1.49)
PROTHROMBIN TIME: 13.8 s (ref 11.6–15.2)

## 2014-07-24 MED ORDER — TAMSULOSIN HCL 0.4 MG PO CAPS
0.4000 mg | ORAL_CAPSULE | Freq: Every day | ORAL | Status: DC
  Administered 2014-07-24 – 2014-07-26 (×3): 0.4 mg via ORAL
  Filled 2014-07-24 (×3): qty 1

## 2014-07-24 MED ORDER — ACETAMINOPHEN 650 MG RE SUPP: 650.0000 mg | Freq: Four times a day (QID) | RECTAL | Status: DC | PRN

## 2014-07-24 MED ORDER — SODIUM CHLORIDE 0.9 % IV SOLN
1000.0000 mL | INTRAVENOUS | Status: DC
  Administered 2014-07-24 – 2014-07-25 (×2): 1000 mL via INTRAVENOUS

## 2014-07-24 MED ORDER — PIPERACILLIN-TAZOBACTAM 3.375 G IVPB 30 MIN
3.3750 g | Freq: Once | INTRAVENOUS | Status: AC
  Administered 2014-07-24: 3.375 g via INTRAVENOUS
  Filled 2014-07-24: qty 50

## 2014-07-24 MED ORDER — ONDANSETRON HCL 4 MG/2ML IJ SOLN: 4.0000 mg | Freq: Four times a day (QID) | INTRAMUSCULAR | Status: DC | PRN

## 2014-07-24 MED ORDER — FINASTERIDE 5 MG PO TABS
5.0000 mg | ORAL_TABLET | Freq: Every day | ORAL | Status: DC
  Administered 2014-07-24 – 2014-07-26 (×3): 5 mg via ORAL
  Filled 2014-07-24 (×3): qty 1

## 2014-07-24 MED ORDER — VANCOMYCIN HCL IN DEXTROSE 1-5 GM/200ML-% IV SOLN
1000.0000 mg | Freq: Once | INTRAVENOUS | Status: AC
  Administered 2014-07-24: 1000 mg via INTRAVENOUS
  Filled 2014-07-24: qty 200

## 2014-07-24 MED ORDER — ONDANSETRON HCL 4 MG PO TABS: 4.0000 mg | ORAL_TABLET | Freq: Four times a day (QID) | ORAL | Status: DC | PRN

## 2014-07-24 MED ORDER — SODIUM CHLORIDE 0.9 % IV BOLUS (SEPSIS)
30.0000 mL/kg | Freq: Once | INTRAVENOUS | Status: AC
  Administered 2014-07-24: 1000 mL via INTRAVENOUS

## 2014-07-24 MED ORDER — ACETAMINOPHEN 325 MG PO TABS: 650.0000 mg | ORAL_TABLET | Freq: Four times a day (QID) | ORAL | Status: DC | PRN

## 2014-07-24 MED ORDER — ASPIRIN 81 MG PO CHEW
81.0000 mg | CHEWABLE_TABLET | Freq: Every day | ORAL | Status: DC
  Administered 2014-07-24 – 2014-07-26 (×3): 81 mg via ORAL
  Filled 2014-07-24 (×3): qty 1

## 2014-07-24 MED ORDER — ENOXAPARIN SODIUM 40 MG/0.4ML ~~LOC~~ SOLN
40.0000 mg | SUBCUTANEOUS | Status: DC
  Administered 2014-07-24 – 2014-07-25 (×2): 40 mg via SUBCUTANEOUS
  Filled 2014-07-24 (×2): qty 0.4

## 2014-07-24 MED ORDER — SODIUM CHLORIDE 1 G PO TABS
1.0000 g | ORAL_TABLET | Freq: Every morning | ORAL | Status: DC
  Administered 2014-07-24 – 2014-07-26 (×3): 1 g via ORAL
  Filled 2014-07-24 (×4): qty 1

## 2014-07-24 NOTE — ED Notes (Signed)
Patient is a resident at [facility] was found unresp. In a wheelchair. Upon EMS arrival states patient was only resp. To painful stimuli, however upon placing patient in the truck patient was resp and back to base line. Presently alert talking however confused.

## 2014-07-24 NOTE — ED Notes (Signed)
Checked patient temp. Rectal 96.57F, Oral 97.760F, Axillary 97.760F.

## 2014-07-24 NOTE — Progress Notes (Signed)
Bilateral lower extremity venous duplex completed:  No evidence of DVT, superficial thrombosis, or Baker's cyst.   

## 2014-07-24 NOTE — ED Notes (Addendum)
Brother Yvetta Coder) and sister-in-law were notified of pt's admission to the ED -- phone number to reach --- [phone] Sonia Baller Production designer, theatre/television/film at [place])  [phone]

## 2014-07-24 NOTE — ED Notes (Signed)
Pt incontinent of large amount of urine, gown and sheets soaked. Rectal temp = 95.63F

## 2014-07-24 NOTE — ED Notes (Signed)
Sonia Baller from [facility] called -- will call back with code status-- states one of the nurses had spoken to pt in the hallway, then she went into the med room, saw him 5-10 minutes later and pt was unresponsive, slumped over in w/c.

## 2014-07-24 NOTE — ED Notes (Signed)
MD at bedside. 

## 2014-07-24 NOTE — ED Notes (Signed)
Report given to Baxter Flattery, Therapist, sports on [employer]

## 2014-07-24 NOTE — ED Provider Notes (Signed)
CSN: [id number]     Arrival date & time 07/24/14  0520 History   First MD Initiated Contact with Patient 07/24/14 [phone]     Chief Complaint  Patient presents with  . Altered Mental Status     (Consider location/radiation/quality/duration/timing/severity/associated sxs/prior Treatment) HPI Comments: Patient is an 80 year old male with past medical history mental retardation, hypertension, seizures, pneumonia, psychogenic polydipsia, and B12 deficiency, cataracts and BPH who presents to the emergency department via EMS from [facility] with altered mental status. According to EMS, patient was unresponsive in a wheelchair. Unknown when last seen normal. He was responsive to painful stimuli any meds arrival, however when patient was placed into the ambulance truck, he was responsive and noted to be back to baseline. Patient currently denies any pain. Level 5 caveat due to altered mental status.  Patient is a 80 y.o. male presenting with altered mental status. The history is provided by the EMS personnel.  Altered Mental Status Presenting symptoms: confusion     Past Medical History  Diagnosis Date  . Mental retardation   . Hypertension   . Seizures   . Pneumonia   . Psychogenic polydipsia   . B12 deficiency anemia   . Cataract   . Altered mental status   . BPH (benign prostatic hypertrophy)    History reviewed. No pertinent past surgical history. History reviewed. No pertinent family history. History  Substance Use Topics  . Smoking status: Former Research scientist (life sciences)  . Smokeless tobacco: Not on file  . Alcohol Use: No    Review of Systems  Unable to perform ROS: Mental status change  Psychiatric/Behavioral: Positive for confusion.      Allergies  Review of patient's allergies indicates no known allergies.  Home Medications   Prior to Admission medications   Medication Sig Start Date End Date Taking? Authorizing Provider  acetaminophen (TYLENOL) 325 MG tablet Take 650 mg  by mouth every 6 (six) hours as needed. pain   Yes Historical Provider, MD  aspirin 81 MG chewable tablet Chew 81 mg by mouth daily.   Yes Historical Provider, MD  finasteride (PROSCAR) 5 MG tablet Take 5 mg by mouth daily.   Yes Historical Provider, MD  lisinopril (PRINIVIL,ZESTRIL) 10 MG tablet Take 10 mg by mouth daily.    Yes Historical Provider, MD  magnesium hydroxide (MILK OF MAGNESIA) 400 MG/5ML suspension Take 30 mLs by mouth daily as needed. For constipation   Yes Historical Provider, MD  metoprolol (LOPRESSOR) 50 MG tablet Take 75 mg by mouth 2 (two) times daily.    Yes Historical Provider, MD  sodium chloride 1 G tablet Take 1 g by mouth every morning.   Yes Historical Provider, MD  Tamsulosin HCl (FLOMAX) 0.4 MG CAPS Take 0.4 mg by mouth daily.    Yes Historical Provider, MD   BP 95/51  Pulse 83  Temp(Src) 95.1 F (35.1 C) (Rectal)  Resp 15  Ht 6\' 1"  (1.854 m)  Wt 150 lb (68.04 kg)  BMI 19.79 kg/m2  SpO2 99% Physical Exam  Nursing note and vitals reviewed. Constitutional: He is oriented to person, place, and time. He appears well-developed and well-nourished. No distress.  HENT:  Head: Normocephalic and atraumatic.  Mouth/Throat: Oropharynx is clear and moist.  Eyes: Conjunctivae and EOM are normal. Pupils are equal, round, and reactive to light.  Neck: Normal range of motion. Neck supple.  Cardiovascular: Normal rate, regular rhythm and normal heart sounds.   Pulmonary/Chest: Effort normal and breath sounds normal.  Abdominal:  Soft. Bowel sounds are normal. He exhibits no distension. There is no tenderness.  Musculoskeletal: Normal range of motion. He exhibits no edema.  Neurological: He is alert and oriented to person, place, and time. He has normal strength. No cranial nerve deficit or sensory deficit. GCS eye subscore is 4. GCS verbal subscore is 5. GCS motor subscore is 6.  AAOx3, however confused about why he is in ED and continuously asking for warm tray and to  pitch a softball.  Skin: Skin is dry. He is not diaphoretic.  Bilateral lower extremities cool, R>L, with non-pitting edema RLE. Intact equal distal pulses.  Psychiatric: He has a normal mood and affect. His behavior is normal.    ED Course  Procedures (including critical care time) Labs Review Labs Reviewed  CBC WITH DIFFERENTIAL - Abnormal; Notable for the following:    RBC 4.08 (*)    Hemoglobin 12.1 (*)    HCT 35.4 (*)    All other components within normal limits  COMPREHENSIVE METABOLIC PANEL - Abnormal; Notable for the following:    Sodium 129 (*)    Chloride 91 (*)    Albumin 3.0 (*)    GFR calc non Af Amer 83 (*)    All other components within normal limits  URINALYSIS, ROUTINE W REFLEX MICROSCOPIC - Abnormal; Notable for the following:    APPearance HAZY (*)    All other components within normal limits  I-STAT CHEM 8, ED - Abnormal; Notable for the following:    Sodium 126 (*)    Chloride 91 (*)    All other components within normal limits  CULTURE, BLOOD (ROUTINE X 2)  CULTURE, BLOOD (ROUTINE X 2)  URINE CULTURE  PROTIME-INR  I-STAT TROPOININ, ED  I-STAT CG4 LACTIC ACID, ED  I-STAT TROPOININ, ED    Imaging Review Ct Head Wo Contrast  07/24/2014   CLINICAL DATA:  Altered mental status  EXAM: CT HEAD WITHOUT CONTRAST  TECHNIQUE: Contiguous axial images were obtained from the base of the skull through the vertex without intravenous contrast.  COMPARISON:  January 13, 2012  FINDINGS: Moderate generalized atrophy is stable. There is no appreciable mass, hemorrhage, extra-axial fluid collection, or midline shift. There is mild patchy small vessel disease in the centra semiovale bilaterally, stable. No new gray-white compartment lesion identified. No acute infarct apparent. Bony calvarium appears intact. The mastoid air cells are clear. There is debris in both external auditory canals.  IMPRESSION: Moderate diffuse atrophy with mild periventricular small vessel disease. No  intracranial mass, hemorrhage, or acute appearing infarct. Probable cerumen in both external auditory canals.   Electronically Signed   By: Lowella Grip M.D.   On: 07/24/2014 07:11   Dg Chest Port 1 View  07/24/2014   CLINICAL DATA:  Altered mental status.  EXAM: PORTABLE CHEST - 1 VIEW  COMPARISON:  Chest radiograph from 07/25/2012  FINDINGS: The lungs are well-aerated and clear. There is no evidence of focal opacification, pleural effusion or pneumothorax.  The cardiomediastinal silhouette is within normal limits. No acute osseous abnormalities are seen.  IMPRESSION: No acute cardiopulmonary process seen.   Electronically Signed   By: Garald Balding M.D.   On: 07/24/2014 06:49     EKG Interpretation   Date/Time:  Tuesday July 24 2014 05:40:38 EDT Ventricular Rate:  62 PR Interval:    QRS Duration: 115 QT Interval:  429 QTC Calculation: 436 R Axis:   82 Text Interpretation:  Junctional rhythm Nonspecific intraventricular  conduction delay Borderline repolarization  abnormality Artifact limits  interpretation Confirmed by GOLDSTON  MD, SCOTT ([id number]) on 07/24/2014  7:37:41 AM      MDM   Final diagnoses:  Altered mental status, unspecified altered mental status type  Hyponatremia  Hypothermia, initial encounter   Pt presenting with AMS. He is AAOx3, however confused about why he is in ED. He is non-toxic appearing and in NAD. Temp 95 on arrival, no provider was notified about his temperature until my exam 45 minutes after arrival. On recheck, temp 93.7. Will place on bear hugger. RLE noted to be slightly swollen and a little colder than the right. Will obtain LE venous duplex. Intact distal pulses, cap refill < 3 seconds. Lungs clear. No focal neuro deficits. Given temp, level II code sepsis initiated. Also cannot rule out TIA.  8:30 AM Patient hyponatremic with a sodium of 126. Labs otherwise without any acute finding. Head CT and chest x-ray without any acute finding. Lower  extremity venous duplex still pending. Temp 95.1. Pt responding to bear hugger. Will admit for hyponatremia, AMS, hypothermia. Admission accepted by Dr. Eliseo Squires, [facility] to step-down bed.  Case discussed with attending Dr. Claudine Mouton who also evaluated patient and agrees with plan of care.   Illene Labrador, PA-C 07/24/14 [place], PA-C 07/24/14 [phone]

## 2014-07-24 NOTE — H&P (Signed)
[facility] History and Physical  Praneeth Bussey RAQ:[id number] DOB: 07/10/1931 DOA: 07/24/2014  Referring physician: er PCP: Henrine Screws, MD   Chief Complaint: AMS  HPI: Jesus Ramsey is a 80 y.o. male  Who lives in an ALF.  This AM, he was found in a wheelchair, unresponsive.  EMS was called when they arrived, he ws back to baseline.  He has  A past medical history of past medical history mental retardation, hypertension, seizures, pneumonia, psychogenic polydipsia, and B12 deficiency, cataracts and BPH Patient denies fever, chills, new medications, CP, SOB.  He is asking for breakfast and lunch- requesting collards, fried chicken, sausage   In the ER, he was hypothermic, and his Na was low.  One calf was larger than the other so duplex was done and was negative.   Hospitalist were asked to admit  Review of Systems:  All systems reviewed, negative unless stated above    Past Medical History  Diagnosis Date  . Mental retardation   . Hypertension   . Seizures   . Pneumonia   . Psychogenic polydipsia   . B12 deficiency anemia   . Cataract   . Altered mental status   . BPH (benign prostatic hypertrophy)    History reviewed. No pertinent past surgical history. Social History:  reports that he has quit smoking. He does not have any smokeless tobacco history on file. He reports that he does not drink alcohol or use illicit drugs.  No Known Allergies  History reviewed. No pertinent family history.   Prior to Admission medications   Medication Sig Start Date End Date Taking? Authorizing Provider  acetaminophen (TYLENOL) 325 MG tablet Take 650 mg by mouth every 6 (six) hours as needed. pain   Yes Historical Provider, MD  aspirin 81 MG chewable tablet Chew 81 mg by mouth daily.   Yes Historical Provider, MD  finasteride (PROSCAR) 5 MG tablet Take 5 mg by mouth daily.   Yes Historical Provider, MD  lisinopril (PRINIVIL,ZESTRIL) 10 MG tablet Take 10 mg by mouth daily.     Yes Historical Provider, MD  magnesium hydroxide (MILK OF MAGNESIA) 400 MG/5ML suspension Take 30 mLs by mouth daily as needed. For constipation   Yes Historical Provider, MD  metoprolol (LOPRESSOR) 50 MG tablet Take 75 mg by mouth 2 (two) times daily.    Yes Historical Provider, MD  sodium chloride 1 G tablet Take 1 g by mouth every morning.   Yes Historical Provider, MD  Tamsulosin HCl (FLOMAX) 0.4 MG CAPS Take 0.4 mg by mouth daily.    Yes Historical Provider, MD   Physical Exam: Filed Vitals:   07/24/14 0800 07/24/14 0812 07/24/14 1050 07/24/14 1111  BP:  95/51 95/51   Pulse: 83     Temp:   96 F (35.6 C) 97.7 F (36.5 C)  TempSrc:   Rectal   Resp: 15  17   Height:      Weight:      SpO2: 99%  100%     Wt Readings from Last 3 Encounters:  07/24/14 68.04 kg (150 lb)  06/08/12 92.534 kg (204 lb)  01/15/12 94.6 kg (208 lb 8.9 oz)    General:  Appears calm and comfortable- asking for food Eyes: PERRL, normal lids, irises & conjunctiva ENT: poor dentation Neck: no LAD, masses or thyromegaly Cardiovascular: RRR, no m/r/g. No LE edema. Telemetry: SR, no arrhythmias  Respiratory: CTA bilaterally, no w/r/r. Normal respiratory effort. Abdomen: soft, ntnd, thin Skin: no rash or induration  seen on limited exam Musculoskeletal: grossly normal tone BUE/BLE Neurologic: grossly non-focal.          Labs on Admission:  Basic Metabolic Panel:  Recent Labs Lab 07/24/14 0625 07/24/14 0632  NA 129* 126*  K 4.3 4.1  CL 91* 91*  CO2 28  --   GLUCOSE 84 85  BUN 17 17  CREATININE 0.77 1.00  CALCIUM 9.4  --    Liver Function Tests:  Recent Labs Lab 07/24/14 0625  AST 22  ALT 14  ALKPHOS 84  BILITOT 0.4  PROT 6.9  ALBUMIN 3.0*   No results found for this basename: LIPASE, AMYLASE,  in the last 168 hours No results found for this basename: AMMONIA,  in the last 168 hours CBC:  Recent Labs Lab 07/24/14 0625 07/24/14 0632  WBC 6.7  --   NEUTROABS 3.7  --   HGB  12.1* 13.3  HCT 35.4* 39.0  MCV 86.8  --   PLT 282  --    Cardiac Enzymes: No results found for this basename: CKTOTAL, CKMB, CKMBINDEX, TROPONINI,  in the last 168 hours  BNP (last 3 results) No results found for this basename: PROBNP,  in the last 8760 hours CBG: No results found for this basename: GLUCAP,  in the last 168 hours  Radiological Exams on Admission: Ct Head Wo Contrast  07/24/2014   CLINICAL DATA:  Altered mental status  EXAM: CT HEAD WITHOUT CONTRAST  TECHNIQUE: Contiguous axial images were obtained from the base of the skull through the vertex without intravenous contrast.  COMPARISON:  January 13, 2012  FINDINGS: Moderate generalized atrophy is stable. There is no appreciable mass, hemorrhage, extra-axial fluid collection, or midline shift. There is mild patchy small vessel disease in the centra semiovale bilaterally, stable. No new gray-white compartment lesion identified. No acute infarct apparent. Bony calvarium appears intact. The mastoid air cells are clear. There is debris in both external auditory canals.  IMPRESSION: Moderate diffuse atrophy with mild periventricular small vessel disease. No intracranial mass, hemorrhage, or acute appearing infarct. Probable cerumen in both external auditory canals.   Electronically Signed   By: Lowella Grip M.D.   On: 07/24/2014 07:11   Dg Chest Port 1 View  07/24/2014   CLINICAL DATA:  Altered mental status.  EXAM: PORTABLE CHEST - 1 VIEW  COMPARISON:  Chest radiograph from 07/25/2012  FINDINGS: The lungs are well-aerated and clear. There is no evidence of focal opacification, pleural effusion or pneumothorax.  The cardiomediastinal silhouette is within normal limits. No acute osseous abnormalities are seen.  IMPRESSION: No acute cardiopulmonary process seen.   Electronically Signed   By: Garald Balding M.D.   On: 07/24/2014 06:49     Assessment/Plan Active Problems:   Hyponatremia   Psychogenic polydipsia   Altered mental  status   Lethargy   Lethargy/AMS/period of unresponsiveness- patient back to normal, tele, cycle CE, no sign of infection -history of seizures  Hyponatremia- check urine osmos, serum osmos, fluid restrict, IVF  Hypotensive -hold BP meds -IVF  Hypothermic -was on bear hugger- improved -ck TSH  Code Status: full DVT Prophylaxis: Family Communication: sister in law on phone Disposition Plan:   Time spent:75 min  Eulogio Bear [facility] Pager [phone]

## 2014-07-24 NOTE — ED Provider Notes (Signed)
Patient presents for AMS from nursing home.  He was initially hypothermic in the ED, raising concern for sepsis.  Code 2 sepsis was called and patient will be admitted for continued evaluation.    Medical screening examination/treatment/procedure(s) were conducted as a shared visit with non-physician practitioner(s) and myself.  I personally evaluated the patient during the encounter.   EKG Interpretation   Date/Time:  Tuesday July 24 2014 05:40:38 EDT Ventricular Rate:  62 PR Interval:    QRS Duration: 115 QT Interval:  429 QTC Calculation: 436 R Axis:   82 Text Interpretation:  Junctional rhythm Nonspecific intraventricular  conduction delay Borderline repolarization abnormality Artifact limits  interpretation Confirmed by Regenia Skeeter  MD, SCOTT ([id number]) on 07/24/2014  7:37:41 AM        Everlene Balls, MD 07/24/14 2045

## 2014-07-25 LAB — BASIC METABOLIC PANEL
Anion gap: 11 (ref 5–15)
BUN: 12 mg/dL (ref 6–23)
CO2: 23 mEq/L (ref 19–32)
Calcium: 8.1 mg/dL — ABNORMAL LOW (ref 8.4–10.5)
Chloride: 97 mEq/L (ref 96–112)
Creatinine, Ser: 0.68 mg/dL (ref 0.50–1.35)
GFR calc Af Amer: >90 mL/min (ref >90)
GFR, EST NON AFRICAN AMERICAN: 87 mL/min — AB (ref >90)
GLUCOSE: 77 mg/dL (ref 70–99)
POTASSIUM: 4.1 meq/L (ref 3.7–5.3)
Sodium: 131 mEq/L — ABNORMAL LOW (ref 137–147)

## 2014-07-25 LAB — OSMOLALITY, URINE: Osmolality, Ur: 792 mOsm/kg (ref 390–1090)

## 2014-07-25 LAB — CBC
HEMATOCRIT: 30.3 % — AB (ref 39.0–52.0)
HEMOGLOBIN: 10.4 g/dL — AB (ref 13.0–17.0)
MCH: 29.8 pg (ref 26.0–34.0)
MCHC: 34.3 g/dL (ref 30.0–36.0)
MCV: 86.8 fL (ref 78.0–100.0)
Platelets: 248 10*3/uL (ref 150–400)
RBC: 3.49 MIL/uL — ABNORMAL LOW (ref 4.22–5.81)
RDW: 13.4 % (ref 11.5–15.5)
WBC: 4.7 10*3/uL (ref 4.0–10.5)

## 2014-07-25 LAB — URINE CULTURE
CULTURE: NO GROWTH
Colony Count: NO GROWTH

## 2014-07-25 NOTE — Clinical Social Work Note (Signed)
Clinical Social Work Department BRIEF PSYCHOSOCIAL ASSESSMENT 07/25/2014  Patient:  Jesus Ramsey,Jesus Ramsey     Account Number:  [id number]     Admit date:  07/24/2014  Clinical Social Worker:  Domenica Reamer, [place]  Date/Time:  07/25/2014 04:56 PM  Referred by:  Physician  Date Referred:  07/25/2014 Referred for  SNF Placement   Other Referral:   Interview type:  Family Other interview type:    PSYCHOSOCIAL DATA Living Status:  FACILITY Admitted from facility:   Level of care:  Assisted Living Primary support name:  Jesus Ramsey Primary support relationship to patient:  SIBLING Degree of support available:   Family reports high level of emotional support but low level of physical support available due to patients brother's own disability.    CURRENT CONCERNS Current Concerns  Post-Acute Placement   Other Concerns:    SOCIAL WORK ASSESSMENT / PLAN CSW spoke with patients family concerning SNF placment. Family agreeable to SNF.  CSW spoke with supervisor concerning patients appropriateness for SNF placement and informed that SNF placement is not necessary at this time and patient is able to return to assisted living for supervision and assistance in daily living. CSW spoke with assited living facility and is assessing whether or not patient can return.   Assessment/plan status:  Psychosocial Support/Ongoing Assessment of Needs Other assessment/ plan:   none   Information/referral to community resources:   [facility]    PATIENT'S/FAMILY'S RESPONSE TO PLAN OF CARE: Family is agreeable to placement but would like for patient to return to assisted living first.        Domenica Reamer, [place] Social Worker [phone]

## 2014-07-25 NOTE — Discharge Summary (Addendum)
Physician Discharge Summary  Jesus Ramsey XLK:[id number] DOB: 07/10/1931 DOA: 07/24/2014  PCP: Henrine Screws, MD  Admit date: 07/24/2014 Discharge date: 07/25/2014  Time spent: > 35  minutes  Recommendations for Outpatient Follow-up:  1. Please reassess BMP the next one week 2. Recommend following up with PCP within the next one to 2 weeks. At that time with recommend reassessing blood pressure and sodium levels. Antihypertensive medication should be adjusted accordingly. Given recent soft blood pressure we'll hold blood pressure medication on discharge  Discharge Diagnoses:  Active Problems:   Hyponatremia   Psychogenic polydipsia   Altered mental status   Lethargy   Discharge Condition: Stable  Diet recommendation: Regular diet  Filed Weights   07/24/14 0537 07/24/14 2037  Weight: 68.04 kg (150 lb) 64.3 kg (141 lb 12.1 oz)    History of present illness:  80 year old with history of seizures and MR who presented after an unresponsive state. Was found to have low sodium levels on ED evaluation. CT of head was negative except for mild periventricular small vessel disease  Hospital Course:  Active Problems:   Hyponatremia - Sodium levels improved with improvement in oral intake, IV fluids, and sodium tablets. As a result most likely cause from poor oral solute intake. - On discharge sodium level reported at 131 - Recommend rechecking within the next week after discharge - Agree with discharging to addendum: to assisted living facility    Altered mental status - Resolved    Lethargy - Resolved, patient at baseline and answering questions appropriately.  Hypertension - Given recent poor oral solute intake and soft blood pressures we'll hold antihypertensive regimen on discharge. This may be continued I outpatient PCP should blood pressures remain elevated.  BPH - Stable continue home regimen  Procedures:  Doppler of lower extremities which was negative for  DVT  Consultations:  None  Discharge Exam: Filed Vitals:   07/25/14 1320  BP: 142/80  Pulse: 75  Temp: 98 F (36.7 C)  Resp: 16    General: Patient in no acute distress, alert and awake Cardiovascular: Regular rate and rhythm, no murmurs rubs Respiratory: Clear to auscultation bilaterally, no wheezes, no increased work of breathing  Discharge Instructions You were cared for by a hospitalist during your hospital stay. If you have any questions about your discharge medications or the care you received while you were in the hospital after you are discharged, you can call the unit and asked to speak with the hospitalist on call if the hospitalist that took care of you is not available. Once you are discharged, your primary care physician will handle any further medical issues. Please note that NO REFILLS for any discharge medications will be authorized once you are discharged, as it is imperative that you return to your primary care physician (or establish a relationship with a primary care physician if you do not have one) for your aftercare needs so that they can reassess your need for medications and monitor your lab values.  Discharge Instructions   Call MD for:  redness, tenderness, or signs of infection (pain, swelling, redness, odor or green/yellow discharge around incision site)    Complete by:  As directed      Call MD for:  temperature >100.4    Complete by:  As directed      Diet - low sodium heart healthy    Complete by:  As directed      Increase activity slowly    Complete by:  As directed  Current Discharge Medication List    CONTINUE these medications which have NOT CHANGED   Details  acetaminophen (TYLENOL) 325 MG tablet Take 650 mg by mouth every 6 (six) hours as needed. pain    aspirin 81 MG chewable tablet Chew 81 mg by mouth daily.    finasteride (PROSCAR) 5 MG tablet Take 5 mg by mouth daily.    magnesium hydroxide (MILK OF MAGNESIA) 400 MG/5ML  suspension Take 30 mLs by mouth daily as needed. For constipation    sodium chloride 1 G tablet Take 1 g by mouth every morning.    Tamsulosin HCl (FLOMAX) 0.4 MG CAPS Take 0.4 mg by mouth daily.       STOP taking these medications     lisinopril (PRINIVIL,ZESTRIL) 10 MG tablet      metoprolol (LOPRESSOR) 50 MG tablet        No Known Allergies    The results of significant diagnostics from this hospitalization (including imaging, microbiology, ancillary and laboratory) are listed below for reference.    Significant Diagnostic Studies: Ct Head Wo Contrast  07/24/2014   CLINICAL DATA:  Altered mental status  EXAM: CT HEAD WITHOUT CONTRAST  TECHNIQUE: Contiguous axial images were obtained from the base of the skull through the vertex without intravenous contrast.  COMPARISON:  January 13, 2012  FINDINGS: Moderate generalized atrophy is stable. There is no appreciable mass, hemorrhage, extra-axial fluid collection, or midline shift. There is mild patchy small vessel disease in the centra semiovale bilaterally, stable. No new gray-white compartment lesion identified. No acute infarct apparent. Bony calvarium appears intact. The mastoid air cells are clear. There is debris in both external auditory canals.  IMPRESSION: Moderate diffuse atrophy with mild periventricular small vessel disease. No intracranial mass, hemorrhage, or acute appearing infarct. Probable cerumen in both external auditory canals.   Electronically Signed   By: Lowella Grip M.D.   On: 07/24/2014 07:11   Dg Chest Port 1 View  07/24/2014   CLINICAL DATA:  Altered mental status.  EXAM: PORTABLE CHEST - 1 VIEW  COMPARISON:  Chest radiograph from 07/25/2012  FINDINGS: The lungs are well-aerated and clear. There is no evidence of focal opacification, pleural effusion or pneumothorax.  The cardiomediastinal silhouette is within normal limits. No acute osseous abnormalities are seen.  IMPRESSION: No acute cardiopulmonary process  seen.   Electronically Signed   By: Garald Balding M.D.   On: 07/24/2014 06:49    Microbiology: Recent Results (from the past 240 hour(s))  CULTURE, BLOOD (ROUTINE X 2)     Status: None   Collection Time    07/24/14  6:30 AM      Result Value Ref Range Status   Specimen Description BLOOD ARM RIGHT   Final   Special Requests BOTTLES DRAWN AEROBIC AND ANAEROBIC 10CC   Final   Culture  Setup Time     Final   Value: 07/24/2014 11:19     Performed at [facility]   Culture     Final   Value:        BLOOD CULTURE RECEIVED NO GROWTH TO DATE CULTURE WILL BE HELD FOR 5 DAYS BEFORE ISSUING A FINAL NEGATIVE REPORT     Performed at [facility]   Report Status PENDING   Incomplete  CULTURE, BLOOD (ROUTINE X 2)     Status: None   Collection Time    07/24/14  6:37 AM      Result Value Ref Range Status   Specimen  Description BLOOD HAND RIGHT   Final   Special Requests BOTTLES DRAWN AEROBIC ONLY 5CC   Final   Culture  Setup Time     Final   Value: 07/24/2014 11:19     Performed at [facility]   Culture     Final   Value:        BLOOD CULTURE RECEIVED NO GROWTH TO DATE CULTURE WILL BE HELD FOR 5 DAYS BEFORE ISSUING A FINAL NEGATIVE REPORT     Performed at [facility]   Report Status PENDING   Incomplete  URINE CULTURE     Status: None   Collection Time    07/24/14  7:09 AM      Result Value Ref Range Status   Specimen Description URINE, RANDOM   Final   Special Requests NONE   Final   Culture  Setup Time     Final   Value: 07/24/2014 11:21     Performed at [facility]     Final   Value: NO GROWTH     Performed at [facility]   Culture     Final   Value: NO GROWTH     Performed at [facility]   Report Status 07/25/2014 FINAL   Final     Labs: Basic Metabolic Panel:  Recent Labs Lab 07/24/14 0625 07/24/14 0632 07/25/14 0352  NA 129* 126* 131*  K 4.3 4.1 4.1  CL 91* 91* 97  CO2 28  --  23  GLUCOSE 84  85 77  BUN 17 17 12   CREATININE 0.77 1.00 0.68  CALCIUM 9.4  --  8.1*   Liver Function Tests:  Recent Labs Lab 07/24/14 0625  AST 22  ALT 14  ALKPHOS 84  BILITOT 0.4  PROT 6.9  ALBUMIN 3.0*   No results found for this basename: LIPASE, AMYLASE,  in the last 168 hours No results found for this basename: AMMONIA,  in the last 168 hours CBC:  Recent Labs Lab 07/24/14 0625 07/24/14 0632 07/25/14 0352  WBC 6.7  --  4.7  NEUTROABS 3.7  --   --   HGB 12.1* 13.3 10.4*  HCT 35.4* 39.0 30.3*  MCV 86.8  --  86.8  PLT 282  --  248   Cardiac Enzymes: No results found for this basename: CKTOTAL, CKMB, CKMBINDEX, TROPONINI,  in the last 168 hours BNP: BNP (last 3 results) No results found for this basename: PROBNP,  in the last 8760 hours CBG: No results found for this basename: GLUCAP,  in the last 168 hours     Signed:  Velvet Bathe  [facility] 07/25/2014, 2:20 PM    Patient evaluated and ok for discharge  Naeem Quillin, [facility]

## 2014-07-25 NOTE — Clinical Social Work Note (Signed)
CSW received consult that patient was from [facility] assisted [facility].  CSW spoke with Sonia Baller at [facility] [phone]) who stated that patient was in the process of moving from assisted living to SNF and the patient had an updated FL2 as well as bed offers.   CSW attempted to contact patients brother- left a message.  Domenica Reamer, [place] Social Worker [phone]

## 2014-07-26 NOTE — Clinical Social Work Note (Signed)
CSW spoke with Jesus Ramsey at [facility] and patient is able to return to assisted living today.   Domenica Reamer, [place] Social Worker [phone]

## 2014-07-26 NOTE — Clinical Social Work Note (Signed)
Patient will discharge to [facility] [phone] Anticipated discharge date:07/26/14 Family notified: [name] (sister in [employer]) Transportation by [employer]- called at 2pm  CSW signing off.  Domenica Reamer, [place] Social Worker [phone]

## 2014-07-27 DIAGNOSIS — F70 Mild intellectual disabilities: Secondary | ICD-10-CM | POA: Diagnosis not present

## 2014-07-27 DIAGNOSIS — N401 Enlarged prostate with lower urinary tract symptoms: Secondary | ICD-10-CM | POA: Diagnosis not present

## 2014-07-27 DIAGNOSIS — M6281 Muscle weakness (generalized): Secondary | ICD-10-CM | POA: Diagnosis not present

## 2014-07-27 DIAGNOSIS — I10 Essential (primary) hypertension: Secondary | ICD-10-CM | POA: Diagnosis not present

## 2014-07-27 DIAGNOSIS — R32 Unspecified urinary incontinence: Secondary | ICD-10-CM | POA: Diagnosis not present

## 2014-07-27 DIAGNOSIS — F0390 Unspecified dementia without behavioral disturbance: Secondary | ICD-10-CM | POA: Diagnosis not present

## 2014-07-27 DIAGNOSIS — R279 Unspecified lack of coordination: Secondary | ICD-10-CM | POA: Diagnosis not present

## 2014-07-27 DIAGNOSIS — F54 Psychological and behavioral factors associated with disorders or diseases classified elsewhere: Secondary | ICD-10-CM | POA: Diagnosis not present

## 2014-07-27 DIAGNOSIS — R159 Full incontinence of feces: Secondary | ICD-10-CM | POA: Diagnosis not present

## 2014-07-27 DIAGNOSIS — R631 Polydipsia: Secondary | ICD-10-CM | POA: Diagnosis not present

## 2014-07-27 DIAGNOSIS — Z9181 History of falling: Secondary | ICD-10-CM | POA: Diagnosis not present

## 2014-07-27 DIAGNOSIS — E871 Hypo-osmolality and hyponatremia: Secondary | ICD-10-CM | POA: Diagnosis not present

## 2014-07-27 DIAGNOSIS — Z8673 Personal history of transient ischemic attack (TIA), and cerebral infarction without residual deficits: Secondary | ICD-10-CM | POA: Diagnosis not present

## 2014-07-30 DIAGNOSIS — I10 Essential (primary) hypertension: Secondary | ICD-10-CM | POA: Diagnosis not present

## 2014-07-30 DIAGNOSIS — F0390 Unspecified dementia without behavioral disturbance: Secondary | ICD-10-CM | POA: Diagnosis not present

## 2014-07-30 DIAGNOSIS — N401 Enlarged prostate with lower urinary tract symptoms: Secondary | ICD-10-CM | POA: Diagnosis not present

## 2014-07-30 DIAGNOSIS — N138 Other obstructive and reflux uropathy: Secondary | ICD-10-CM | POA: Diagnosis not present

## 2014-07-30 DIAGNOSIS — R279 Unspecified lack of coordination: Secondary | ICD-10-CM | POA: Diagnosis not present

## 2014-07-30 DIAGNOSIS — E871 Hypo-osmolality and hyponatremia: Secondary | ICD-10-CM | POA: Diagnosis not present

## 2014-07-30 DIAGNOSIS — R32 Unspecified urinary incontinence: Secondary | ICD-10-CM | POA: Diagnosis not present

## 2014-07-30 LAB — CULTURE, BLOOD (ROUTINE X 2)
CULTURE: NO GROWTH
Culture: NO GROWTH

## 2014-07-31 DIAGNOSIS — R32 Unspecified urinary incontinence: Secondary | ICD-10-CM | POA: Diagnosis not present

## 2014-07-31 DIAGNOSIS — E871 Hypo-osmolality and hyponatremia: Secondary | ICD-10-CM | POA: Diagnosis not present

## 2014-07-31 DIAGNOSIS — F0390 Unspecified dementia without behavioral disturbance: Secondary | ICD-10-CM | POA: Diagnosis not present

## 2014-07-31 DIAGNOSIS — N401 Enlarged prostate with lower urinary tract symptoms: Secondary | ICD-10-CM | POA: Diagnosis not present

## 2014-07-31 DIAGNOSIS — I10 Essential (primary) hypertension: Secondary | ICD-10-CM | POA: Diagnosis not present

## 2014-07-31 DIAGNOSIS — R279 Unspecified lack of coordination: Secondary | ICD-10-CM | POA: Diagnosis not present

## 2014-08-01 DIAGNOSIS — N401 Enlarged prostate with lower urinary tract symptoms: Secondary | ICD-10-CM | POA: Diagnosis not present

## 2014-08-01 DIAGNOSIS — R279 Unspecified lack of coordination: Secondary | ICD-10-CM | POA: Diagnosis not present

## 2014-08-01 DIAGNOSIS — R32 Unspecified urinary incontinence: Secondary | ICD-10-CM | POA: Diagnosis not present

## 2014-08-01 DIAGNOSIS — E871 Hypo-osmolality and hyponatremia: Secondary | ICD-10-CM | POA: Diagnosis not present

## 2014-08-01 DIAGNOSIS — I10 Essential (primary) hypertension: Secondary | ICD-10-CM | POA: Diagnosis not present

## 2014-08-01 DIAGNOSIS — F0390 Unspecified dementia without behavioral disturbance: Secondary | ICD-10-CM | POA: Diagnosis not present

## 2014-08-02 DIAGNOSIS — R279 Unspecified lack of coordination: Secondary | ICD-10-CM | POA: Diagnosis not present

## 2014-08-02 DIAGNOSIS — E871 Hypo-osmolality and hyponatremia: Secondary | ICD-10-CM | POA: Diagnosis not present

## 2014-08-02 DIAGNOSIS — I10 Essential (primary) hypertension: Secondary | ICD-10-CM | POA: Diagnosis not present

## 2014-08-02 DIAGNOSIS — R32 Unspecified urinary incontinence: Secondary | ICD-10-CM | POA: Diagnosis not present

## 2014-08-02 DIAGNOSIS — N401 Enlarged prostate with lower urinary tract symptoms: Secondary | ICD-10-CM | POA: Diagnosis not present

## 2014-08-02 DIAGNOSIS — F0390 Unspecified dementia without behavioral disturbance: Secondary | ICD-10-CM | POA: Diagnosis not present

## 2014-08-02 DIAGNOSIS — E876 Hypokalemia: Secondary | ICD-10-CM | POA: Diagnosis not present

## 2014-08-02 DIAGNOSIS — E550 Rickets, active: Secondary | ICD-10-CM | POA: Diagnosis not present

## 2014-08-03 DIAGNOSIS — R32 Unspecified urinary incontinence: Secondary | ICD-10-CM | POA: Diagnosis not present

## 2014-08-03 DIAGNOSIS — N138 Other obstructive and reflux uropathy: Secondary | ICD-10-CM | POA: Diagnosis not present

## 2014-08-03 DIAGNOSIS — N401 Enlarged prostate with lower urinary tract symptoms: Secondary | ICD-10-CM | POA: Diagnosis not present

## 2014-08-03 DIAGNOSIS — R279 Unspecified lack of coordination: Secondary | ICD-10-CM | POA: Diagnosis not present

## 2014-08-03 DIAGNOSIS — F0390 Unspecified dementia without behavioral disturbance: Secondary | ICD-10-CM | POA: Diagnosis not present

## 2014-08-03 DIAGNOSIS — E871 Hypo-osmolality and hyponatremia: Secondary | ICD-10-CM | POA: Diagnosis not present

## 2014-08-03 DIAGNOSIS — I10 Essential (primary) hypertension: Secondary | ICD-10-CM | POA: Diagnosis not present

## 2014-08-06 ENCOUNTER — Emergency Department (HOSPITAL_COMMUNITY)
Admission: EM | Admit: 2014-08-06 | Discharge: 2014-08-07 | Disposition: A | Discharge status: Skilled Nursing Facility | Payer: Medicare Other | Attending: Emergency Medicine | Admitting: Emergency Medicine

## 2014-08-06 ENCOUNTER — Encounter (HOSPITAL_COMMUNITY): Payer: Self-pay | Admitting: Emergency Medicine

## 2014-08-06 DIAGNOSIS — R9431 Abnormal electrocardiogram [ECG] [EKG]: Secondary | ICD-10-CM | POA: Diagnosis not present

## 2014-08-06 DIAGNOSIS — H547 Unspecified visual loss: Secondary | ICD-10-CM | POA: Insufficient documentation

## 2014-08-06 DIAGNOSIS — M7989 Other specified soft tissue disorders: Secondary | ICD-10-CM | POA: Diagnosis not present

## 2014-08-06 DIAGNOSIS — R32 Unspecified urinary incontinence: Secondary | ICD-10-CM | POA: Diagnosis not present

## 2014-08-06 DIAGNOSIS — S99929A Unspecified injury of unspecified foot, initial encounter: Principal | ICD-10-CM

## 2014-08-06 DIAGNOSIS — R279 Unspecified lack of coordination: Secondary | ICD-10-CM | POA: Diagnosis not present

## 2014-08-06 DIAGNOSIS — I10 Essential (primary) hypertension: Secondary | ICD-10-CM | POA: Insufficient documentation

## 2014-08-06 DIAGNOSIS — Y929 Unspecified place or not applicable: Secondary | ICD-10-CM | POA: Diagnosis not present

## 2014-08-06 DIAGNOSIS — R296 Repeated falls: Secondary | ICD-10-CM | POA: Diagnosis not present

## 2014-08-06 DIAGNOSIS — Z8701 Personal history of pneumonia (recurrent): Secondary | ICD-10-CM | POA: Diagnosis not present

## 2014-08-06 DIAGNOSIS — Z79899 Other long term (current) drug therapy: Secondary | ICD-10-CM | POA: Diagnosis not present

## 2014-08-06 DIAGNOSIS — Y939 Activity, unspecified: Secondary | ICD-10-CM | POA: Insufficient documentation

## 2014-08-06 DIAGNOSIS — S99919A Unspecified injury of unspecified ankle, initial encounter: Secondary | ICD-10-CM | POA: Diagnosis not present

## 2014-08-06 DIAGNOSIS — Z87448 Personal history of other diseases of urinary system: Secondary | ICD-10-CM | POA: Diagnosis not present

## 2014-08-06 DIAGNOSIS — M79609 Pain in unspecified limb: Secondary | ICD-10-CM | POA: Diagnosis not present

## 2014-08-06 DIAGNOSIS — N401 Enlarged prostate with lower urinary tract symptoms: Secondary | ICD-10-CM | POA: Diagnosis not present

## 2014-08-06 DIAGNOSIS — S8990XA Unspecified injury of unspecified lower leg, initial encounter: Secondary | ICD-10-CM | POA: Diagnosis not present

## 2014-08-06 DIAGNOSIS — E871 Hypo-osmolality and hyponatremia: Secondary | ICD-10-CM | POA: Diagnosis not present

## 2014-08-06 DIAGNOSIS — Z87891 Personal history of nicotine dependence: Secondary | ICD-10-CM | POA: Diagnosis not present

## 2014-08-06 DIAGNOSIS — Z7982 Long term (current) use of aspirin: Secondary | ICD-10-CM | POA: Insufficient documentation

## 2014-08-06 DIAGNOSIS — F0390 Unspecified dementia without behavioral disturbance: Secondary | ICD-10-CM | POA: Diagnosis not present

## 2014-08-06 DIAGNOSIS — W19XXXA Unspecified fall, initial encounter: Secondary | ICD-10-CM

## 2014-08-06 LAB — BASIC METABOLIC PANEL
Anion gap: 10 (ref 5–15)
BUN: 17 mg/dL (ref 6–23)
CALCIUM: 9.1 mg/dL (ref 8.4–10.5)
CO2: 28 mEq/L (ref 19–32)
Chloride: 91 mEq/L — ABNORMAL LOW (ref 96–112)
Creatinine, Ser: 0.7 mg/dL (ref 0.50–1.35)
GFR calc Af Amer: >90 mL/min (ref >90)
GFR, EST NON AFRICAN AMERICAN: 86 mL/min — AB (ref >90)
GLUCOSE: 94 mg/dL (ref 70–99)
Potassium: 4.2 mEq/L (ref 3.7–5.3)
Sodium: 129 mEq/L — ABNORMAL LOW (ref 137–147)

## 2014-08-06 LAB — CBC WITH DIFFERENTIAL/PLATELET
Basophils Absolute: 0 10*3/uL (ref 0.0–0.1)
Basophils Relative: 0 % (ref 0–1)
EOS ABS: 0.1 10*3/uL (ref 0.0–0.7)
Eosinophils Relative: 0 % (ref 0–5)
HCT: 36.5 % — ABNORMAL LOW (ref 39.0–52.0)
HEMOGLOBIN: 12.3 g/dL — AB (ref 13.0–17.0)
LYMPHS ABS: 1.6 10*3/uL (ref 0.7–4.0)
Lymphocytes Relative: 13 % (ref 12–46)
MCH: 30.1 pg (ref 26.0–34.0)
MCHC: 33.7 g/dL (ref 30.0–36.0)
MCV: 89.5 fL (ref 78.0–100.0)
MONOS PCT: 6 % (ref 3–12)
Monocytes Absolute: 0.8 10*3/uL (ref 0.1–1.0)
Neutro Abs: 10.6 10*3/uL — ABNORMAL HIGH (ref 1.7–7.7)
Neutrophils Relative %: 81 % — ABNORMAL HIGH (ref 43–77)
PLATELETS: 316 10*3/uL (ref 150–400)
RBC: 4.08 MIL/uL — AB (ref 4.22–5.81)
RDW: 13.4 % (ref 11.5–15.5)
WBC: 13 10*3/uL — ABNORMAL HIGH (ref 4.0–10.5)

## 2014-08-06 LAB — URINALYSIS, ROUTINE W REFLEX MICROSCOPIC
Bilirubin Urine: NEGATIVE
Glucose, UA: NEGATIVE mg/dL
KETONES UR: NEGATIVE mg/dL
Leukocytes, UA: NEGATIVE
Nitrite: NEGATIVE
PROTEIN: NEGATIVE mg/dL
Specific Gravity, Urine: 1.023 (ref 1.005–1.030)
Urobilinogen, UA: 1 mg/dL (ref 0.0–1.0)
pH: 6.5 (ref 5.0–8.0)

## 2014-08-06 LAB — URINE MICROSCOPIC-ADD ON

## 2014-08-06 NOTE — Discharge Instructions (Signed)
IMPORTANT PATIENT INSTRUCTIONS:   You have been scheduled for an Outpatient Vascular Study at [facility].    If tomorrow is a Saturday or Sunday, please go to the [facility] Emergency Department registration desk at 8 AM tomorrow morning and tell them you are therefore a vascular study.  If tomorrow is a weekday (Monday - Friday), please go to the [place] Department at 8 AM and tell them you are therefore a vascular study    Fall Prevention and Home Safety Falls cause injuries and can affect all age groups. It is possible to use preventive measures to significantly decrease the likelihood of falls. There are many simple measures which can make your home safer and prevent falls. OUTDOORS  Repair cracks and edges of walkways and driveways.  Remove high doorway thresholds.  Trim shrubbery on the main path into your home.  Have good outside lighting.  Clear walkways of tools, rocks, debris, and clutter.  Check that handrails are not broken and are securely fastened. Both sides of steps should have handrails.  Have leaves, snow, and ice cleared regularly.  Use sand or salt on walkways during winter months.  In the garage, clean up grease or oil spills. BATHROOM  Install night lights.  Install grab bars by the toilet and in the tub and shower.  Use non-skid mats or decals in the tub or shower.  Place a plastic non-slip stool in the shower to sit on, if needed.  Keep floors dry and clean up all water on the floor immediately.  Remove soap buildup in the tub or shower on a regular basis.  Secure bath mats with non-slip, double-sided rug tape.  Remove throw rugs and tripping hazards from the floors. BEDROOMS  Install night lights.  Make sure a bedside light is easy to reach.  Do not use oversized bedding.  Keep a telephone by your bedside.  Have a firm chair with side arms to use for getting dressed.  Remove throw rugs and tripping hazards from  the floor. KITCHEN  Keep handles on pots and pans turned toward the center of the stove. Use back burners when possible.  Clean up spills quickly and allow time for drying.  Avoid walking on wet floors.  Avoid hot utensils and knives.  Position shelves so they are not too high or low.  Place commonly used objects within easy reach.  If necessary, use a sturdy step stool with a grab bar when reaching.  Keep electrical cables out of the way.  Do not use floor polish or wax that makes floors slippery. If you must use wax, use non-skid floor wax.  Remove throw rugs and tripping hazards from the floor. STAIRWAYS  Never leave objects on stairs.  Place handrails on both sides of stairways and use them. Fix any loose handrails. Make sure handrails on both sides of the stairways are as long as the stairs.  Check carpeting to make sure it is firmly attached along stairs. Make repairs to worn or loose carpet promptly.  Avoid placing throw rugs at the top or bottom of stairways, or properly secure the rug with carpet tape to prevent slippage. Get rid of throw rugs, if possible.  Have an electrician put in a light switch at the top and bottom of the stairs. OTHER FALL PREVENTION TIPS  Wear low-heel or rubber-soled shoes that are supportive and fit well. Wear closed toe shoes.  When using a stepladder, make sure it is fully opened  and both spreaders are firmly locked. Do not climb a closed stepladder.  Add color or contrast paint or tape to grab bars and handrails in your home. Place contrasting color strips on first and last steps.  Learn and use mobility aids as needed. Install an electrical emergency response system.  Turn on lights to avoid dark areas. Replace light bulbs that burn out immediately. Get light switches that glow.  Arrange furniture to create clear pathways. Keep furniture in the same place.  Firmly attach carpet with non-skid or double-sided tape.  Eliminate  uneven floor surfaces.  Select a carpet pattern that does not visually hide the edge of steps.  Be aware of all pets. OTHER HOME SAFETY TIPS  Set the water temperature for 120 F (48.8 C).  Keep emergency numbers on or near the telephone.  Keep smoke detectors on every level of the home and near sleeping areas. Document Released: 10/16/2002 Document Revised: 04/26/2012 Document Reviewed: 01/15/2012 [facility] Patient Information 2015 [place], [place]. This information is not intended to replace advice given to you by your health care provider. Make sure you discuss any questions you have with your health care provider.

## 2014-08-06 NOTE — ED Notes (Signed)
Bed: WA04 Expected date:  Expected time:  Means of arrival:  Comments: EMS 80yo Fall from [facility]

## 2014-08-06 NOTE — ED Notes (Signed)
Pt wet with urine as well as foul odor of old urine upon arrival. Pt cleaned by ED staff. Pt has skin breakdown to inner thighs.

## 2014-08-06 NOTE — ED Provider Notes (Signed)
CSN: [id number]     Arrival date & time 08/06/14  2019 History   First MD Initiated Contact with Patient 08/06/14 2035     Chief Complaint  Patient presents with  . Fall     (Consider location/radiation/quality/duration/timing/severity/associated sxs/prior Treatment) HPI Comments: 80 yo male who presents from his nursing facility secondary to being found on the floor.  Pt reports he slid down to the ground and landed on his rear end.  He denies any injuries or pain.  He did not hit his head or lose consciousness.    Patient is a 80 y.o. male presenting with fall.  Fall This is a new problem. Episode onset: today. Episode frequency: once. The problem has been resolved. Pertinent negatives include no chest pain, no abdominal pain, no headaches and no shortness of breath. Nothing aggravates the symptoms.    Past Medical History  Diagnosis Date  . Mental retardation   . Hypertension   . Seizures   . Pneumonia   . Psychogenic polydipsia   . B12 deficiency anemia   . Cataract   . Altered mental status   . BPH (benign prostatic hypertrophy)   . Chronic hyponatremia     /notes 01/13/2012   Past Surgical History  Procedure Laterality Date  . No past surgeries     History reviewed. No pertinent family history. History  Substance Use Topics  . Smoking status: Former Research scientist (life sciences)  . Smokeless tobacco: Not on file  . Alcohol Use: No    Review of Systems  Respiratory: Negative for shortness of breath.   Cardiovascular: Negative for chest pain.  Gastrointestinal: Negative for abdominal pain.  Neurological: Negative for headaches.  All other systems reviewed and are negative.     Allergies  Review of patient's allergies indicates no known allergies.  Home Medications   Prior to Admission medications   Medication Sig Start Date End Date Taking? Authorizing Provider  acetaminophen (TYLENOL) 325 MG tablet Take 650 mg by mouth every 6 (six) hours as needed. pain   Yes Historical  Provider, MD  aspirin 81 MG chewable tablet Chew 81 mg by mouth daily.   Yes Historical Provider, MD  finasteride (PROSCAR) 5 MG tablet Take 5 mg by mouth daily.   Yes Historical Provider, MD  lisinopril (PRINIVIL,ZESTRIL) 10 MG tablet Take 10 mg by mouth daily.   Yes Historical Provider, MD  magnesium hydroxide (MILK OF MAGNESIA) 400 MG/5ML suspension Take 30 mLs by mouth daily as needed. For constipation   Yes Historical Provider, MD  sodium chloride 1 G tablet Take 1 g by mouth every morning.   Yes Historical Provider, MD  Tamsulosin HCl (FLOMAX) 0.4 MG CAPS Take 0.4 mg by mouth daily.    Yes Historical Provider, MD   BP 121/61  Pulse 77  Temp(Src) 97.6 F (36.4 C) (Oral)  Resp 18  SpO2 100% Physical Exam  Nursing note and vitals reviewed. Constitutional: He is oriented to person, place, and time. He appears well-developed and well-nourished. No distress.  HENT:  Head: Normocephalic and atraumatic. Head is without raccoon's eyes and without Battle's sign.  Nose: Nose normal.  Eyes: Conjunctivae and EOM are normal. Pupils are equal, round, and reactive to light. No scleral icterus.  Neck: No spinous process tenderness and no muscular tenderness present.  Cardiovascular: Normal rate, regular rhythm, normal heart sounds and intact distal pulses.   No murmur heard. Pulmonary/Chest: Effort normal and breath sounds normal. He has no rales. He exhibits no tenderness.  Abdominal:  Soft. There is no tenderness. There is no rebound and no guarding.  Musculoskeletal: Normal range of motion. He exhibits no edema and no tenderness.       Thoracic back: He exhibits no tenderness and no bony tenderness.       Lumbar back: He exhibits no tenderness and no bony tenderness.  RLE: 1+ pitting edema.  Foot is cold to touch compared to left foot.  Pt has easily dopplerable pulses and faintly palpable pulses through his edema. Motor function and sensation are intact.  LLE: no edema, normal pulses, normal  temperature  No evidence of trauma to extremities, except as noted above.  2+ distal pulses except as noted above.     Neurological: He is alert and oriented to person, place, and time.  Skin: Skin is warm and dry. No rash noted.  Psychiatric: He has a normal mood and affect.    ED Course  Procedures (including critical care time) Labs Review Labs Reviewed  CBC WITH DIFFERENTIAL - Abnormal; Notable for the following:    WBC 13.0 (*)    RBC 4.08 (*)    Hemoglobin 12.3 (*)    HCT 36.5 (*)    Neutrophils Relative % 81 (*)    Neutro Abs 10.6 (*)    All other components within normal limits  BASIC METABOLIC PANEL - Abnormal; Notable for the following:    Sodium 129 (*)    Chloride 91 (*)    GFR calc non Af Amer 86 (*)    All other components within normal limits  URINALYSIS, ROUTINE W REFLEX MICROSCOPIC - Abnormal; Notable for the following:    Hgb urine dipstick TRACE (*)    All other components within normal limits  URINE MICROSCOPIC-ADD ON    Imaging Review No results found.   EKG Interpretation   Date/Time:  Monday August 06 2014 21:41:34 EDT Ventricular Rate:  76 PR Interval:  136 QRS Duration: 82 QT Interval:  371 QTC Calculation: 417 R Axis:   77 Text Interpretation:  Sinus rhythm Abnormal R-wave progression, early  transition Baseline wander in lead(s) V3 compared to prior, more clearly  sinus rhythm Confirmed by [facility]  MD, TREY ([id number]) on 08/06/2014 11:22:53  PM      MDM   Final diagnoses:  Swollen leg  Fall, initial encounter    80 yo male who slid to the floor from his bed, landing on his bottom.  He denies pain or injuries.  Head atraumatic.  Exam is most notable for a cold swollen leg.  He had a recent hospitalization during which time he had a negative venous duplex to evaluate for swelling in this right leg.   Informal bedside ultrasound done by me showed good compression of his femoral vein.  He had intact pulses in his foot.  Symptoms could  represent Reflex Sympathetic Dystrophy.  However, will order doppler studies of his right leg to be performed tomorrow.  (Unable to get these done tonight).  I don't think putting him on blood thinners is appropriate, given his presentation for fall.  Pt has chronic hyponatremia, seen again on today's labs.  He has a mild leukocytosis without symptoms of infection.  Plan dc home to his nursing facility with vascular studies tomorrow and PCP follow up.     Houston Siren III, MD 08/07/14 [phone]

## 2014-08-06 NOTE — ED Notes (Signed)
Per [facility] staff states they found pt in the floor. Pt attempting to get to restroom. Pt has no complaints at this time. States "there is nothing wrong." Pt is MR and difficult to understand.

## 2014-08-07 ENCOUNTER — Ambulatory Visit (HOSPITAL_COMMUNITY)
Admission: RE | Admit: 2014-08-07 | Discharge: 2014-08-07 | Disposition: A | Discharge status: Home / Self Care | Payer: Medicare Other | Source: Ambulatory Visit | Attending: Emergency Medicine | Admitting: Emergency Medicine

## 2014-08-07 DIAGNOSIS — N138 Other obstructive and reflux uropathy: Secondary | ICD-10-CM | POA: Diagnosis not present

## 2014-08-07 DIAGNOSIS — M79609 Pain in unspecified limb: Secondary | ICD-10-CM | POA: Diagnosis not present

## 2014-08-07 DIAGNOSIS — S99919A Unspecified injury of unspecified ankle, initial encounter: Secondary | ICD-10-CM | POA: Diagnosis not present

## 2014-08-07 DIAGNOSIS — I70209 Unspecified atherosclerosis of native arteries of extremities, unspecified extremity: Secondary | ICD-10-CM | POA: Insufficient documentation

## 2014-08-07 DIAGNOSIS — I10 Essential (primary) hypertension: Secondary | ICD-10-CM | POA: Diagnosis not present

## 2014-08-07 DIAGNOSIS — R279 Unspecified lack of coordination: Secondary | ICD-10-CM | POA: Diagnosis not present

## 2014-08-07 DIAGNOSIS — R599 Enlarged lymph nodes, unspecified: Secondary | ICD-10-CM | POA: Diagnosis not present

## 2014-08-07 DIAGNOSIS — I739 Peripheral vascular disease, unspecified: Secondary | ICD-10-CM | POA: Diagnosis not present

## 2014-08-07 DIAGNOSIS — N401 Enlarged prostate with lower urinary tract symptoms: Secondary | ICD-10-CM | POA: Diagnosis not present

## 2014-08-07 DIAGNOSIS — E871 Hypo-osmolality and hyponatremia: Secondary | ICD-10-CM | POA: Diagnosis not present

## 2014-08-07 DIAGNOSIS — R609 Edema, unspecified: Secondary | ICD-10-CM | POA: Diagnosis not present

## 2014-08-07 DIAGNOSIS — S8990XA Unspecified injury of unspecified lower leg, initial encounter: Secondary | ICD-10-CM | POA: Diagnosis not present

## 2014-08-07 DIAGNOSIS — F0390 Unspecified dementia without behavioral disturbance: Secondary | ICD-10-CM | POA: Diagnosis not present

## 2014-08-07 DIAGNOSIS — R32 Unspecified urinary incontinence: Secondary | ICD-10-CM | POA: Diagnosis not present

## 2014-08-07 NOTE — Progress Notes (Signed)
Right lower extremity arterial duplex completed.  Right:  0-19% stenosis noted in the common femoral artery.  20-49% stenosis noted in the femoral artery, popliteal artery, and posterior tibial artery.  Left:  1-19% stenosis noted in the common femoral artery.

## 2014-08-07 NOTE — Progress Notes (Signed)
Right lower extremity venous duplex completed.  Right:  No evidence of DVT, superficial thrombosis, or Baker's cyst.  Left:  Negative for DVT in the common femoral vein.  

## 2014-08-08 DIAGNOSIS — F0390 Unspecified dementia without behavioral disturbance: Secondary | ICD-10-CM | POA: Diagnosis not present

## 2014-08-08 DIAGNOSIS — R32 Unspecified urinary incontinence: Secondary | ICD-10-CM | POA: Diagnosis not present

## 2014-08-08 DIAGNOSIS — E871 Hypo-osmolality and hyponatremia: Secondary | ICD-10-CM | POA: Diagnosis not present

## 2014-08-08 DIAGNOSIS — I10 Essential (primary) hypertension: Secondary | ICD-10-CM | POA: Diagnosis not present

## 2014-08-08 DIAGNOSIS — R279 Unspecified lack of coordination: Secondary | ICD-10-CM | POA: Diagnosis not present

## 2014-08-08 DIAGNOSIS — N401 Enlarged prostate with lower urinary tract symptoms: Secondary | ICD-10-CM | POA: Diagnosis not present

## 2014-08-10 ENCOUNTER — Non-Acute Institutional Stay (SKILLED_NURSING_FACILITY): Payer: Medicare Other | Admitting: Internal Medicine

## 2014-08-10 ENCOUNTER — Encounter: Payer: Self-pay | Admitting: Internal Medicine

## 2014-08-10 DIAGNOSIS — D649 Anemia, unspecified: Secondary | ICD-10-CM

## 2014-08-10 DIAGNOSIS — E871 Hypo-osmolality and hyponatremia: Secondary | ICD-10-CM | POA: Diagnosis not present

## 2014-08-10 DIAGNOSIS — R569 Unspecified convulsions: Secondary | ICD-10-CM

## 2014-08-10 DIAGNOSIS — I10 Essential (primary) hypertension: Secondary | ICD-10-CM

## 2014-08-10 DIAGNOSIS — N400 Enlarged prostate without lower urinary tract symptoms: Secondary | ICD-10-CM

## 2014-08-10 DIAGNOSIS — R531 Weakness: Secondary | ICD-10-CM | POA: Insufficient documentation

## 2014-08-10 NOTE — Progress Notes (Signed)
Patient ID: Jesus Ramsey, male   DOB: 07/10/1931, 80 y.o.   MRN: [id number]     Facility: [facility]   PCP: Henrine Screws, MD  Code Status: full code  No Known Allergies  Chief Complaint: new admission  HPI:  80 y/o male patient is here for STR from assisted living [facility]. He was in the hospital from 07/24/14-07/25/14 with unresponsiveness. He was found to have hyponatremia   He was again in the ED on 08/06/14 with a fall and had dvt ruled out for asymmetric swelling He has hx of seizures, MR with cognitive deficits, TIA, HTN, BPH, aspiration pneumonia He is seen in his room today. He is in no distress and denies any complaints. He uses a walker and wheelchair to get around. No falls in the facility He needs assistance with bathing and dressing, is incontinent with bladder. He can communicate his needs verbally  Review of Systems:  Constitutional: Negative for fever, chills, diaphoresis.  HENT: Negative for congestion, hearing loss and sore throat.   Eyes: Negative for eye pain, blurred vision.  Respiratory: Negative for cough, sputum production, shortness of breath and wheezing.   Cardiovascular: Negative for chest pain, palpitations  Gastrointestinal: Negative for heartburn, nausea, vomiting, abdominal pain, diarrhea and constipation.  Genitourinary: Negative for dysuria  Musculoskeletal: Negative for back pain, falls.  Skin: Negative for itching and rash.  Neurological: Negative for dizziness, tingling, focal weakness and headaches.  Psychiatric/Behavioral: Negative for depression     Past Medical History  Diagnosis Date  . Mental retardation   . Hypertension   . Seizures   . Pneumonia   . Psychogenic polydipsia   . B12 deficiency anemia   . Cataract   . Altered mental status   . BPH (benign prostatic hypertrophy)   . Chronic hyponatremia     /notes 01/13/2012   Past Surgical History  Procedure Laterality Date  . No past surgeries      Social History:   reports that he has quit smoking. He does not have any smokeless tobacco history on file. He reports that he does not drink alcohol or use illicit drugs.  No family history on file.  Medications: Patient's Medications  New Prescriptions   No medications on file  Previous Medications   ACETAMINOPHEN (TYLENOL) 325 MG TABLET    Take 650 mg by mouth every 6 (six) hours as needed. pain   ASPIRIN 81 MG CHEWABLE TABLET    Chew 81 mg by mouth daily.   FINASTERIDE (PROSCAR) 5 MG TABLET    Take 5 mg by mouth daily.   LISINOPRIL (PRINIVIL,ZESTRIL) 10 MG TABLET    Take 10 mg by mouth daily.   MAGNESIUM HYDROXIDE (MILK OF MAGNESIA) 400 MG/5ML SUSPENSION    Take 30 mLs by mouth daily as needed. For constipation   SODIUM CHLORIDE 1 G TABLET    Take 1 g by mouth every morning.   TAMSULOSIN HCL (FLOMAX) 0.4 MG CAPS    Take 0.4 mg by mouth daily.   Modified Medications   No medications on file  Discontinued Medications   No medications on file     Physical Exam: Filed Vitals:   08/10/14 1026  BP: 109/67  Pulse: 63  Temp: 97.7 F (36.5 C)  Resp: 20   General- elderly male in no acute distress, frail, thin built Head- atraumatic, normocephalic Eyes- PERRLA, EOMI, no pallor, no icterus, no discharge Neck- no lymphadenopathy Throat- moist mucus membrane, normal oropharynx, poor dentition Cardiovascular-  normal s1,s2, no murmurs Respiratory- bilateral clear to auscultation, no wheeze, no rhonchi, no crackles, no use of accessory muscles Abdomen- bowel sounds present, soft, non tender Musculoskeletal- able to move all 4 extremities, no spinal and paraspinal tenderness, right leg non pitting edema with right > left Neurological- no focal deficit Skin- warm and dry Psychiatry- alert and oriented, normal mood and affect  Labs reviewed: Basic Metabolic Panel:  Recent Labs  07/24/14 0625 07/24/14 0632 07/25/14 0352 08/06/14 2203  NA 129* 126* 131* 129*  K 4.3 4.1 4.1  4.2  CL 91* 91* 97 91*  CO2 28  --  23 28  GLUCOSE 84 85 77 94  BUN 17 17 12 17   CREATININE 0.77 1.00 0.68 0.70  CALCIUM 9.4  --  8.1* 9.1   Liver Function Tests:  Recent Labs  07/24/14 0625  AST 22  ALT 14  ALKPHOS 84  BILITOT 0.4  PROT 6.9  ALBUMIN 3.0*   No results found for this basename: LIPASE, AMYLASE,  in the last 8760 hours No results found for this basename: AMMONIA,  in the last 8760 hours CBC:  Recent Labs  07/24/14 0625 07/24/14 0632 07/25/14 0352 08/06/14 2203  WBC 6.7  --  4.7 13.0*  NEUTROABS 3.7  --   --  10.6*  HGB 12.1* 13.3 10.4* 12.3*  HCT 35.4* 39.0 30.3* 36.5*  MCV 86.8  --  86.8 89.5  PLT 282  --  248 316    Radiological Exams: 08/07/14 doppler ultrasound Summary: - Right lower extremity venous duplex: No evidence of deep vein or   superficial thrombosis involving the right lower extremity and   left common femoral vein. Enlarged inguinal lymph nodes are noted   bilaterally. Right lower extremity arterial duplex: 0-19%   stenosis noted in the common femoral artery bilaterally. 20-49%   stenosis noted in the right femoral, right popliteal, and right   posterior tibial arteries. - No evidence of Baker&'s cyst on the right.  Ct Head Wo Contrast  07/24/2014   CLINICAL DATA:  Altered mental status  EXAM: CT HEAD WITHOUT CONTRAST  TECHNIQUE: Contiguous axial images were obtained from the base of the skull through the vertex without intravenous contrast.  COMPARISON:  January 13, 2012  FINDINGS: Moderate generalized atrophy is stable. There is no appreciable mass, hemorrhage, extra-axial fluid collection, or midline shift. There is mild patchy small vessel disease in the centra semiovale bilaterally, stable. No new gray-white compartment lesion identified. No acute infarct apparent. Bony calvarium appears intact. The mastoid air cells are clear. There is debris in both external auditory canals.  IMPRESSION: Moderate diffuse atrophy with mild  periventricular small vessel disease. No intracranial mass, hemorrhage, or acute appearing infarct. Probable cerumen in both external auditory canals.   Electronically Signed   By: Lowella Grip M.D.   On: 07/24/2014 07:11   Dg Chest Port 1 View  07/24/2014   CLINICAL DATA:  Altered mental status.  EXAM: PORTABLE CHEST - 1 VIEW  COMPARISON:  Chest radiograph from 07/25/2012  FINDINGS: The lungs are well-aerated and clear. There is no evidence of focal opacification, pleural effusion or pneumothorax.  The cardiomediastinal silhouette is within normal limits. No acute osseous abnormalities are seen.  IMPRESSION: No acute cardiopulmonary process seen.   Electronically Signed   By: Garald Balding M.D.   On: 07/24/2014 06:49   Assessment/Plan  Generalized weakness Will have him work with physical therapy and occupational therapy team to help with gait training and  muscle strengthening exercises.fall precautions. Skin care. Encourage to be out of bed.   Hyponatremia Chronic, continue sodium tablet and monitor bmp  Anemia Monitor h&h, check cbc and ferritin level  BPH Continue proscar and flomax  HTN bp on lower side of normal. Recent fall could have been iatrogenic. Will decrease lisinopril to 5 mg daily and have holding paramters for metoprolol. Check bp twice daily for now and adjust meds if needed  Seizure Has been seizure free. Monitor clinically. Not on any meds   Family/ staff Communication: reviewed care plan with patient and nursing supervisor  Goals of care: short term rehabilitation   Labs/tests ordered: cbc with diff, cmp, prealbumin, ferritin level   Blanchie Serve, MD  [facility] [phone] (Monday-Friday 8 am - 5 pm) [phone] (afterhours)

## 2014-08-13 DIAGNOSIS — R1310 Dysphagia, unspecified: Secondary | ICD-10-CM | POA: Diagnosis not present

## 2014-08-13 DIAGNOSIS — R41841 Cognitive communication deficit: Secondary | ICD-10-CM | POA: Diagnosis not present

## 2014-08-13 DIAGNOSIS — R4182 Altered mental status, unspecified: Secondary | ICD-10-CM | POA: Diagnosis not present

## 2014-08-13 DIAGNOSIS — G459 Transient cerebral ischemic attack, unspecified: Secondary | ICD-10-CM | POA: Diagnosis not present

## 2014-08-13 DIAGNOSIS — J180 Bronchopneumonia, unspecified organism: Secondary | ICD-10-CM | POA: Diagnosis not present

## 2014-08-13 DIAGNOSIS — D291 Benign neoplasm of prostate: Secondary | ICD-10-CM | POA: Diagnosis not present

## 2014-08-13 DIAGNOSIS — J189 Pneumonia, unspecified organism: Secondary | ICD-10-CM | POA: Diagnosis not present

## 2014-08-13 DIAGNOSIS — Z5189 Encounter for other specified aftercare: Secondary | ICD-10-CM | POA: Diagnosis not present

## 2014-08-13 DIAGNOSIS — I10 Essential (primary) hypertension: Secondary | ICD-10-CM | POA: Diagnosis not present

## 2014-08-13 LAB — CBC AND DIFFERENTIAL
HEMATOCRIT: 37 % — AB (ref 41–53)
Hemoglobin: 12.4 g/dL — AB (ref 13.5–17.5)
NEUTROS ABS: 3 /uL
Platelets: 332 10*3/uL (ref 150–399)
WBC: 5.7 10^3/mL

## 2014-08-13 LAB — BASIC METABOLIC PANEL
BUN: 14 mg/dL (ref 4–21)
Creatinine: 0.8 mg/dL (ref 0.6–1.3)
GLUCOSE: 80 mg/dL
POTASSIUM: 4.6 mmol/L (ref 3.4–5.3)
SODIUM: 133 mmol/L — AB (ref 137–147)

## 2014-08-14 DIAGNOSIS — R4182 Altered mental status, unspecified: Secondary | ICD-10-CM | POA: Diagnosis not present

## 2014-08-14 DIAGNOSIS — D291 Benign neoplasm of prostate: Secondary | ICD-10-CM | POA: Diagnosis not present

## 2014-08-14 DIAGNOSIS — J180 Bronchopneumonia, unspecified organism: Secondary | ICD-10-CM | POA: Diagnosis not present

## 2014-08-14 DIAGNOSIS — R41841 Cognitive communication deficit: Secondary | ICD-10-CM | POA: Diagnosis not present

## 2014-08-14 DIAGNOSIS — I10 Essential (primary) hypertension: Secondary | ICD-10-CM | POA: Diagnosis not present

## 2014-08-14 DIAGNOSIS — G459 Transient cerebral ischemic attack, unspecified: Secondary | ICD-10-CM | POA: Diagnosis not present

## 2014-08-15 DIAGNOSIS — J180 Bronchopneumonia, unspecified organism: Secondary | ICD-10-CM | POA: Diagnosis not present

## 2014-08-15 DIAGNOSIS — I10 Essential (primary) hypertension: Secondary | ICD-10-CM | POA: Diagnosis not present

## 2014-08-15 DIAGNOSIS — D291 Benign neoplasm of prostate: Secondary | ICD-10-CM | POA: Diagnosis not present

## 2014-08-15 DIAGNOSIS — R4182 Altered mental status, unspecified: Secondary | ICD-10-CM | POA: Diagnosis not present

## 2014-08-15 DIAGNOSIS — R41841 Cognitive communication deficit: Secondary | ICD-10-CM | POA: Diagnosis not present

## 2014-08-15 DIAGNOSIS — G459 Transient cerebral ischemic attack, unspecified: Secondary | ICD-10-CM | POA: Diagnosis not present

## 2014-08-16 DIAGNOSIS — R41841 Cognitive communication deficit: Secondary | ICD-10-CM | POA: Diagnosis not present

## 2014-08-16 DIAGNOSIS — D291 Benign neoplasm of prostate: Secondary | ICD-10-CM | POA: Diagnosis not present

## 2014-08-16 DIAGNOSIS — G459 Transient cerebral ischemic attack, unspecified: Secondary | ICD-10-CM | POA: Diagnosis not present

## 2014-08-16 DIAGNOSIS — I10 Essential (primary) hypertension: Secondary | ICD-10-CM | POA: Diagnosis not present

## 2014-08-16 DIAGNOSIS — J180 Bronchopneumonia, unspecified organism: Secondary | ICD-10-CM | POA: Diagnosis not present

## 2014-08-16 DIAGNOSIS — R4182 Altered mental status, unspecified: Secondary | ICD-10-CM | POA: Diagnosis not present

## 2014-08-17 DIAGNOSIS — J180 Bronchopneumonia, unspecified organism: Secondary | ICD-10-CM | POA: Diagnosis not present

## 2014-08-17 DIAGNOSIS — R41841 Cognitive communication deficit: Secondary | ICD-10-CM | POA: Diagnosis not present

## 2014-08-17 DIAGNOSIS — D291 Benign neoplasm of prostate: Secondary | ICD-10-CM | POA: Diagnosis not present

## 2014-08-17 DIAGNOSIS — G459 Transient cerebral ischemic attack, unspecified: Secondary | ICD-10-CM | POA: Diagnosis not present

## 2014-08-17 DIAGNOSIS — R4182 Altered mental status, unspecified: Secondary | ICD-10-CM | POA: Diagnosis not present

## 2014-08-17 DIAGNOSIS — I10 Essential (primary) hypertension: Secondary | ICD-10-CM | POA: Diagnosis not present

## 2014-08-20 DIAGNOSIS — D291 Benign neoplasm of prostate: Secondary | ICD-10-CM | POA: Diagnosis not present

## 2014-08-20 DIAGNOSIS — R41841 Cognitive communication deficit: Secondary | ICD-10-CM | POA: Diagnosis not present

## 2014-08-20 DIAGNOSIS — J180 Bronchopneumonia, unspecified organism: Secondary | ICD-10-CM | POA: Diagnosis not present

## 2014-08-20 DIAGNOSIS — R4182 Altered mental status, unspecified: Secondary | ICD-10-CM | POA: Diagnosis not present

## 2014-08-20 DIAGNOSIS — G459 Transient cerebral ischemic attack, unspecified: Secondary | ICD-10-CM | POA: Diagnosis not present

## 2014-08-20 DIAGNOSIS — I10 Essential (primary) hypertension: Secondary | ICD-10-CM | POA: Diagnosis not present

## 2014-08-21 DIAGNOSIS — J180 Bronchopneumonia, unspecified organism: Secondary | ICD-10-CM | POA: Diagnosis not present

## 2014-08-21 DIAGNOSIS — G459 Transient cerebral ischemic attack, unspecified: Secondary | ICD-10-CM | POA: Diagnosis not present

## 2014-08-21 DIAGNOSIS — I10 Essential (primary) hypertension: Secondary | ICD-10-CM | POA: Diagnosis not present

## 2014-08-21 DIAGNOSIS — D291 Benign neoplasm of prostate: Secondary | ICD-10-CM | POA: Diagnosis not present

## 2014-08-21 DIAGNOSIS — R41841 Cognitive communication deficit: Secondary | ICD-10-CM | POA: Diagnosis not present

## 2014-08-21 DIAGNOSIS — R4182 Altered mental status, unspecified: Secondary | ICD-10-CM | POA: Diagnosis not present

## 2014-08-22 DIAGNOSIS — R4182 Altered mental status, unspecified: Secondary | ICD-10-CM | POA: Diagnosis not present

## 2014-08-22 DIAGNOSIS — R41841 Cognitive communication deficit: Secondary | ICD-10-CM | POA: Diagnosis not present

## 2014-08-22 DIAGNOSIS — Z23 Encounter for immunization: Secondary | ICD-10-CM | POA: Diagnosis not present

## 2014-08-22 DIAGNOSIS — D291 Benign neoplasm of prostate: Secondary | ICD-10-CM | POA: Diagnosis not present

## 2014-08-22 DIAGNOSIS — G459 Transient cerebral ischemic attack, unspecified: Secondary | ICD-10-CM | POA: Diagnosis not present

## 2014-08-22 DIAGNOSIS — I10 Essential (primary) hypertension: Secondary | ICD-10-CM | POA: Diagnosis not present

## 2014-08-22 DIAGNOSIS — J180 Bronchopneumonia, unspecified organism: Secondary | ICD-10-CM | POA: Diagnosis not present

## 2014-08-30 ENCOUNTER — Ambulatory Visit: Payer: Medicare Other | Admitting: Podiatrist

## 2014-08-30 ENCOUNTER — Ambulatory Visit (INDEPENDENT_AMBULATORY_CARE_PROVIDER_SITE_OTHER): Payer: Medicare Other | Admitting: Podiatrist

## 2014-08-30 DIAGNOSIS — B351 Tinea unguium: Secondary | ICD-10-CM | POA: Diagnosis not present

## 2014-08-30 DIAGNOSIS — M79676 Pain in unspecified toe(s): Secondary | ICD-10-CM | POA: Diagnosis not present

## 2014-08-31 NOTE — Progress Notes (Signed)
HPI: Patient presents today for follow up of foot and nail care. Denies any new complaints today.  Objective: Patients chart is reviewed. Neurovascular status unchanged with decreased pedal pulses noted. . Patients nails are thickened, discolored, distrophic, friable and brittle with yellow-brown discoloration. Patient subjectively relates they are painful with shoes and with ambulation of bilateral feet. He has very large hard calluses on the dorsal aspect of the right 5th toe. He relates they are not bothering him today.  Assessment: Symptomatic onychomycosis  Plan: Discussed treatment options and alternatives. The symptomatic toenails were debrided through manual an mechanical means without complication. Recommended debridement of the corns but he refused.  Return appointment recommended at routine intervals of 3 months

## 2014-09-14 DIAGNOSIS — R41841 Cognitive communication deficit: Secondary | ICD-10-CM | POA: Diagnosis not present

## 2014-09-14 DIAGNOSIS — R4182 Altered mental status, unspecified: Secondary | ICD-10-CM | POA: Diagnosis not present

## 2014-09-14 DIAGNOSIS — J189 Pneumonia, unspecified organism: Secondary | ICD-10-CM | POA: Diagnosis not present

## 2014-09-14 DIAGNOSIS — R1312 Dysphagia, oropharyngeal phase: Secondary | ICD-10-CM | POA: Diagnosis not present

## 2014-09-14 DIAGNOSIS — D291 Benign neoplasm of prostate: Secondary | ICD-10-CM | POA: Diagnosis not present

## 2014-09-14 DIAGNOSIS — Z5189 Encounter for other specified aftercare: Secondary | ICD-10-CM | POA: Diagnosis not present

## 2014-09-14 DIAGNOSIS — I10 Essential (primary) hypertension: Secondary | ICD-10-CM | POA: Diagnosis not present

## 2014-09-14 DIAGNOSIS — J180 Bronchopneumonia, unspecified organism: Secondary | ICD-10-CM | POA: Diagnosis not present

## 2014-09-14 DIAGNOSIS — G459 Transient cerebral ischemic attack, unspecified: Secondary | ICD-10-CM | POA: Diagnosis not present

## 2014-09-17 DIAGNOSIS — R41841 Cognitive communication deficit: Secondary | ICD-10-CM | POA: Diagnosis not present

## 2014-09-17 DIAGNOSIS — R4182 Altered mental status, unspecified: Secondary | ICD-10-CM | POA: Diagnosis not present

## 2014-09-17 DIAGNOSIS — J180 Bronchopneumonia, unspecified organism: Secondary | ICD-10-CM | POA: Diagnosis not present

## 2014-09-17 DIAGNOSIS — R0989 Other specified symptoms and signs involving the circulatory and respiratory systems: Secondary | ICD-10-CM | POA: Diagnosis not present

## 2014-09-17 DIAGNOSIS — G459 Transient cerebral ischemic attack, unspecified: Secondary | ICD-10-CM | POA: Diagnosis not present

## 2014-09-17 DIAGNOSIS — D291 Benign neoplasm of prostate: Secondary | ICD-10-CM | POA: Diagnosis not present

## 2014-09-17 DIAGNOSIS — I10 Essential (primary) hypertension: Secondary | ICD-10-CM | POA: Diagnosis not present

## 2014-09-17 DIAGNOSIS — R05 Cough: Secondary | ICD-10-CM | POA: Diagnosis not present

## 2014-09-18 DIAGNOSIS — R4182 Altered mental status, unspecified: Secondary | ICD-10-CM | POA: Diagnosis not present

## 2014-09-18 DIAGNOSIS — I10 Essential (primary) hypertension: Secondary | ICD-10-CM | POA: Diagnosis not present

## 2014-09-18 DIAGNOSIS — G459 Transient cerebral ischemic attack, unspecified: Secondary | ICD-10-CM | POA: Diagnosis not present

## 2014-09-18 DIAGNOSIS — J180 Bronchopneumonia, unspecified organism: Secondary | ICD-10-CM | POA: Diagnosis not present

## 2014-09-18 DIAGNOSIS — R41841 Cognitive communication deficit: Secondary | ICD-10-CM | POA: Diagnosis not present

## 2014-09-18 DIAGNOSIS — D291 Benign neoplasm of prostate: Secondary | ICD-10-CM | POA: Diagnosis not present

## 2014-09-19 DIAGNOSIS — D291 Benign neoplasm of prostate: Secondary | ICD-10-CM | POA: Diagnosis not present

## 2014-09-19 DIAGNOSIS — J180 Bronchopneumonia, unspecified organism: Secondary | ICD-10-CM | POA: Diagnosis not present

## 2014-09-19 DIAGNOSIS — R41841 Cognitive communication deficit: Secondary | ICD-10-CM | POA: Diagnosis not present

## 2014-09-19 DIAGNOSIS — I10 Essential (primary) hypertension: Secondary | ICD-10-CM | POA: Diagnosis not present

## 2014-09-19 DIAGNOSIS — G459 Transient cerebral ischemic attack, unspecified: Secondary | ICD-10-CM | POA: Diagnosis not present

## 2014-09-19 DIAGNOSIS — R4182 Altered mental status, unspecified: Secondary | ICD-10-CM | POA: Diagnosis not present

## 2014-09-20 DIAGNOSIS — J180 Bronchopneumonia, unspecified organism: Secondary | ICD-10-CM | POA: Diagnosis not present

## 2014-09-20 DIAGNOSIS — G459 Transient cerebral ischemic attack, unspecified: Secondary | ICD-10-CM | POA: Diagnosis not present

## 2014-09-20 DIAGNOSIS — D291 Benign neoplasm of prostate: Secondary | ICD-10-CM | POA: Diagnosis not present

## 2014-09-20 DIAGNOSIS — R41841 Cognitive communication deficit: Secondary | ICD-10-CM | POA: Diagnosis not present

## 2014-09-20 DIAGNOSIS — I10 Essential (primary) hypertension: Secondary | ICD-10-CM | POA: Diagnosis not present

## 2014-09-20 DIAGNOSIS — R4182 Altered mental status, unspecified: Secondary | ICD-10-CM | POA: Diagnosis not present

## 2014-09-21 DIAGNOSIS — R4182 Altered mental status, unspecified: Secondary | ICD-10-CM | POA: Diagnosis not present

## 2014-09-21 DIAGNOSIS — D291 Benign neoplasm of prostate: Secondary | ICD-10-CM | POA: Diagnosis not present

## 2014-09-21 DIAGNOSIS — R41841 Cognitive communication deficit: Secondary | ICD-10-CM | POA: Diagnosis not present

## 2014-09-21 DIAGNOSIS — G459 Transient cerebral ischemic attack, unspecified: Secondary | ICD-10-CM | POA: Diagnosis not present

## 2014-09-21 DIAGNOSIS — J180 Bronchopneumonia, unspecified organism: Secondary | ICD-10-CM | POA: Diagnosis not present

## 2014-09-21 DIAGNOSIS — I10 Essential (primary) hypertension: Secondary | ICD-10-CM | POA: Diagnosis not present

## 2014-10-02 ENCOUNTER — Encounter: Payer: Self-pay | Admitting: Internal Medicine

## 2014-10-02 ENCOUNTER — Non-Acute Institutional Stay (SKILLED_NURSING_FACILITY): Payer: Medicare Other | Admitting: Internal Medicine

## 2014-10-02 DIAGNOSIS — R531 Weakness: Secondary | ICD-10-CM | POA: Diagnosis not present

## 2014-10-02 DIAGNOSIS — E871 Hypo-osmolality and hyponatremia: Secondary | ICD-10-CM | POA: Diagnosis not present

## 2014-10-02 DIAGNOSIS — I10 Essential (primary) hypertension: Secondary | ICD-10-CM

## 2014-10-02 DIAGNOSIS — I952 Hypotension due to drugs: Secondary | ICD-10-CM | POA: Diagnosis not present

## 2014-10-02 DIAGNOSIS — N400 Enlarged prostate without lower urinary tract symptoms: Secondary | ICD-10-CM

## 2014-10-02 NOTE — Progress Notes (Signed)
Patient ID: Jesus Ramsey, male   DOB: 07/10/1931, 80 y.o.   MRN: [id number]  Location:  [facility] Provider:  Rexene Edison. Mariea Clonts, D.O., C.M.D.  Code Status: Full code  Chief Complaint  Patient presents with  . Acute Visit    hypotension especially in mornings  . Medical Management of Chronic Issues    HPI:  80 yo male here for long term care was seen for acute visit due to hypotension with morning bp checks especially, but low overall (80s-110s/50s-70s, only 1-2 in the 845X systolic) on review and slight tachycardia (HR 90s to lower 100s).  He is on two prostate meds with finasteride and tamsulosin and also lisinopril and metoprolol.    He was also seen for review of his chronic medical conditions.  Labs from October reviewed.    He had no complaints whatsoever.  Denies dizziness or lightheadedness, falls.  He says he feels the same everyday.    Review of Systems:  Review of Systems  Constitutional: Negative for fever.  Respiratory: Negative for shortness of breath.   Cardiovascular: Negative for chest pain and leg swelling.  Gastrointestinal: Negative for abdominal pain.  Genitourinary: Negative for dysuria.  Musculoskeletal: Negative for falls.  Neurological: Negative for dizziness.    Medications: Patient's Medications  New Prescriptions   No medications on file  Previous Medications   ACETAMINOPHEN (TYLENOL) 325 MG TABLET    Take 650 mg by mouth every 6 (six) hours as needed. pain   ASPIRIN 81 MG CHEWABLE TABLET    Chew 81 mg by mouth daily.   FINASTERIDE (PROSCAR) 5 MG TABLET    Take 5 mg by mouth daily.   LISINOPRIL (PRINIVIL,ZESTRIL) 10 MG TABLET    Take 10 mg by mouth daily.   MAGNESIUM HYDROXIDE (MILK OF MAGNESIA) 400 MG/5ML SUSPENSION    Take 30 mLs by mouth daily as needed. For constipation   METOPROLOL (LOPRESSOR) 50 MG TABLET    Take 75 mg by mouth 2 (two) times daily.   SODIUM CHLORIDE 1 G TABLET    Take 1 g by mouth every morning.   TAMSULOSIN  HCL (FLOMAX) 0.4 MG CAPS    Take 0.4 mg by mouth daily.   Modified Medications   No medications on file  Discontinued Medications   No medications on file    Physical Exam: Filed Vitals:   10/02/14 0957  BP: 100/60  Pulse: 101  Temp: 97.1 F (36.2 C)  Resp: 20  Height: 5\' 8"  (1.727 m)  Weight: 138 lb (62.596 kg)  Physical Exam  Constitutional: No distress.  Thin black male  Cardiovascular: Normal rate and regular rhythm.   Pulmonary/Chest: Effort normal and breath sounds normal. No respiratory distress.  Abdominal: Soft. Bowel sounds are normal. He exhibits no distension.  Musculoskeletal: Normal range of motion.  Neurological: He is alert.  Skin: Skin is warm and dry.    Labs reviewed: Basic Metabolic Panel:  Recent Labs  07/24/14 0625 07/24/14 0632 07/25/14 0352 08/06/14 2203 08/13/14  NA 129* 126* 131* 129* 133*  K 4.3 4.1 4.1 4.2 4.6  CL 91* 91* 97 91*  --   CO2 28  --  23 28  --   GLUCOSE 84 85 77 94  --   BUN 17 17 12 17 14   CREATININE 0.77 1.00 0.68 0.70 0.8  CALCIUM 9.4  --  8.1* 9.1  --     Liver Function Tests:  Recent Labs  07/24/14 0625  AST 22  ALT 14  ALKPHOS 84  BILITOT 0.4  PROT 6.9  ALBUMIN 3.0*    CBC:  Recent Labs  07/24/14 0625  07/25/14 0352 08/06/14 2203 08/13/14  WBC 6.7  --  4.7 13.0* 5.7  NEUTROABS 3.7  --   --  10.6* 3  HGB 12.1*  < > 10.4* 12.3* 12.4*  HCT 35.4*  < > 30.3* 36.5* 37*  MCV 86.8  --  86.8 89.5  --   PLT 282  --  248 316 332  < > = values in this interval not displayed. 08/13/14 cbc, bmp abstracted Prealbumin 13.1, ferritin 360  Assessment/Plan 1. Hypotension due to drugs - d/c lisinopril -cont metoprolol due to relatively high pulse -Cont to monitor  2. Generalized weakness - here for rehab, previously was at [facility]  3. BPH (benign prostatic hyperplasia) -continues on flomax and finasteride which also can lower his blood pressure  4. Essential hypertension -has been overly well  controlled so stop lisinopril (not diabetic)  5. Hyponatremia -on sodium tablets for this so will f/u bmp  Family/ staff Communication: seen with unit supervisor  Goals of care: full code, long term care  Labs/tests ordered:  F/u bmp due to hyponatremia on sodium tablets

## 2014-10-05 DIAGNOSIS — G459 Transient cerebral ischemic attack, unspecified: Secondary | ICD-10-CM | POA: Diagnosis not present

## 2014-11-23 ENCOUNTER — Non-Acute Institutional Stay (SKILLED_NURSING_FACILITY): Payer: Medicare Other | Admitting: Adult Health

## 2014-11-23 ENCOUNTER — Encounter: Payer: Self-pay | Admitting: Adult Health

## 2014-11-23 DIAGNOSIS — N400 Enlarged prostate without lower urinary tract symptoms: Secondary | ICD-10-CM | POA: Diagnosis not present

## 2014-11-23 DIAGNOSIS — R569 Unspecified convulsions: Secondary | ICD-10-CM | POA: Diagnosis not present

## 2014-11-23 DIAGNOSIS — D649 Anemia, unspecified: Secondary | ICD-10-CM

## 2014-11-23 DIAGNOSIS — I10 Essential (primary) hypertension: Secondary | ICD-10-CM

## 2014-11-23 DIAGNOSIS — E871 Hypo-osmolality and hyponatremia: Secondary | ICD-10-CM

## 2014-11-23 NOTE — Progress Notes (Signed)
Patient ID: Jesus Ramsey, male   DOB: [birth year], 81 y.o.   MRN: [id number]  [name] living [place]     No Known Allergies  CODE STATUS: FULL CODE    Chief Complaint  Patient presents with  . Medical Management of Chronic Issues    HPI:  He is a long term resident of this facility being seen for the management of his chronic illnesses. Overall his status remains without significant change. He cannot fully participate in the hpi or ros. He is not voicing any complaints today states that he is feeling good and is wanting to "get out of here". There are no nursing concerns at this time.     Past Medical History  Diagnosis Date  . Mental retardation   . Hypertension   . Seizures   . Pneumonia   . Psychogenic polydipsia   . B12 deficiency anemia   . Cataract   . Altered mental status   . BPH (benign prostatic hypertrophy)   . Chronic hyponatremia     /notes 01/13/2012    Past Surgical History  Procedure Laterality Date  . No past surgeries      Filed Vitals:   11/23/14 1234  BP: 118/72  Pulse: 68  Height: 5\' 8"  (1.727 m)  Weight: 158 lb (71.668 kg)  SpO2: 97%     Outpatient Encounter Prescriptions as of 11/23/2014  Medication Sig  . acetaminophen (TYLENOL) 325 MG tablet Take 650 mg by mouth every 6 (six) hours as needed. pain  . aspirin 81 MG chewable tablet Chew 81 mg by mouth daily.  . magnesium hydroxide (MILK OF MAGNESIA) 400 MG/5ML suspension Take 30 mLs by mouth daily as needed. For constipation  . metoprolol (LOPRESSOR) 50 MG tablet Take 75 mg by mouth 2 (two) times daily.  . sodium chloride 1 G tablet Take 1 g by mouth every morning.   Thick it Nectar thick liquids   . Tamsulosin HCl (FLOMAX) 0.4 MG CAPS Take 0.4 mg by mouth daily.   . [DISCONTINUED] finasteride (PROSCAR) 5 MG tablet Take 5 mg by mouth daily.  . [DISCONTINUED] lisinopril (PRINIVIL,ZESTRIL) 10 MG tablet Take 10 mg by mouth daily.     SIGNIFICANT DIAGNOSTIC EXAMS  09-17-14: chest x-ray:  no active disease seen in chest   LABS REVIEWED;   08-13-14: wbc 5.7; hgb 12.4; hct 37.4; mcv 88 plt 332; glucose 80; bun 14; creat 0.78; k+4.6; na++133 ferritin 360; pre-albumin 13.1 10-05-14: glucose 75; bun 13; creat 0.69; k+4.0; na++ 140     Review of Systems  Unable to perform ROS    Physical Exam  Constitutional: No distress.  Thin   Neck: Neck supple. No JVD present. No thyromegaly present.  Cardiovascular: Normal rate, regular rhythm and intact distal pulses.   Respiratory: Effort normal and breath sounds normal. No respiratory distress.  GI: Soft. He exhibits no distension. There is no tenderness.  Musculoskeletal:  Is able to move all extremities Has 3+ lower extremity edema  Neurological: He is alert.  Skin: Skin is warm and dry. He is not diaphoretic.       ASSESSMENT/ PLAN:  1.  Hypertension: is stable will continue lopressor 50 mg twice daily and will monitor  2.  Dysphagia: no signs of aspiration present; will continue nectar thick liquids and will monitor his status.   3. Hyponatremia is on nacl 1 gm daily with a na++ of 140; will not make changes will monitor  4. BPH: will continue flomax daily  5.  Seizure: no reports of seizure activity present. Is presently not on medications; will continue to monitor his status.   6. Anemia: is stable hgb is 12.4 will monitor     [name] NP [facility]  Contact [phone] Monday through Friday 8am- 5pm  After hours call [phone]

## 2014-12-04 DIAGNOSIS — H2513 Age-related nuclear cataract, bilateral: Secondary | ICD-10-CM | POA: Diagnosis not present

## 2014-12-04 DIAGNOSIS — H40013 Open angle with borderline findings, low risk, bilateral: Secondary | ICD-10-CM | POA: Diagnosis not present

## 2015-01-01 ENCOUNTER — Non-Acute Institutional Stay (SKILLED_NURSING_FACILITY): Payer: Medicare Other | Admitting: Adult Health

## 2015-01-01 DIAGNOSIS — N400 Enlarged prostate without lower urinary tract symptoms: Secondary | ICD-10-CM

## 2015-01-01 DIAGNOSIS — E871 Hypo-osmolality and hyponatremia: Secondary | ICD-10-CM | POA: Diagnosis not present

## 2015-01-01 DIAGNOSIS — R569 Unspecified convulsions: Secondary | ICD-10-CM | POA: Diagnosis not present

## 2015-01-01 DIAGNOSIS — D649 Anemia, unspecified: Secondary | ICD-10-CM | POA: Diagnosis not present

## 2015-01-01 DIAGNOSIS — I10 Essential (primary) hypertension: Secondary | ICD-10-CM | POA: Diagnosis not present

## 2015-01-17 ENCOUNTER — Encounter: Payer: Self-pay | Admitting: Adult Health

## 2015-01-17 NOTE — Progress Notes (Signed)
Patient ID: Jesus Ramsey, male   DOB: 03/10/1931, 81 y.o.   MRN: [id number]  [name] living [place]     No Known Allergies     Chief Complaint  Patient presents with  . Medical Management of Chronic Issues    HPI:  He is a long term resident of this facility being seen for the management of his chronic illnesses. He is unable to fully participate in the hpi or ros. He remains without significant change at this time. There are no nursing concerns at this time.    Past Medical History  Diagnosis Date  . Mental retardation   . Hypertension   . Seizures   . Pneumonia   . Psychogenic polydipsia   . B12 deficiency anemia   . Cataract   . Altered mental status   . BPH (benign prostatic hypertrophy)   . Chronic hyponatremia     [name] 01/13/2012  . B12 deficiency 01/14/2012    Past Surgical History  Procedure Laterality Date  . No past surgeries      VITAL SIGNS BP 129/74 mmHg  Pulse 81  Ht 5\' 8"  (1.727 m)  Wt 154 lb (69.854 kg)  BMI 23.42 kg/m2   Outpatient Encounter Prescriptions as of 01/01/2015  Medication Sig  . acetaminophen (TYLENOL) 325 MG tablet Take 650 mg by mouth every 6 (six) hours as needed. pain  . aspirin 81 MG chewable tablet Chew 81 mg by mouth daily.  . food thickener (THICK IT) POWD Take 1 Container by mouth as needed. For nectar thick liquids  . magnesium hydroxide (MILK OF MAGNESIA) 400 MG/5ML suspension Take 30 mLs by mouth daily as needed. For constipation  . metoprolol (LOPRESSOR) 50 MG tablet Take 75 mg by mouth 2 (two) times daily.  . sodium chloride 1 G tablet Take 1 g by mouth every morning.  . Tamsulosin HCl (FLOMAX) 0.4 MG CAPS Take 0.4 mg by mouth daily.      SIGNIFICANT DIAGNOSTIC EXAMS  09-17-14: chest x-ray: no active disease seen in chest   LABS REVIEWED;   08-13-14: wbc 5.7; hgb 12.4; hct 37.4; mcv 88 plt 332; glucose 80; bun 14; creat 0.78; k+4.6; na++133 ferritin 360; pre-albumin 13.1 10-05-14: glucose 75; bun 13; creat  0.69; k+4.0; na++ 140      ROS Unable to perform ROS   Physical Exam Constitutional: No distress.  Thin   Neck: Neck supple. No JVD present. No thyromegaly present.  Cardiovascular: Normal rate, regular rhythm and intact distal pulses.   Respiratory: Effort normal and breath sounds normal. No respiratory distress.  GI: Soft. He exhibits no distension. There is no tenderness.  Musculoskeletal:  Is able to move all extremities Has 2+ lower extremity edema  Neurological: He is alert.  Skin: Skin is warm and dry. He is not diaphoretic.     ASSESSMENT/ PLAN:  1.  Hypertension: is stable will continue lopressor 50 mg twice daily and will monitor  2.  Dysphagia: no signs of aspiration present; will continue nectar thick liquids and will monitor his status.   3. Hyponatremia is on nacl 1 gm daily with a na++ of 140; will not make changes will monitor  4. BPH: will continue flomax daily  5. Seizure: no reports of seizure activity present. Is presently not on medications; will continue to monitor his status.   6. Anemia: is stable hgb is 12.4 will monitor      [name] NP [facility]  Contact [phone] Monday through Friday 8am-  5pm  After hours call [phone]

## 2015-02-04 ENCOUNTER — Non-Acute Institutional Stay (SKILLED_NURSING_FACILITY): Payer: Medicare Other | Admitting: Adult Health

## 2015-02-04 DIAGNOSIS — R1314 Dysphagia, pharyngoesophageal phase: Secondary | ICD-10-CM | POA: Diagnosis not present

## 2015-02-04 DIAGNOSIS — I10 Essential (primary) hypertension: Secondary | ICD-10-CM | POA: Diagnosis not present

## 2015-02-04 DIAGNOSIS — N400 Enlarged prostate without lower urinary tract symptoms: Secondary | ICD-10-CM | POA: Diagnosis not present

## 2015-02-04 DIAGNOSIS — E871 Hypo-osmolality and hyponatremia: Secondary | ICD-10-CM

## 2015-02-04 DIAGNOSIS — D649 Anemia, unspecified: Secondary | ICD-10-CM | POA: Diagnosis not present

## 2015-02-04 DIAGNOSIS — R569 Unspecified convulsions: Secondary | ICD-10-CM | POA: Diagnosis not present

## 2015-03-11 ENCOUNTER — Non-Acute Institutional Stay (SKILLED_NURSING_FACILITY): Payer: Medicare Other | Admitting: Adult Health

## 2015-03-11 DIAGNOSIS — E871 Hypo-osmolality and hyponatremia: Secondary | ICD-10-CM | POA: Diagnosis not present

## 2015-03-11 DIAGNOSIS — N400 Enlarged prostate without lower urinary tract symptoms: Secondary | ICD-10-CM

## 2015-03-11 DIAGNOSIS — D649 Anemia, unspecified: Secondary | ICD-10-CM

## 2015-03-11 DIAGNOSIS — R1314 Dysphagia, pharyngoesophageal phase: Secondary | ICD-10-CM

## 2015-03-11 DIAGNOSIS — I10 Essential (primary) hypertension: Secondary | ICD-10-CM

## 2015-03-11 DIAGNOSIS — R569 Unspecified convulsions: Secondary | ICD-10-CM

## 2015-03-13 DIAGNOSIS — E785 Hyperlipidemia, unspecified: Secondary | ICD-10-CM | POA: Diagnosis not present

## 2015-03-13 DIAGNOSIS — D649 Anemia, unspecified: Secondary | ICD-10-CM | POA: Diagnosis not present

## 2015-04-02 DIAGNOSIS — I70203 Unspecified atherosclerosis of native arteries of extremities, bilateral legs: Secondary | ICD-10-CM | POA: Diagnosis not present

## 2015-04-02 DIAGNOSIS — L84 Corns and callosities: Secondary | ICD-10-CM | POA: Diagnosis not present

## 2015-04-02 DIAGNOSIS — M79675 Pain in left toe(s): Secondary | ICD-10-CM | POA: Diagnosis not present

## 2015-04-02 DIAGNOSIS — B351 Tinea unguium: Secondary | ICD-10-CM | POA: Diagnosis not present

## 2015-04-08 ENCOUNTER — Encounter: Payer: Self-pay | Admitting: Adult Health

## 2015-04-08 DIAGNOSIS — R1310 Dysphagia, unspecified: Secondary | ICD-10-CM | POA: Insufficient documentation

## 2015-04-08 NOTE — Progress Notes (Signed)
Patient ID: Jesus Ramsey, male   DOB: [birth year], 81 y.o.   MRN: [id number]  Jesus Ramsey living [place]     No Known Allergies     Chief Complaint  Patient presents with  . Medical Management of Chronic Issues    HPI:  He is a long term resident of this facility being seen for the management of his chronic illnesses. Overall his status remains stable. He is unable to fully participate in the hpi or ros; but states that he is feeling good. There are no nursing concerns at at this time.    Past Medical History  Diagnosis Date  . Mental retardation   . Hypertension   . Seizures   . Pneumonia   . Psychogenic polydipsia   . B12 deficiency anemia   . Cataract   . Altered mental status   . BPH (benign prostatic hypertrophy)   . Chronic hyponatremia     Jesus Ramsey 01/13/2012  . B12 deficiency 01/14/2012    Past Surgical History  Procedure Laterality Date  . No past surgeries      VITAL SIGNS BP 101/68 mmHg  Pulse 88  Ht 5\' 8"  (1.727 m)  Wt 156 lb (70.761 kg)  BMI 23.73 kg/m2   Outpatient Encounter Prescriptions as of 02/04/2015  Medication Sig  . acetaminophen (TYLENOL) 325 MG tablet Take 650 mg by mouth every 6 (six) hours as needed. pain  . aspirin 81 MG chewable tablet Chew 81 mg by mouth daily.  . food thickener (THICK IT) POWD Take 1 Container by mouth as needed. For nectar thick liquids  . magnesium hydroxide (MILK OF MAGNESIA) 400 MG/5ML suspension Take 30 mLs by mouth daily as needed. For constipation  . metoprolol (LOPRESSOR) 50 MG tablet Take 75 mg by mouth 2 (two) times daily.  . sodium chloride 1 G tablet Take 1 g by mouth every morning.  . Tamsulosin HCl (FLOMAX) 0.4 MG CAPS Take 0.4 mg by mouth daily.       SIGNIFICANT DIAGNOSTIC EXAMS  09-17-14: chest x-ray: no active disease seen in chest   LABS REVIEWED;   08-13-14: wbc 5.7; hgb 12.4; hct 37.4; mcv 88 plt 332; glucose 80; bun 14; creat 0.78; k+4.6; na++133 ferritin 360; pre-albumin 13.1 10-05-14:  glucose 75; bun 13; creat 0.69; k+4.0; na++ 140      Review of Systems  Unable to perform ROS    Physical Exam Constitutional: No distress.  Thin   Neck: Neck supple. No JVD present. No thyromegaly present.  Cardiovascular: Normal rate, regular rhythm and intact distal pulses.   Respiratory: Effort normal and breath sounds normal. No respiratory distress.  GI: Soft. He exhibits no distension. There is no tenderness.  Musculoskeletal:  Is able to move all extremities Has 2+ lower extremity edema  Neurological: He is alert.  Skin: Skin is warm and dry. He is not diaphoretic.      ASSESSMENT/ PLAN:  1.  Hypertension: is stable will continue lopressor 75 mg twice daily and will monitor  2.  Dysphagia: no signs of aspiration present; will continue nectar thick liquids and will monitor his status.   3. Hyponatremia is on nacl 1 gm daily with a na++ of 140; will not make changes will monitor  4. BPH: will continue flomax daily  5. Seizure: no reports of seizure activity present. Is presently not on medications; will continue to monitor his status.   6. Anemia: is stable hgb is 12.4 will monitor       [employer]  Jesus Bejar NP [facility]  Contact [phone] Monday through Friday 8am- 5pm  After hours call [phone]

## 2015-04-22 ENCOUNTER — Non-Acute Institutional Stay (SKILLED_NURSING_FACILITY): Payer: Medicare Other | Admitting: Adult Health

## 2015-04-22 DIAGNOSIS — R1314 Dysphagia, pharyngoesophageal phase: Secondary | ICD-10-CM

## 2015-04-22 DIAGNOSIS — N400 Enlarged prostate without lower urinary tract symptoms: Secondary | ICD-10-CM

## 2015-04-22 DIAGNOSIS — I10 Essential (primary) hypertension: Secondary | ICD-10-CM | POA: Diagnosis not present

## 2015-04-22 DIAGNOSIS — E871 Hypo-osmolality and hyponatremia: Secondary | ICD-10-CM

## 2015-04-22 DIAGNOSIS — D649 Anemia, unspecified: Secondary | ICD-10-CM | POA: Diagnosis not present

## 2015-04-22 DIAGNOSIS — R569 Unspecified convulsions: Secondary | ICD-10-CM | POA: Diagnosis not present

## 2015-05-08 NOTE — Progress Notes (Signed)
Patient ID: Jesus Ramsey, male   DOB: 02-08-1931, 81 y.o.   MRN: [id number]  [name] living [place]     No Known Allergies     Chief Complaint  Patient presents with  . Medical Management of Chronic Issues    HPI:  He is a long term resident of this facility being seen for the management of his chronic illnesses. Overall his status remains stable. He is unable to fully participate in the hpi or ros; but states he feels fine. There are no nursing concerns at this time.    Past Medical History  Diagnosis Date  . Mental retardation   . Hypertension   . Seizures   . Pneumonia   . Psychogenic polydipsia   . B12 deficiency anemia   . Cataract   . Altered mental status   . BPH (benign prostatic hypertrophy)   . Chronic hyponatremia     [name] 01/13/2012  . B12 deficiency 01/14/2012    Past Surgical History  Procedure Laterality Date  . No past surgeries      VITAL SIGNS BP 123/75 mmHg  Pulse 98  Ht 5\' 8"  (1.727 m)  Wt 153 lb (69.4 kg)  BMI 23.27 kg/m2   Outpatient Encounter Prescriptions as of 03/11/2015  Medication Sig  . acetaminophen (TYLENOL) 325 MG tablet Take 650 mg by mouth every 6 (six) hours as needed. pain  . aspirin 81 MG chewable tablet Chew 81 mg by mouth daily.  . food thickener (THICK IT) POWD Take 1 Container by mouth as needed. For nectar thick liquids  . magnesium hydroxide (MILK OF MAGNESIA) 400 MG/5ML suspension Take 30 mLs by mouth daily as needed. For constipation  . metoprolol (LOPRESSOR) 50 MG tablet Take 75 mg by mouth 2 (two) times daily.  . sodium chloride 1 G tablet Take 1 g by mouth every morning.  . Tamsulosin HCl (FLOMAX) 0.4 MG CAPS Take 0.4 mg by mouth daily.       SIGNIFICANT DIAGNOSTIC EXAMS  09-17-14: chest x-ray: no active disease seen in chest   LABS REVIEWED;   08-13-14: wbc 5.7; hgb 12.4; hct 37.4; mcv 88 plt 332; glucose 80; bun 14; creat 0.78; k+4.6; na++133 ferritin 360; pre-albumin 13.1 10-05-14: glucose 75; bun 13;  creat 0.69; k+4.0; na++ 140     ROS Unable to perform ROS    Physical Exam Constitutional: No distress.  Thin   Neck: Neck supple. No JVD present. No thyromegaly present.  Cardiovascular: Normal rate, regular rhythm and intact distal pulses.   Respiratory: Effort normal and breath sounds normal. No respiratory distress.  GI: Soft. He exhibits no distension. There is no tenderness.  Musculoskeletal:  Is able to move all extremities Has 2+ lower extremity edema  Neurological: He is alert.  Skin: Skin is warm and dry. He is not diaphoretic.     ASSESSMENT/ PLAN:  1.  Hypertension: is stable will continue lopressor 75 mg twice daily and will monitor  2.  Dysphagia: no signs of aspiration present; will continue nectar thick liquids and will monitor his status.   3. Hyponatremia is on nacl 1 gm daily with a na++ of 140; will not make changes will monitor  4. BPH: will continue flomax daily  5. Seizure: no reports of seizure activity present. Is presently not on medications; will continue to monitor his status.   6. Anemia: is stable hgb is 12.4 will monitor   Will check cbc; cmp      [name] NP [facility]  Adult Medicine  Contact [phone] Monday through Friday 8am- 5pm  After hours call [phone]

## 2015-05-24 ENCOUNTER — Non-Acute Institutional Stay (SKILLED_NURSING_FACILITY): Payer: Medicare Other | Admitting: Adult Health

## 2015-05-24 DIAGNOSIS — I10 Essential (primary) hypertension: Secondary | ICD-10-CM

## 2015-05-24 DIAGNOSIS — D649 Anemia, unspecified: Secondary | ICD-10-CM | POA: Diagnosis not present

## 2015-05-24 DIAGNOSIS — E871 Hypo-osmolality and hyponatremia: Secondary | ICD-10-CM

## 2015-05-24 DIAGNOSIS — R1314 Dysphagia, pharyngoesophageal phase: Secondary | ICD-10-CM

## 2015-05-24 DIAGNOSIS — R569 Unspecified convulsions: Secondary | ICD-10-CM

## 2015-05-24 DIAGNOSIS — N400 Enlarged prostate without lower urinary tract symptoms: Secondary | ICD-10-CM | POA: Diagnosis not present

## 2015-06-06 DIAGNOSIS — J189 Pneumonia, unspecified organism: Secondary | ICD-10-CM | POA: Diagnosis not present

## 2015-06-06 DIAGNOSIS — D291 Benign neoplasm of prostate: Secondary | ICD-10-CM | POA: Diagnosis not present

## 2015-06-06 DIAGNOSIS — J180 Bronchopneumonia, unspecified organism: Secondary | ICD-10-CM | POA: Diagnosis not present

## 2015-06-06 DIAGNOSIS — R4182 Altered mental status, unspecified: Secondary | ICD-10-CM | POA: Diagnosis not present

## 2015-06-06 DIAGNOSIS — I10 Essential (primary) hypertension: Secondary | ICD-10-CM | POA: Diagnosis not present

## 2015-06-06 DIAGNOSIS — E876 Hypokalemia: Secondary | ICD-10-CM | POA: Diagnosis not present

## 2015-06-06 DIAGNOSIS — Z5189 Encounter for other specified aftercare: Secondary | ICD-10-CM | POA: Diagnosis not present

## 2015-06-06 DIAGNOSIS — G459 Transient cerebral ischemic attack, unspecified: Secondary | ICD-10-CM | POA: Diagnosis not present

## 2015-06-06 DIAGNOSIS — R1312 Dysphagia, oropharyngeal phase: Secondary | ICD-10-CM | POA: Diagnosis not present

## 2015-06-06 DIAGNOSIS — R41841 Cognitive communication deficit: Secondary | ICD-10-CM | POA: Diagnosis not present

## 2015-06-07 DIAGNOSIS — E876 Hypokalemia: Secondary | ICD-10-CM | POA: Diagnosis not present

## 2015-06-07 DIAGNOSIS — G459 Transient cerebral ischemic attack, unspecified: Secondary | ICD-10-CM | POA: Diagnosis not present

## 2015-06-07 DIAGNOSIS — R4182 Altered mental status, unspecified: Secondary | ICD-10-CM | POA: Diagnosis not present

## 2015-06-07 DIAGNOSIS — I10 Essential (primary) hypertension: Secondary | ICD-10-CM | POA: Diagnosis not present

## 2015-06-07 DIAGNOSIS — J180 Bronchopneumonia, unspecified organism: Secondary | ICD-10-CM | POA: Diagnosis not present

## 2015-06-07 DIAGNOSIS — D291 Benign neoplasm of prostate: Secondary | ICD-10-CM | POA: Diagnosis not present

## 2015-06-10 DIAGNOSIS — J180 Bronchopneumonia, unspecified organism: Secondary | ICD-10-CM | POA: Diagnosis not present

## 2015-06-10 DIAGNOSIS — I10 Essential (primary) hypertension: Secondary | ICD-10-CM | POA: Diagnosis not present

## 2015-06-10 DIAGNOSIS — R41841 Cognitive communication deficit: Secondary | ICD-10-CM | POA: Diagnosis not present

## 2015-06-10 DIAGNOSIS — R4182 Altered mental status, unspecified: Secondary | ICD-10-CM | POA: Diagnosis not present

## 2015-06-10 DIAGNOSIS — J189 Pneumonia, unspecified organism: Secondary | ICD-10-CM | POA: Diagnosis not present

## 2015-06-10 DIAGNOSIS — G459 Transient cerebral ischemic attack, unspecified: Secondary | ICD-10-CM | POA: Diagnosis not present

## 2015-06-10 DIAGNOSIS — E876 Hypokalemia: Secondary | ICD-10-CM | POA: Diagnosis not present

## 2015-06-10 DIAGNOSIS — R1312 Dysphagia, oropharyngeal phase: Secondary | ICD-10-CM | POA: Diagnosis not present

## 2015-06-10 DIAGNOSIS — D291 Benign neoplasm of prostate: Secondary | ICD-10-CM | POA: Diagnosis not present

## 2015-06-10 DIAGNOSIS — Z5189 Encounter for other specified aftercare: Secondary | ICD-10-CM | POA: Diagnosis not present

## 2015-06-11 DIAGNOSIS — R4182 Altered mental status, unspecified: Secondary | ICD-10-CM | POA: Diagnosis not present

## 2015-06-11 DIAGNOSIS — I10 Essential (primary) hypertension: Secondary | ICD-10-CM | POA: Diagnosis not present

## 2015-06-11 DIAGNOSIS — J180 Bronchopneumonia, unspecified organism: Secondary | ICD-10-CM | POA: Diagnosis not present

## 2015-06-11 DIAGNOSIS — E876 Hypokalemia: Secondary | ICD-10-CM | POA: Diagnosis not present

## 2015-06-11 DIAGNOSIS — G459 Transient cerebral ischemic attack, unspecified: Secondary | ICD-10-CM | POA: Diagnosis not present

## 2015-06-11 DIAGNOSIS — D291 Benign neoplasm of prostate: Secondary | ICD-10-CM | POA: Diagnosis not present

## 2015-06-12 DIAGNOSIS — G459 Transient cerebral ischemic attack, unspecified: Secondary | ICD-10-CM | POA: Diagnosis not present

## 2015-06-12 DIAGNOSIS — J180 Bronchopneumonia, unspecified organism: Secondary | ICD-10-CM | POA: Diagnosis not present

## 2015-06-12 DIAGNOSIS — I10 Essential (primary) hypertension: Secondary | ICD-10-CM | POA: Diagnosis not present

## 2015-06-12 DIAGNOSIS — D291 Benign neoplasm of prostate: Secondary | ICD-10-CM | POA: Diagnosis not present

## 2015-06-12 DIAGNOSIS — R4182 Altered mental status, unspecified: Secondary | ICD-10-CM | POA: Diagnosis not present

## 2015-06-12 DIAGNOSIS — E876 Hypokalemia: Secondary | ICD-10-CM | POA: Diagnosis not present

## 2015-06-13 DIAGNOSIS — J180 Bronchopneumonia, unspecified organism: Secondary | ICD-10-CM | POA: Diagnosis not present

## 2015-06-13 DIAGNOSIS — G459 Transient cerebral ischemic attack, unspecified: Secondary | ICD-10-CM | POA: Diagnosis not present

## 2015-06-13 DIAGNOSIS — E876 Hypokalemia: Secondary | ICD-10-CM | POA: Diagnosis not present

## 2015-06-13 DIAGNOSIS — D291 Benign neoplasm of prostate: Secondary | ICD-10-CM | POA: Diagnosis not present

## 2015-06-13 DIAGNOSIS — I10 Essential (primary) hypertension: Secondary | ICD-10-CM | POA: Diagnosis not present

## 2015-06-13 DIAGNOSIS — R4182 Altered mental status, unspecified: Secondary | ICD-10-CM | POA: Diagnosis not present

## 2015-06-14 DIAGNOSIS — I10 Essential (primary) hypertension: Secondary | ICD-10-CM | POA: Diagnosis not present

## 2015-06-14 DIAGNOSIS — J180 Bronchopneumonia, unspecified organism: Secondary | ICD-10-CM | POA: Diagnosis not present

## 2015-06-14 DIAGNOSIS — R4182 Altered mental status, unspecified: Secondary | ICD-10-CM | POA: Diagnosis not present

## 2015-06-14 DIAGNOSIS — D291 Benign neoplasm of prostate: Secondary | ICD-10-CM | POA: Diagnosis not present

## 2015-06-14 DIAGNOSIS — G459 Transient cerebral ischemic attack, unspecified: Secondary | ICD-10-CM | POA: Diagnosis not present

## 2015-06-14 DIAGNOSIS — E876 Hypokalemia: Secondary | ICD-10-CM | POA: Diagnosis not present

## 2015-06-17 DIAGNOSIS — G459 Transient cerebral ischemic attack, unspecified: Secondary | ICD-10-CM | POA: Diagnosis not present

## 2015-06-17 DIAGNOSIS — R4182 Altered mental status, unspecified: Secondary | ICD-10-CM | POA: Diagnosis not present

## 2015-06-17 DIAGNOSIS — D291 Benign neoplasm of prostate: Secondary | ICD-10-CM | POA: Diagnosis not present

## 2015-06-17 DIAGNOSIS — I10 Essential (primary) hypertension: Secondary | ICD-10-CM | POA: Diagnosis not present

## 2015-06-17 DIAGNOSIS — J180 Bronchopneumonia, unspecified organism: Secondary | ICD-10-CM | POA: Diagnosis not present

## 2015-06-17 DIAGNOSIS — E876 Hypokalemia: Secondary | ICD-10-CM | POA: Diagnosis not present

## 2015-06-18 DIAGNOSIS — I10 Essential (primary) hypertension: Secondary | ICD-10-CM | POA: Diagnosis not present

## 2015-06-18 DIAGNOSIS — R4182 Altered mental status, unspecified: Secondary | ICD-10-CM | POA: Diagnosis not present

## 2015-06-18 DIAGNOSIS — G459 Transient cerebral ischemic attack, unspecified: Secondary | ICD-10-CM | POA: Diagnosis not present

## 2015-06-18 DIAGNOSIS — J180 Bronchopneumonia, unspecified organism: Secondary | ICD-10-CM | POA: Diagnosis not present

## 2015-06-18 DIAGNOSIS — D291 Benign neoplasm of prostate: Secondary | ICD-10-CM | POA: Diagnosis not present

## 2015-06-18 DIAGNOSIS — E876 Hypokalemia: Secondary | ICD-10-CM | POA: Diagnosis not present

## 2015-06-19 DIAGNOSIS — G459 Transient cerebral ischemic attack, unspecified: Secondary | ICD-10-CM | POA: Diagnosis not present

## 2015-06-19 DIAGNOSIS — I10 Essential (primary) hypertension: Secondary | ICD-10-CM | POA: Diagnosis not present

## 2015-06-19 DIAGNOSIS — D291 Benign neoplasm of prostate: Secondary | ICD-10-CM | POA: Diagnosis not present

## 2015-06-19 DIAGNOSIS — R4182 Altered mental status, unspecified: Secondary | ICD-10-CM | POA: Diagnosis not present

## 2015-06-19 DIAGNOSIS — J180 Bronchopneumonia, unspecified organism: Secondary | ICD-10-CM | POA: Diagnosis not present

## 2015-06-19 DIAGNOSIS — E876 Hypokalemia: Secondary | ICD-10-CM | POA: Diagnosis not present

## 2015-06-20 DIAGNOSIS — G459 Transient cerebral ischemic attack, unspecified: Secondary | ICD-10-CM | POA: Diagnosis not present

## 2015-06-20 DIAGNOSIS — E876 Hypokalemia: Secondary | ICD-10-CM | POA: Diagnosis not present

## 2015-06-20 DIAGNOSIS — J180 Bronchopneumonia, unspecified organism: Secondary | ICD-10-CM | POA: Diagnosis not present

## 2015-06-20 DIAGNOSIS — D291 Benign neoplasm of prostate: Secondary | ICD-10-CM | POA: Diagnosis not present

## 2015-06-20 DIAGNOSIS — I10 Essential (primary) hypertension: Secondary | ICD-10-CM | POA: Diagnosis not present

## 2015-06-20 DIAGNOSIS — R4182 Altered mental status, unspecified: Secondary | ICD-10-CM | POA: Diagnosis not present

## 2015-06-21 DIAGNOSIS — R4182 Altered mental status, unspecified: Secondary | ICD-10-CM | POA: Diagnosis not present

## 2015-06-21 DIAGNOSIS — I10 Essential (primary) hypertension: Secondary | ICD-10-CM | POA: Diagnosis not present

## 2015-06-21 DIAGNOSIS — E876 Hypokalemia: Secondary | ICD-10-CM | POA: Diagnosis not present

## 2015-06-21 DIAGNOSIS — G459 Transient cerebral ischemic attack, unspecified: Secondary | ICD-10-CM | POA: Diagnosis not present

## 2015-06-21 DIAGNOSIS — J180 Bronchopneumonia, unspecified organism: Secondary | ICD-10-CM | POA: Diagnosis not present

## 2015-06-21 DIAGNOSIS — D291 Benign neoplasm of prostate: Secondary | ICD-10-CM | POA: Diagnosis not present

## 2015-06-24 DIAGNOSIS — D291 Benign neoplasm of prostate: Secondary | ICD-10-CM | POA: Diagnosis not present

## 2015-06-24 DIAGNOSIS — J180 Bronchopneumonia, unspecified organism: Secondary | ICD-10-CM | POA: Diagnosis not present

## 2015-06-24 DIAGNOSIS — E876 Hypokalemia: Secondary | ICD-10-CM | POA: Diagnosis not present

## 2015-06-24 DIAGNOSIS — R4182 Altered mental status, unspecified: Secondary | ICD-10-CM | POA: Diagnosis not present

## 2015-06-24 DIAGNOSIS — I10 Essential (primary) hypertension: Secondary | ICD-10-CM | POA: Diagnosis not present

## 2015-06-24 DIAGNOSIS — G459 Transient cerebral ischemic attack, unspecified: Secondary | ICD-10-CM | POA: Diagnosis not present

## 2015-06-25 DIAGNOSIS — J180 Bronchopneumonia, unspecified organism: Secondary | ICD-10-CM | POA: Diagnosis not present

## 2015-06-25 DIAGNOSIS — I10 Essential (primary) hypertension: Secondary | ICD-10-CM | POA: Diagnosis not present

## 2015-06-25 DIAGNOSIS — R4182 Altered mental status, unspecified: Secondary | ICD-10-CM | POA: Diagnosis not present

## 2015-06-25 DIAGNOSIS — E876 Hypokalemia: Secondary | ICD-10-CM | POA: Diagnosis not present

## 2015-06-25 DIAGNOSIS — B351 Tinea unguium: Secondary | ICD-10-CM | POA: Diagnosis not present

## 2015-06-25 DIAGNOSIS — D291 Benign neoplasm of prostate: Secondary | ICD-10-CM | POA: Diagnosis not present

## 2015-06-25 DIAGNOSIS — M79674 Pain in right toe(s): Secondary | ICD-10-CM | POA: Diagnosis not present

## 2015-06-25 DIAGNOSIS — M79675 Pain in left toe(s): Secondary | ICD-10-CM | POA: Diagnosis not present

## 2015-06-25 DIAGNOSIS — G459 Transient cerebral ischemic attack, unspecified: Secondary | ICD-10-CM | POA: Diagnosis not present

## 2015-06-25 DIAGNOSIS — I70203 Unspecified atherosclerosis of native arteries of extremities, bilateral legs: Secondary | ICD-10-CM | POA: Diagnosis not present

## 2015-06-26 DIAGNOSIS — G459 Transient cerebral ischemic attack, unspecified: Secondary | ICD-10-CM | POA: Diagnosis not present

## 2015-06-26 DIAGNOSIS — R4182 Altered mental status, unspecified: Secondary | ICD-10-CM | POA: Diagnosis not present

## 2015-06-26 DIAGNOSIS — E876 Hypokalemia: Secondary | ICD-10-CM | POA: Diagnosis not present

## 2015-06-26 DIAGNOSIS — J180 Bronchopneumonia, unspecified organism: Secondary | ICD-10-CM | POA: Diagnosis not present

## 2015-06-26 DIAGNOSIS — D291 Benign neoplasm of prostate: Secondary | ICD-10-CM | POA: Diagnosis not present

## 2015-06-26 DIAGNOSIS — I10 Essential (primary) hypertension: Secondary | ICD-10-CM | POA: Diagnosis not present

## 2015-06-27 DIAGNOSIS — G459 Transient cerebral ischemic attack, unspecified: Secondary | ICD-10-CM | POA: Diagnosis not present

## 2015-06-27 DIAGNOSIS — D291 Benign neoplasm of prostate: Secondary | ICD-10-CM | POA: Diagnosis not present

## 2015-06-27 DIAGNOSIS — R4182 Altered mental status, unspecified: Secondary | ICD-10-CM | POA: Diagnosis not present

## 2015-06-27 DIAGNOSIS — J180 Bronchopneumonia, unspecified organism: Secondary | ICD-10-CM | POA: Diagnosis not present

## 2015-06-27 DIAGNOSIS — E876 Hypokalemia: Secondary | ICD-10-CM | POA: Diagnosis not present

## 2015-06-27 DIAGNOSIS — I10 Essential (primary) hypertension: Secondary | ICD-10-CM | POA: Diagnosis not present

## 2015-06-28 DIAGNOSIS — J180 Bronchopneumonia, unspecified organism: Secondary | ICD-10-CM | POA: Diagnosis not present

## 2015-06-28 DIAGNOSIS — R4182 Altered mental status, unspecified: Secondary | ICD-10-CM | POA: Diagnosis not present

## 2015-06-28 DIAGNOSIS — E876 Hypokalemia: Secondary | ICD-10-CM | POA: Diagnosis not present

## 2015-06-28 DIAGNOSIS — D291 Benign neoplasm of prostate: Secondary | ICD-10-CM | POA: Diagnosis not present

## 2015-06-28 DIAGNOSIS — I10 Essential (primary) hypertension: Secondary | ICD-10-CM | POA: Diagnosis not present

## 2015-06-28 DIAGNOSIS — G459 Transient cerebral ischemic attack, unspecified: Secondary | ICD-10-CM | POA: Diagnosis not present

## 2015-06-30 DIAGNOSIS — G459 Transient cerebral ischemic attack, unspecified: Secondary | ICD-10-CM | POA: Diagnosis not present

## 2015-06-30 DIAGNOSIS — J180 Bronchopneumonia, unspecified organism: Secondary | ICD-10-CM | POA: Diagnosis not present

## 2015-06-30 DIAGNOSIS — R4182 Altered mental status, unspecified: Secondary | ICD-10-CM | POA: Diagnosis not present

## 2015-06-30 DIAGNOSIS — I10 Essential (primary) hypertension: Secondary | ICD-10-CM | POA: Diagnosis not present

## 2015-06-30 DIAGNOSIS — D291 Benign neoplasm of prostate: Secondary | ICD-10-CM | POA: Diagnosis not present

## 2015-06-30 DIAGNOSIS — E876 Hypokalemia: Secondary | ICD-10-CM | POA: Diagnosis not present

## 2015-07-01 ENCOUNTER — Encounter: Payer: Self-pay | Admitting: Adult Health

## 2015-07-01 DIAGNOSIS — R4182 Altered mental status, unspecified: Secondary | ICD-10-CM | POA: Diagnosis not present

## 2015-07-01 DIAGNOSIS — G459 Transient cerebral ischemic attack, unspecified: Secondary | ICD-10-CM | POA: Diagnosis not present

## 2015-07-01 DIAGNOSIS — J180 Bronchopneumonia, unspecified organism: Secondary | ICD-10-CM | POA: Diagnosis not present

## 2015-07-01 DIAGNOSIS — I10 Essential (primary) hypertension: Secondary | ICD-10-CM | POA: Diagnosis not present

## 2015-07-01 DIAGNOSIS — E876 Hypokalemia: Secondary | ICD-10-CM | POA: Diagnosis not present

## 2015-07-01 DIAGNOSIS — D291 Benign neoplasm of prostate: Secondary | ICD-10-CM | POA: Diagnosis not present

## 2015-07-01 NOTE — Progress Notes (Signed)
Patient ID: Jesus Ramsey, male   DOB: 06-Jan-1931, 81 y.o.   MRN: [id number]   Facility: [facility]      No Known Allergies  Chief Complaint  Patient presents with  . Medical Management of Chronic Issues    HPI:  He is a long term resident of this facility being seen for the management of his chronic illnesses. Overall there is little change in his status. He is unable to fully participate in the hpi or ros; but states that he is feeling good. There are no nursing concerns at this time.    Past Medical History  Diagnosis Date  . Mental retardation   . Hypertension   . Seizures   . Pneumonia   . Psychogenic polydipsia   . B12 deficiency anemia   . Cataract   . Altered mental status   . BPH (benign prostatic hypertrophy)   . Chronic hyponatremia     [name] 01/13/2012  . B12 deficiency 01/14/2012    Past Surgical History  Procedure Laterality Date  . No past surgeries      VITAL SIGNS BP 120/70 mmHg  Pulse 60  Ht 5\' 8"  (1.727 m)  Wt 154 lb (69.854 kg)  BMI 23.42 kg/m2  Patient's Medications  New Prescriptions   No medications on file  Previous Medications   ACETAMINOPHEN (TYLENOL) 325 MG TABLET    Take 650 mg by mouth every 6 (six) hours as needed. pain   ASPIRIN 81 MG CHEWABLE TABLET    Chew 81 mg by mouth daily.   FOOD THICKENER (THICK IT) POWD    Take 1 Container by mouth as needed. For nectar thick liquids   MAGNESIUM HYDROXIDE (MILK OF MAGNESIA) 400 MG/5ML SUSPENSION    Take 30 mLs by mouth daily as needed. For constipation   METOPROLOL (LOPRESSOR) 50 MG TABLET    Take 75 mg by mouth 2 (two) times daily.   SODIUM CHLORIDE 1 G TABLET    Take 1 g by mouth every morning.   TAMSULOSIN HCL (FLOMAX) 0.4 MG CAPS    Take 0.4 mg by mouth daily.   Modified Medications   No medications on file  Discontinued Medications   No medications on file     SIGNIFICANT DIAGNOSTIC EXAMS  09-17-14: chest x-ray: no active disease seen in chest   LABS REVIEWED;    08-13-14: wbc 5.7; hgb 12.4; hct 37.4; mcv 88 plt 332; glucose 80; bun 14; creat 0.78; k+4.6; na++133 ferritin 360; pre-albumin 13.1 10-05-14: glucose 75; bun 13; creat 0.69; k+4.0; na++ 140  03-13-15: wbc 6.7; hgb 12.9; hct 39.1; mcv 83.2 plt 311; glucose 90; bun 14; creat 0.65; k+4.4; na++136; liver normal albumin 3.2      Review of Systems  Unable to perform ROS: Other      Physical Exam  Constitutional: No distress.  Eyes: Conjunctivae are normal.  Neck: Neck supple. No JVD present. No thyromegaly present.  Cardiovascular: Normal rate, regular rhythm and intact distal pulses.   Respiratory: Effort normal and breath sounds normal. No respiratory distress. He has no wheezes.  GI: Soft. Bowel sounds are normal. He exhibits no distension. There is no tenderness.  Musculoskeletal:  Able to move all extremities Has 2+ bilateral lower extremity edma    Lymphadenopathy:    He has no cervical adenopathy.  Neurological: He is alert.  Skin: Skin is warm and dry. He is not diaphoretic.  Psychiatric: He has a normal mood and affect.  ASSESSMENT/ PLAN:  1.  Hypertension: is stable will continue lopressor 75 mg twice daily and will monitor  2.  Dysphagia: no signs of aspiration present; will continue nectar thick liquids and will monitor his status.   3. Hyponatremia is on nacl 1 gm daily with a na++ of 136; will not make changes will monitor  4. BPH: will continue flomax 0.4 mg  daily  5. Seizure: no reports of seizure activity present. Is presently not on medications; will continue to monitor his status.   6. Anemia: is stable hgb is 12.4 will monitor      [name] NP [facility] Adult Medicine  Contact [phone] Monday through Friday 8am- 5pm  After hours call [phone]

## 2015-07-02 DIAGNOSIS — R4182 Altered mental status, unspecified: Secondary | ICD-10-CM | POA: Diagnosis not present

## 2015-07-02 DIAGNOSIS — D291 Benign neoplasm of prostate: Secondary | ICD-10-CM | POA: Diagnosis not present

## 2015-07-02 DIAGNOSIS — I10 Essential (primary) hypertension: Secondary | ICD-10-CM | POA: Diagnosis not present

## 2015-07-02 DIAGNOSIS — J180 Bronchopneumonia, unspecified organism: Secondary | ICD-10-CM | POA: Diagnosis not present

## 2015-07-02 DIAGNOSIS — E876 Hypokalemia: Secondary | ICD-10-CM | POA: Diagnosis not present

## 2015-07-02 DIAGNOSIS — G459 Transient cerebral ischemic attack, unspecified: Secondary | ICD-10-CM | POA: Diagnosis not present

## 2015-07-04 DIAGNOSIS — I10 Essential (primary) hypertension: Secondary | ICD-10-CM | POA: Diagnosis not present

## 2015-07-04 DIAGNOSIS — E876 Hypokalemia: Secondary | ICD-10-CM | POA: Diagnosis not present

## 2015-07-04 DIAGNOSIS — R4182 Altered mental status, unspecified: Secondary | ICD-10-CM | POA: Diagnosis not present

## 2015-07-04 DIAGNOSIS — G459 Transient cerebral ischemic attack, unspecified: Secondary | ICD-10-CM | POA: Diagnosis not present

## 2015-07-04 DIAGNOSIS — J180 Bronchopneumonia, unspecified organism: Secondary | ICD-10-CM | POA: Diagnosis not present

## 2015-07-04 DIAGNOSIS — D291 Benign neoplasm of prostate: Secondary | ICD-10-CM | POA: Diagnosis not present

## 2015-07-05 DIAGNOSIS — G459 Transient cerebral ischemic attack, unspecified: Secondary | ICD-10-CM | POA: Diagnosis not present

## 2015-07-05 DIAGNOSIS — E876 Hypokalemia: Secondary | ICD-10-CM | POA: Diagnosis not present

## 2015-07-05 DIAGNOSIS — R4182 Altered mental status, unspecified: Secondary | ICD-10-CM | POA: Diagnosis not present

## 2015-07-05 DIAGNOSIS — J180 Bronchopneumonia, unspecified organism: Secondary | ICD-10-CM | POA: Diagnosis not present

## 2015-07-05 DIAGNOSIS — D291 Benign neoplasm of prostate: Secondary | ICD-10-CM | POA: Diagnosis not present

## 2015-07-05 DIAGNOSIS — I10 Essential (primary) hypertension: Secondary | ICD-10-CM | POA: Diagnosis not present

## 2015-07-08 DIAGNOSIS — J180 Bronchopneumonia, unspecified organism: Secondary | ICD-10-CM | POA: Diagnosis not present

## 2015-07-08 DIAGNOSIS — R4182 Altered mental status, unspecified: Secondary | ICD-10-CM | POA: Diagnosis not present

## 2015-07-08 DIAGNOSIS — D291 Benign neoplasm of prostate: Secondary | ICD-10-CM | POA: Diagnosis not present

## 2015-07-08 DIAGNOSIS — I10 Essential (primary) hypertension: Secondary | ICD-10-CM | POA: Diagnosis not present

## 2015-07-08 DIAGNOSIS — E876 Hypokalemia: Secondary | ICD-10-CM | POA: Diagnosis not present

## 2015-07-08 DIAGNOSIS — G459 Transient cerebral ischemic attack, unspecified: Secondary | ICD-10-CM | POA: Diagnosis not present

## 2015-07-09 DIAGNOSIS — G459 Transient cerebral ischemic attack, unspecified: Secondary | ICD-10-CM | POA: Diagnosis not present

## 2015-07-09 DIAGNOSIS — D291 Benign neoplasm of prostate: Secondary | ICD-10-CM | POA: Diagnosis not present

## 2015-07-09 DIAGNOSIS — I10 Essential (primary) hypertension: Secondary | ICD-10-CM | POA: Diagnosis not present

## 2015-07-09 DIAGNOSIS — E876 Hypokalemia: Secondary | ICD-10-CM | POA: Diagnosis not present

## 2015-07-09 DIAGNOSIS — R4182 Altered mental status, unspecified: Secondary | ICD-10-CM | POA: Diagnosis not present

## 2015-07-09 DIAGNOSIS — J180 Bronchopneumonia, unspecified organism: Secondary | ICD-10-CM | POA: Diagnosis not present

## 2015-07-10 DIAGNOSIS — R4182 Altered mental status, unspecified: Secondary | ICD-10-CM | POA: Diagnosis not present

## 2015-07-10 DIAGNOSIS — D291 Benign neoplasm of prostate: Secondary | ICD-10-CM | POA: Diagnosis not present

## 2015-07-10 DIAGNOSIS — G459 Transient cerebral ischemic attack, unspecified: Secondary | ICD-10-CM | POA: Diagnosis not present

## 2015-07-10 DIAGNOSIS — J180 Bronchopneumonia, unspecified organism: Secondary | ICD-10-CM | POA: Diagnosis not present

## 2015-07-10 DIAGNOSIS — I10 Essential (primary) hypertension: Secondary | ICD-10-CM | POA: Diagnosis not present

## 2015-07-10 DIAGNOSIS — E876 Hypokalemia: Secondary | ICD-10-CM | POA: Diagnosis not present

## 2015-07-11 DIAGNOSIS — E876 Hypokalemia: Secondary | ICD-10-CM | POA: Diagnosis not present

## 2015-07-11 DIAGNOSIS — Z5189 Encounter for other specified aftercare: Secondary | ICD-10-CM | POA: Diagnosis not present

## 2015-07-11 DIAGNOSIS — R4182 Altered mental status, unspecified: Secondary | ICD-10-CM | POA: Diagnosis not present

## 2015-07-11 DIAGNOSIS — J180 Bronchopneumonia, unspecified organism: Secondary | ICD-10-CM | POA: Diagnosis not present

## 2015-07-11 DIAGNOSIS — D291 Benign neoplasm of prostate: Secondary | ICD-10-CM | POA: Diagnosis not present

## 2015-07-11 DIAGNOSIS — J189 Pneumonia, unspecified organism: Secondary | ICD-10-CM | POA: Diagnosis not present

## 2015-07-11 DIAGNOSIS — R41841 Cognitive communication deficit: Secondary | ICD-10-CM | POA: Diagnosis not present

## 2015-07-11 DIAGNOSIS — R1312 Dysphagia, oropharyngeal phase: Secondary | ICD-10-CM | POA: Diagnosis not present

## 2015-07-11 DIAGNOSIS — G459 Transient cerebral ischemic attack, unspecified: Secondary | ICD-10-CM | POA: Diagnosis not present

## 2015-07-11 DIAGNOSIS — I10 Essential (primary) hypertension: Secondary | ICD-10-CM | POA: Diagnosis not present

## 2015-07-12 DIAGNOSIS — E876 Hypokalemia: Secondary | ICD-10-CM | POA: Diagnosis not present

## 2015-07-12 DIAGNOSIS — R4182 Altered mental status, unspecified: Secondary | ICD-10-CM | POA: Diagnosis not present

## 2015-07-12 DIAGNOSIS — G459 Transient cerebral ischemic attack, unspecified: Secondary | ICD-10-CM | POA: Diagnosis not present

## 2015-07-12 DIAGNOSIS — J180 Bronchopneumonia, unspecified organism: Secondary | ICD-10-CM | POA: Diagnosis not present

## 2015-07-12 DIAGNOSIS — D291 Benign neoplasm of prostate: Secondary | ICD-10-CM | POA: Diagnosis not present

## 2015-07-12 DIAGNOSIS — I10 Essential (primary) hypertension: Secondary | ICD-10-CM | POA: Diagnosis not present

## 2015-07-14 DIAGNOSIS — J180 Bronchopneumonia, unspecified organism: Secondary | ICD-10-CM | POA: Diagnosis not present

## 2015-07-14 DIAGNOSIS — I10 Essential (primary) hypertension: Secondary | ICD-10-CM | POA: Diagnosis not present

## 2015-07-14 DIAGNOSIS — G459 Transient cerebral ischemic attack, unspecified: Secondary | ICD-10-CM | POA: Diagnosis not present

## 2015-07-14 DIAGNOSIS — E876 Hypokalemia: Secondary | ICD-10-CM | POA: Diagnosis not present

## 2015-07-14 DIAGNOSIS — R4182 Altered mental status, unspecified: Secondary | ICD-10-CM | POA: Diagnosis not present

## 2015-07-14 DIAGNOSIS — D291 Benign neoplasm of prostate: Secondary | ICD-10-CM | POA: Diagnosis not present

## 2015-07-16 DIAGNOSIS — R4182 Altered mental status, unspecified: Secondary | ICD-10-CM | POA: Diagnosis not present

## 2015-07-16 DIAGNOSIS — G459 Transient cerebral ischemic attack, unspecified: Secondary | ICD-10-CM | POA: Diagnosis not present

## 2015-07-16 DIAGNOSIS — E876 Hypokalemia: Secondary | ICD-10-CM | POA: Diagnosis not present

## 2015-07-16 DIAGNOSIS — I10 Essential (primary) hypertension: Secondary | ICD-10-CM | POA: Diagnosis not present

## 2015-07-16 DIAGNOSIS — J180 Bronchopneumonia, unspecified organism: Secondary | ICD-10-CM | POA: Diagnosis not present

## 2015-07-16 DIAGNOSIS — D291 Benign neoplasm of prostate: Secondary | ICD-10-CM | POA: Diagnosis not present

## 2015-07-19 ENCOUNTER — Non-Acute Institutional Stay (SKILLED_NURSING_FACILITY): Payer: Medicare Other | Admitting: Adult Health

## 2015-07-19 DIAGNOSIS — D649 Anemia, unspecified: Secondary | ICD-10-CM

## 2015-07-19 DIAGNOSIS — I10 Essential (primary) hypertension: Secondary | ICD-10-CM

## 2015-07-19 DIAGNOSIS — R1314 Dysphagia, pharyngoesophageal phase: Secondary | ICD-10-CM

## 2015-07-19 DIAGNOSIS — E871 Hypo-osmolality and hyponatremia: Secondary | ICD-10-CM | POA: Diagnosis not present

## 2015-07-19 DIAGNOSIS — R569 Unspecified convulsions: Secondary | ICD-10-CM | POA: Diagnosis not present

## 2015-07-19 DIAGNOSIS — N400 Enlarged prostate without lower urinary tract symptoms: Secondary | ICD-10-CM | POA: Diagnosis not present

## 2015-07-30 DIAGNOSIS — H40033 Anatomical narrow angle, bilateral: Secondary | ICD-10-CM | POA: Diagnosis not present

## 2015-07-30 DIAGNOSIS — H2513 Age-related nuclear cataract, bilateral: Secondary | ICD-10-CM | POA: Diagnosis not present

## 2015-08-26 NOTE — Progress Notes (Signed)
Patient ID: Jesus Ramsey, male   DOB: 09-May-1931, 81 y.o.   MRN: [id number]   Facility: [facility]      No Known Allergies  Chief Complaint  Patient presents with  . Medical Management of Chronic Issues    HPI:  He is a long term resident of this facility being seen for the management of his chronic illnesses.  Overall his status remains without significant change. He is unable to fully participate in the hpi or ros; but is telling me that he is feeling good today. There are no nursing concerns today.     Past Medical History  Diagnosis Date  . Mental retardation   . Hypertension   . Seizures   . Pneumonia   . Psychogenic polydipsia   . B12 deficiency anemia   . Cataract   . Altered mental status   . BPH (benign prostatic hypertrophy)   . Chronic hyponatremia     Jesus Ramsey 01/13/2012  . B12 deficiency 01/14/2012    Past Surgical History  Procedure Laterality Date  . No past surgeries      VITAL SIGNS BP 132/68 mmHg  Pulse 76  Ht 5\' 8"  (1.727 m)  Wt 155 lb (70.308 kg)  BMI 23.57 kg/m2  Patient's Medications  New Prescriptions   No medications on file  Previous Medications   ACETAMINOPHEN (TYLENOL) 325 MG TABLET    Take 650 mg by mouth every 6 (six) hours as needed. pain   ASPIRIN 81 MG CHEWABLE TABLET    Chew 81 mg by mouth daily.   FOOD THICKENER (THICK IT) POWD    Take 1 Container by mouth as needed. For nectar thick liquids   MAGNESIUM HYDROXIDE (MILK OF MAGNESIA) 400 MG/5ML SUSPENSION    Take 30 mLs by mouth daily as needed. For constipation   METOPROLOL (LOPRESSOR) 50 MG TABLET    Take 75 mg by mouth 2 (two) times daily.   SODIUM CHLORIDE 1 G TABLET    Take 1 g by mouth every morning.   TAMSULOSIN HCL (FLOMAX) 0.4 MG CAPS    Take 0.4 mg by mouth daily.   Modified Medications   No medications on file  Discontinued Medications   No medications on file     SIGNIFICANT DIAGNOSTIC EXAMS   09-17-14: chest x-ray: no active disease seen in chest    LABS REVIEWED;   08-13-14: wbc 5.7; hgb 12.4; hct 37.4; mcv 88 plt 332; glucose 80; bun 14; creat 0.78; k+4.6; na++133 ferritin 360; pre-albumin 13.1 10-05-14: glucose 75; bun 13; creat 0.69; k+4.0; na++ 140  03-13-15: wbc 6.7; hgb 12.9; hct 39.1; mcv 83.2 plt 311; glucose 90; bun 14; creat 0.65; k+4.4; na++136; liver normal albumin 3.2     Review of Systems Unable to perform ROS: Other    Physical Exam Constitutional: No distress.  Eyes: Conjunctivae are normal.  Neck: Neck supple. No JVD present. No thyromegaly present.  Cardiovascular: Normal rate, regular rhythm and intact distal pulses.   Respiratory: Effort normal and breath sounds normal. No respiratory distress. He has no wheezes.  GI: Soft. Bowel sounds are normal. He exhibits no distension. There is no tenderness.  Musculoskeletal:  Able to move all extremities Has 2+ bilateral lower extremity edma    Lymphadenopathy:    He has no cervical adenopathy.  Neurological: He is alert.  Skin: Skin is warm and dry. He is not diaphoretic.  Psychiatric: He has a normal mood and affect    ASSESSMENT/ PLAN:  1.  Hypertension: is stable will continue lopressor 75 mg twice daily and will monitor  2.  Dysphagia: no signs of aspiration present; will continue nectar thick liquids and will monitor his status.   3. Hyponatremia is on nacl 1 gm daily with a na++ of 136; will not make changes will monitor  4. BPH: will continue flomax 0.4 mg  daily  5. Seizure: no reports of seizure activity present. Is presently not on medications; will continue to monitor his status.   6. Anemia: is stable hgb is 12.9 will monitor     Jesus Edwards NP [facility] Adult Medicine  Contact [phone] Monday through Friday 8am- 5pm  After hours call [phone]

## 2015-09-02 ENCOUNTER — Non-Acute Institutional Stay (SKILLED_NURSING_FACILITY): Payer: Medicare Other | Admitting: Adult Health

## 2015-09-02 DIAGNOSIS — R1314 Dysphagia, pharyngoesophageal phase: Secondary | ICD-10-CM

## 2015-09-02 DIAGNOSIS — N400 Enlarged prostate without lower urinary tract symptoms: Secondary | ICD-10-CM | POA: Diagnosis not present

## 2015-09-02 DIAGNOSIS — E871 Hypo-osmolality and hyponatremia: Secondary | ICD-10-CM | POA: Diagnosis not present

## 2015-09-02 DIAGNOSIS — I10 Essential (primary) hypertension: Secondary | ICD-10-CM | POA: Diagnosis not present

## 2015-09-02 DIAGNOSIS — R569 Unspecified convulsions: Secondary | ICD-10-CM | POA: Diagnosis not present

## 2015-09-02 DIAGNOSIS — D649 Anemia, unspecified: Secondary | ICD-10-CM | POA: Diagnosis not present

## 2015-09-12 ENCOUNTER — Encounter: Payer: Self-pay | Admitting: Adult Health

## 2015-09-12 NOTE — Progress Notes (Signed)
Patient ID: Jesus Ramsey, male   DOB: [birth year], 81 y.o.   MRN: [id number]    Facility: [facility]      No Known Allergies  Chief Complaint  Patient presents with  . Medical Management of Chronic Issues    HPI:  He is a long term resident of this facility being seen for the management of his chronic illnesses. Overall there is little change in his status.  He is unable to fully participate in the hpi or ros; he is telling me today that he is feeling good. There are no nursing concerns at this time.    Past Medical History  Diagnosis Date  . Mental retardation   . Hypertension   . Seizures ([place])   . Pneumonia   . Psychogenic polydipsia   . B12 deficiency anemia   . Cataract   . Altered mental status   . BPH (benign prostatic hypertrophy)   . Chronic hyponatremia     [name] 01/13/2012  . B12 deficiency 01/14/2012    Past Surgical History  Procedure Laterality Date  . No past surgeries      VITAL SIGNS BP 110/60 mmHg  Pulse 68  Ht 5\' 8"  (1.727 m)  Wt 158 lb (71.668 kg)  BMI 24.03 kg/m2  Patient's Medications  New Prescriptions   No medications on file  Previous Medications   ACETAMINOPHEN (TYLENOL) 325 MG TABLET    Take 650 mg by mouth every 6 (six) hours as needed. pain   ASPIRIN 81 MG CHEWABLE TABLET    Chew 81 mg by mouth daily.   FOOD THICKENER (THICK IT) POWD    Take 1 Container by mouth as needed. For nectar thick liquids   MAGNESIUM HYDROXIDE (MILK OF MAGNESIA) 400 MG/5ML SUSPENSION    Take 30 mLs by mouth daily as needed. For constipation   METOPROLOL (LOPRESSOR) 50 MG TABLET    Take 75 mg by mouth 2 (two) times daily.   SODIUM CHLORIDE 1 G TABLET    Take 1 g by mouth every morning.   TAMSULOSIN HCL (FLOMAX) 0.4 MG CAPS    Take 0.4 mg by mouth daily.   Modified Medications   No medications on file  Discontinued Medications   No medications on file     SIGNIFICANT DIAGNOSTIC EXAMS  09-17-14: chest x-ray: no active disease seen in chest     LABS REVIEWED;   08-13-14: wbc 5.7; hgb 12.4; hct 37.4; mcv 88 plt 332; glucose 80; bun 14; creat 0.78; k+4.6; na++133 ferritin 360; pre-albumin 13.1 10-05-14: glucose 75; bun 13; creat 0.69; k+4.0; na++ 140  03-13-15: wbc 6.7; hgb 12.9; hct 39.1; mcv 83.2 plt 311; glucose 90; bun 14; creat 0.65; k+4.4; na++136; liver normal albumin 3.2     Review of Systems Unable to perform ROS: Other      Physical Exam Constitutional: No distress.  Eyes: Conjunctivae are normal.  Neck: Neck supple. No JVD present. No thyromegaly present.  Cardiovascular: Normal rate, regular rhythm and intact distal pulses.   Respiratory: Effort normal and breath sounds normal. No respiratory distress. He has no wheezes.  GI: Soft. Bowel sounds are normal. He exhibits no distension. There is no tenderness.  Musculoskeletal:  Able to move all extremities Has 1+ bilateral lower extremity edma    Lymphadenopathy:    He has no cervical adenopathy.  Neurological: He is alert.  Skin: Skin is warm and dry. He is not diaphoretic.  Psychiatric: He has a normal mood and affect  ASSESSMENT/ PLAN:  1.  Hypertension: is stable will continue lopressor 75 mg twice daily and will monitor  2.  Dysphagia: no signs of aspiration present; will continue nectar thick liquids and will monitor his status.   3. Hyponatremia, is chronic: is on nacl 1 gm daily with a na++ of 136; will not make changes will monitor  4. BPH: will continue flomax 0.4 mg  daily  5. Seizure: no reports of seizure activity present. Is presently not on medications; will continue to monitor his status.   6. Anemia: is stable hgb is 12.9 will monitor   [name] NP [facility] Adult Medicine  Contact [phone] Monday through Friday 8am- 5pm  After hours call [phone]

## 2015-10-09 DIAGNOSIS — L84 Corns and callosities: Secondary | ICD-10-CM | POA: Diagnosis not present

## 2015-10-09 DIAGNOSIS — B351 Tinea unguium: Secondary | ICD-10-CM | POA: Diagnosis not present

## 2015-10-09 DIAGNOSIS — I2510 Atherosclerotic heart disease of native coronary artery without angina pectoris: Secondary | ICD-10-CM | POA: Diagnosis not present

## 2015-10-09 DIAGNOSIS — M79671 Pain in right foot: Secondary | ICD-10-CM | POA: Diagnosis not present

## 2015-10-15 ENCOUNTER — Non-Acute Institutional Stay (SKILLED_NURSING_FACILITY): Payer: Medicare Other | Admitting: Internal Medicine

## 2015-10-15 ENCOUNTER — Encounter: Payer: Self-pay | Admitting: Internal Medicine

## 2015-10-15 DIAGNOSIS — F79 Unspecified intellectual disabilities: Secondary | ICD-10-CM

## 2015-10-15 DIAGNOSIS — I10 Essential (primary) hypertension: Secondary | ICD-10-CM | POA: Diagnosis not present

## 2015-10-15 DIAGNOSIS — N400 Enlarged prostate without lower urinary tract symptoms: Secondary | ICD-10-CM | POA: Diagnosis not present

## 2015-10-15 DIAGNOSIS — R1314 Dysphagia, pharyngoesophageal phase: Secondary | ICD-10-CM

## 2015-10-15 NOTE — Progress Notes (Signed)
Patient ID: Jesus Ramsey, male   DOB: 1931-04-13, 81 y.o.   MRN: [id number]    DATE: 10/15/15  Location:  [place] of Service: [phone])   Extended Emergency Contact Information Primary Emergency Contact: Perreira,[name] Address: [address]          [place] [address] [name] Phone: [phone] Relation: Brother  Advanced Directive information  FULL CODE  Chief Complaint  Patient presents with  . Medical Management of Chronic Issues    HPI:  81 yo male long term resident seen today for f/u. He is a poor historian due to mentally challenged. Hx obtained from chart. No nursing issues. No falls.   HTN - BP stable on BB  Hyponatremia - stable. Will check lytes  Hx sz - none observed  BPH - stable on flomax  Past Medical History  Diagnosis Date  . Mental retardation   . Hypertension   . Seizures ([place])   . Pneumonia   . Psychogenic polydipsia   . B12 deficiency anemia   . Cataract   . Altered mental status   . BPH (benign prostatic hypertrophy)   . Chronic hyponatremia     [name] 01/13/2012  . B12 deficiency 01/14/2012    Past Surgical History  Procedure Laterality Date  . No past surgeries      Patient Care Team: [name], DO as PCP - General (Internal Medicine) [name], NP as Nurse Practitioner (Nurse Practitioner)  Social History   Social History  . Marital Status: Single    Spouse Name: N/A  . Number of Children: N/A  . Years of Education: N/A   Occupational History  . Not on file.   Social History Main Topics  . Smoking status: Former Research scientist (life sciences)  . Smokeless tobacco: Not on file  . Alcohol Use: No  . Drug Use: No  . Sexual Activity: Not on file   Other Topics Concern  . Not on file   Social History Narrative     reports that he has quit smoking. He does not have any smokeless tobacco history on file. He reports that he does not drink alcohol or use illicit  drugs.  Immunization History  Administered Date(s) Administered  . Influenza-Unspecified 08/22/2014    No Known Allergies  Medications: Patient's Medications  New Prescriptions   No medications on file  Previous Medications   ACETAMINOPHEN (TYLENOL) 325 MG TABLET    Take 650 mg by mouth every 6 (six) hours as needed. pain   ASPIRIN 81 MG CHEWABLE TABLET    Chew 81 mg by mouth daily.   FOOD THICKENER (THICK IT) POWD    Take 1 Container by mouth as needed. For nectar thick liquids   MAGNESIUM HYDROXIDE (MILK OF MAGNESIA) 400 MG/5ML SUSPENSION    Take 30 mLs by mouth daily as needed. For constipation   METOPROLOL (LOPRESSOR) 50 MG TABLET    Take 75 mg by mouth 2 (two) times daily.   SODIUM CHLORIDE 1 G TABLET    Take 1 g by mouth every morning.   TAMSULOSIN HCL (FLOMAX) 0.4 MG CAPS    Take 0.4 mg by mouth daily.   Modified Medications   No medications on file  Discontinued Medications   No medications on file    Review of Systems  Unable to perform ROS: Psychiatric disorder    Filed Vitals:   10/15/15 1424  BP: 128/74  Pulse: 84  Temp: 97.1  F (36.2 C)  Weight: 162 lb (73.483 kg)   Body mass index is 24.64 kg/(m^2).  Physical Exam  Constitutional:  Temporal wasting. Frail appearing sitting in w/c  HENT:  Mouth/Throat: Oropharynx is clear and moist.  Eyes: Pupils are equal, round, and reactive to light. No scleral icterus.  Neck: Neck supple. Carotid bruit is not present.  Cardiovascular: Normal rate, regular rhythm, normal heart sounds and intact distal pulses.  Exam reveals no gallop and no friction rub.   No murmur heard. Trace LLE edema. No RLE edema. No calf TTP  Pulmonary/Chest: Effort normal and breath sounds normal. He has no wheezes. He has no rales. He exhibits no tenderness.  Abdominal: Soft. Bowel sounds are normal. He exhibits no distension, no abdominal bruit, no pulsatile midline mass and no mass. There is no tenderness. There is no rebound and no  guarding.  Musculoskeletal: He exhibits edema.  Lymphadenopathy:    He has no cervical adenopathy.  Neurological: He is alert.  Skin: Skin is warm and dry. No rash noted.  Psychiatric: He has a normal mood and affect. His behavior is normal. His speech is slurred.     Labs reviewed: No visits with results within 3 Month(s) from this visit. Latest known visit with results is:  Nursing Home on 10/02/2014  Component Date Value Ref Range Status  . Hemoglobin 08/13/2014 12.4* 13.5 - 17.5 g/dL Final  . HCT 08/13/2014 37* 41 - 53 % Final  . Neutrophils Absolute 08/13/2014 3   Final  . Platelets 08/13/2014 332  150 - 399 K/L Final  . WBC 08/13/2014 5.7   Final  . Glucose 08/13/2014 80   Final  . BUN 08/13/2014 14  4 - 21 mg/dL Final  . Creatinine 08/13/2014 0.8  0.6 - 1.3 mg/dL Final  . Potassium 08/13/2014 4.6  3.4 - 5.3 mmol/L Final  . Sodium 08/13/2014 133* 137 - 147 mmol/L Final    No results found.   Assessment/Plan   ICD-9-CM ICD-10-CM   1. Essential hypertension 401.9 I10   2. BPH (benign prostatic hyperplasia) 600.00 N40.0   3. Dysphagia, pharyngoesophageal phase 787.24 R13.14   4. Mentally challenged 319 F79     Check cmp, TSH and lipid panel  Cont current meds as ordered  PT/OT/ST as indicated  Will follow  Jaycub Noorani S. Perlie Gold  [facility] [address] [place], [place] [address] [phone] Cell (Monday-Friday 8 AM - 5 PM) [phone] After 5 PM and follow prompts

## 2015-10-18 DIAGNOSIS — E039 Hypothyroidism, unspecified: Secondary | ICD-10-CM | POA: Diagnosis not present

## 2015-10-18 DIAGNOSIS — E785 Hyperlipidemia, unspecified: Secondary | ICD-10-CM | POA: Diagnosis not present

## 2015-10-18 DIAGNOSIS — I10 Essential (primary) hypertension: Secondary | ICD-10-CM | POA: Diagnosis not present

## 2015-10-18 LAB — BASIC METABOLIC PANEL
BUN: 11 mg/dL (ref 4–21)
Creatinine: 0.7 mg/dL (ref 0.6–1.3)
GLUCOSE: 96 mg/dL
POTASSIUM: 4.6 mmol/L (ref 3.4–5.3)
Sodium: 137 mmol/L (ref 137–147)

## 2015-10-18 LAB — TSH: TSH: 2.8 u[IU]/mL (ref 0.41–5.90)

## 2015-10-18 LAB — LIPID PANEL
Cholesterol: 148 mg/dL (ref 0–200)
HDL: 41 mg/dL (ref 35–70)
LDL CALC: 96 mg/dL
Triglycerides: 55 mg/dL (ref 40–160)

## 2015-11-04 ENCOUNTER — Encounter: Payer: Self-pay | Admitting: Adult Health

## 2015-11-04 NOTE — Progress Notes (Signed)
Patient ID: Jesus Ramsey, male   DOB: [birth year], 81 y.o.   MRN: [id number]    Facility: [facility]      No Known Allergies  Chief Complaint  Patient presents with  . Annual Exam    HPI:  He is a long term resident of this facility being seen for his annual exam. Overall there has no significant change in his status over the past year. He has not been hospitalized. He is unable to fully participate in the hpi or ros; but did tell me that he is feeling good. There are no nursing concerns at this time.    Past Medical History  Diagnosis Date  . Mental retardation   . Hypertension   . Seizures ([place])   . Pneumonia   . Psychogenic polydipsia   . B12 deficiency anemia   . Cataract   . Altered mental status   . BPH (benign prostatic hypertrophy)   . Chronic hyponatremia     [name] 01/13/2012  . B12 deficiency 01/14/2012    Past Surgical History  Procedure Laterality Date  . No past surgeries      VITAL SIGNS BP 113/66 mmHg  Pulse 79  Ht 5\' 8"  (1.727 m)  Wt 162 lb (73.483 kg)  BMI 24.64 kg/m2  Patient's Medications  New Prescriptions   No medications on file  Previous Medications   ACETAMINOPHEN (TYLENOL) 325 MG TABLET    Take 650 mg by mouth every 6 (six) hours as needed. pain   ASPIRIN 81 MG CHEWABLE TABLET    Chew 81 mg by mouth daily.   MAGNESIUM HYDROXIDE (MILK OF MAGNESIA) 400 MG/5ML SUSPENSION    Take 30 mLs by mouth daily as needed. For constipation   METOPROLOL (LOPRESSOR) 50 MG TABLET    Take 75 mg by mouth 2 (two) times daily.   SODIUM CHLORIDE 1 G TABLET    Take 1 g by mouth every morning.   TAMSULOSIN HCL (FLOMAX) 0.4 MG CAPS    Take 0.4 mg by mouth daily.   Modified Medications   No medications on file  Discontinued Medications     SIGNIFICANT DIAGNOSTIC EXAMS   09-17-14: chest x-ray: no active disease seen in chest   LABS REVIEWED;   10-05-14: glucose 75; bun 13; creat 0.69; k+4.0; na++ 140  03-13-15: wbc 6.7; hgb 12.9; hct 39.1; mcv 83.2 plt  311; glucose 90; bun 14; creat 0.65; k+4.4; na++136; liver normal albumin 3.2     Review of Systems Unable to perform ROS: Other      Physical Exam Constitutional: No distress.  Eyes: Conjunctivae are normal.  Neck: Neck supple. No JVD present. No thyromegaly present.  Cardiovascular: Normal rate, regular rhythm and intact distal pulses.   Respiratory: Effort normal and breath sounds normal. No respiratory distress. He has no wheezes.  GI: Soft. Bowel sounds are normal. He exhibits no distension. There is no tenderness.  Musculoskeletal:  Able to move all extremities Has 1+ bilateral lower extremity edema    Lymphadenopathy:    He has no cervical adenopathy.  Neurological: He is alert.  Skin: Skin is warm and dry. He is not diaphoretic.  Psychiatric: He has a normal mood and affect     ASSESSMENT/ PLAN:  1.  Hypertension: is stable will continue lopressor 75 mg twice daily and will monitor  2.  Dysphagia: no signs of aspiration present; is on thin liquids  will monitor his status.   3. Hyponatremia, is chronic: is on nacl 1  gm daily with a na++ of 136; will not make changes will monitor  4. BPH: will continue flomax 0.4 mg  daily  5. Seizure: no reports of seizure activity present. Is presently not on medications; will continue to monitor his status.   6. Anemia: is stable hgb is 12.9 will monitor  7. MR: no change in status    Time spent with patient 40   minutes >50% time spent counseling; reviewing medical record; tests; labs; and developing future plan of care      [name] NP [facility] Adult Medicine  Contact [phone] Monday through Friday 8am- 5pm  After hours call [phone]

## 2015-11-15 ENCOUNTER — Non-Acute Institutional Stay (SKILLED_NURSING_FACILITY): Payer: Medicare Other | Admitting: Adult Health

## 2015-11-15 DIAGNOSIS — I10 Essential (primary) hypertension: Secondary | ICD-10-CM

## 2015-11-15 DIAGNOSIS — D649 Anemia, unspecified: Secondary | ICD-10-CM

## 2015-11-15 DIAGNOSIS — N400 Enlarged prostate without lower urinary tract symptoms: Secondary | ICD-10-CM | POA: Diagnosis not present

## 2015-11-15 DIAGNOSIS — R631 Polydipsia: Secondary | ICD-10-CM | POA: Diagnosis not present

## 2015-11-15 DIAGNOSIS — R569 Unspecified convulsions: Secondary | ICD-10-CM | POA: Diagnosis not present

## 2015-11-15 DIAGNOSIS — E871 Hypo-osmolality and hyponatremia: Secondary | ICD-10-CM

## 2015-11-15 DIAGNOSIS — F54 Psychological and behavioral factors associated with disorders or diseases classified elsewhere: Secondary | ICD-10-CM

## 2015-12-19 ENCOUNTER — Non-Acute Institutional Stay (SKILLED_NURSING_FACILITY): Payer: Medicare Other | Admitting: Adult Health

## 2015-12-19 DIAGNOSIS — E871 Hypo-osmolality and hyponatremia: Secondary | ICD-10-CM | POA: Diagnosis not present

## 2015-12-19 DIAGNOSIS — R569 Unspecified convulsions: Secondary | ICD-10-CM

## 2015-12-19 DIAGNOSIS — I10 Essential (primary) hypertension: Secondary | ICD-10-CM

## 2015-12-19 DIAGNOSIS — R1314 Dysphagia, pharyngoesophageal phase: Secondary | ICD-10-CM

## 2015-12-19 DIAGNOSIS — D649 Anemia, unspecified: Secondary | ICD-10-CM

## 2015-12-19 DIAGNOSIS — N400 Enlarged prostate without lower urinary tract symptoms: Secondary | ICD-10-CM

## 2015-12-19 DIAGNOSIS — R631 Polydipsia: Secondary | ICD-10-CM

## 2015-12-19 DIAGNOSIS — F54 Psychological and behavioral factors associated with disorders or diseases classified elsewhere: Secondary | ICD-10-CM | POA: Diagnosis not present

## 2016-01-02 DIAGNOSIS — M79672 Pain in left foot: Secondary | ICD-10-CM | POA: Diagnosis not present

## 2016-01-02 DIAGNOSIS — M79671 Pain in right foot: Secondary | ICD-10-CM | POA: Diagnosis not present

## 2016-01-02 DIAGNOSIS — I2510 Atherosclerotic heart disease of native coronary artery without angina pectoris: Secondary | ICD-10-CM | POA: Diagnosis not present

## 2016-01-02 DIAGNOSIS — B351 Tinea unguium: Secondary | ICD-10-CM | POA: Diagnosis not present

## 2016-01-02 LAB — HM DIABETES FOOT EXAM

## 2016-01-05 NOTE — Progress Notes (Signed)
Patient ID: Jesus Ramsey, male   DOB: June 23, 1931, 82 y.o.   MRN: [id number]   Facility: [facility]       No Known Allergies  Chief Complaint  Patient presents with  . Medical Management of Chronic Issues    HPI:  He is a long term resident of this facility being seen for the management of his chronic illnesses. Overall his status remains stable. He is unable to fully participate in the hpi; but he tells me that he is feeling good. There are no nursing concerns at this time.    Past Medical History  Diagnosis Date  . Mental retardation   . Hypertension   . Seizures ([place])   . Pneumonia   . Psychogenic polydipsia   . B12 deficiency anemia   . Cataract   . Altered mental status   . BPH (benign prostatic hypertrophy)   . Chronic hyponatremia     [name] 01/13/2012  . B12 deficiency 01/14/2012    Past Surgical History  Procedure Laterality Date  . No past surgeries      VITAL SIGNS There were no vitals taken for this visit.  Patient's Medications  New Prescriptions   No medications on file  Previous Medications   ACETAMINOPHEN (TYLENOL) 325 MG TABLET    Take 650 mg by mouth every 6 (six) hours as needed. pain   ASPIRIN 81 MG CHEWABLE TABLET    Chew 81 mg by mouth daily.   MAGNESIUM HYDROXIDE (MILK OF MAGNESIA) 400 MG/5ML SUSPENSION    Take 30 mLs by mouth daily as needed. For constipation   METOPROLOL (LOPRESSOR) 50 MG TABLET    Take 75 mg by mouth 2 (two) times daily.   SODIUM CHLORIDE 1 G TABLET    Take 1 g by mouth every morning.   TAMSULOSIN HCL (FLOMAX) 0.4 MG CAPS    Take 0.4 mg by mouth daily.   Modified Medications   No medications on file  Discontinued Medications   No medications on file     SIGNIFICANT DIAGNOSTIC EXAMS  09-17-14: chest x-ray: no active disease seen in chest   LABS REVIEWED;   03-13-15: wbc 6.7; hgb 12.9; hct 39.1; mcv 83.2 plt 311; glucose 90; bun 14; creat 0.65; k+4.4; na++136; liver normal albumin 3.2  10-18-15: glucose 96; bun 11;  creat 0.69; k+ 4.6; na++137; liver normal albumin 3.2; chol 148; ldl 96; trig 55; hdl 41; tsh 2.801    Review of Systems Unable to perform ROS: Other      Physical Exam Constitutional: No distress.  Eyes: Conjunctivae are normal.  Neck: Neck supple. No JVD present. No thyromegaly present.  Cardiovascular: Normal rate, regular rhythm and intact distal pulses.   Respiratory: Effort normal and breath sounds normal. No respiratory distress. He has no wheezes.  GI: Soft. Bowel sounds are normal. He exhibits no distension. There is no tenderness.  Musculoskeletal:  Able to move all extremities Has 1+ bilateral lower extremity edema    Lymphadenopathy:    He has no cervical adenopathy.  Neurological: He is alert.  Skin: Skin is warm and dry. He is not diaphoretic.  Psychiatric: He has a normal mood and affect     ASSESSMENT/ PLAN:  1.  Hypertension: is stable will continue lopressor 75 mg twice daily and will monitor  2.  Dysphagia: no signs of aspiration present; is on thin liquids  will monitor his status.   3. Hyponatremia: has psychogenic polydipsia:  is chronic: is on nacl 1 gm  daily with a na++ of 136; will not make changes will monitor  4. BPH: will continue flomax 0.4 mg  daily  5. Seizure: no reports of seizure activity present. Is presently not on medications; will continue to monitor his status.   6. Anemia: is stable hgb is 12.9 will monitor            [name] NP [facility]  Contact [phone] Monday through Friday 8am- 5pm  After hours call [phone]

## 2016-01-20 ENCOUNTER — Encounter: Payer: Self-pay | Admitting: Adult Health

## 2016-01-20 ENCOUNTER — Non-Acute Institutional Stay (SKILLED_NURSING_FACILITY): Payer: Medicare Other | Admitting: Adult Health

## 2016-01-20 DIAGNOSIS — I10 Essential (primary) hypertension: Secondary | ICD-10-CM

## 2016-01-20 DIAGNOSIS — R569 Unspecified convulsions: Secondary | ICD-10-CM

## 2016-01-20 DIAGNOSIS — N400 Enlarged prostate without lower urinary tract symptoms: Secondary | ICD-10-CM | POA: Diagnosis not present

## 2016-01-20 DIAGNOSIS — E871 Hypo-osmolality and hyponatremia: Secondary | ICD-10-CM | POA: Diagnosis not present

## 2016-01-20 DIAGNOSIS — R1314 Dysphagia, pharyngoesophageal phase: Secondary | ICD-10-CM

## 2016-01-20 DIAGNOSIS — F54 Psychological and behavioral factors associated with disorders or diseases classified elsewhere: Secondary | ICD-10-CM | POA: Diagnosis not present

## 2016-01-20 DIAGNOSIS — R631 Polydipsia: Secondary | ICD-10-CM

## 2016-01-20 NOTE — Progress Notes (Signed)
Patient ID: Jesus Ramsey, male   DOB: 01/30/1931, 82 y.o.   MRN: [id number]   Facility[facility]       No Known Allergies  Chief Complaint  Patient presents with  . Medical Management of Chronic Issues    Follow up    HPI:  He is a long term resident of this facility being seen for the management of his chronic illnesses. Overall there is little change in his status. He can fully participate in the hpi or ros; he tells me that he is feeling good. There are no nursing concerns at this time.    Past Medical History  Diagnosis Date  . Mental retardation   . Hypertension   . Seizures ([place])   . Pneumonia   . Psychogenic polydipsia   . B12 deficiency anemia   . Cataract   . Altered mental status   . BPH (benign prostatic hypertrophy)   . Chronic hyponatremia     [name] 01/13/2012  . B12 deficiency 01/14/2012    Past Surgical History  Procedure Laterality Date  . No past surgeries      VITAL SIGNS BP 136/58 mmHg  Pulse 78  Temp(Src) 97.9 F (36.6 C) (Oral)  Resp 18  Ht 5\' 5"  (1.651 m)  Wt 158 lb 2 oz (71.725 kg)  BMI 26.31 kg/m2  Patient's Medications  New Prescriptions   No medications on file  Previous Medications   ACETAMINOPHEN (TYLENOL) 325 MG TABLET    Take 650 mg by mouth every 6 (six) hours as needed. pain   ASPIRIN 81 MG CHEWABLE TABLET    Chew 81 mg by mouth daily.   MAGNESIUM HYDROXIDE (MILK OF MAGNESIA) 400 MG/5ML SUSPENSION    Take 30 mLs by mouth daily as needed. For constipation   METOPROLOL (LOPRESSOR) 50 MG TABLET    Take 75 mg by mouth 2 (two) times daily.   OSELTAMIVIR (TAMIFLU) 75 MG CAPSULE    Take 75 mg by mouth daily. X 14 days until 02/01/16   SODIUM CHLORIDE 1 G TABLET    Take 1 g by mouth every morning.   TAMSULOSIN HCL (FLOMAX) 0.4 MG CAPS    Take 0.4 mg by mouth daily.   Modified Medications   No medications on file  Discontinued Medications   No medications on file     SIGNIFICANT DIAGNOSTIC EXAMS  09-17-14: chest x-ray: no  active disease seen in chest   LABS REVIEWED;   03-13-15: wbc 6.7; hgb 12.9; hct 39.1; mcv 83.2 plt 311; glucose 90; bun 14; creat 0.65; k+4.4; na++136; liver normal albumin 3.2  10-18-15: glucose 96; bun 11; creat 0.69; k+ 4.6; na++137; liver normal albumin 3.2; chol 148; ldl 96; trig 55; hdl 41; tsh 2.801    Review of Systems Unable to perform ROS: Other      Physical Exam Constitutional: No distress.  Eyes: Conjunctivae are normal.  Neck: Neck supple. No JVD present. No thyromegaly present.  Cardiovascular: Normal rate, regular rhythm and intact distal pulses.   Respiratory: Effort normal and breath sounds normal. No respiratory distress. He has no wheezes.  GI: Soft. Bowel sounds are normal. He exhibits no distension. There is no tenderness.  Musculoskeletal:  Able to move all extremities Has 1+ bilateral lower extremity edema    Lymphadenopathy:    He has no cervical adenopathy.  Neurological: He is alert.  Skin: Skin is warm and dry. He is not diaphoretic.  Psychiatric: He has a normal mood and affect  ASSESSMENT/ PLAN:  1.  Hypertension: is stable will continue lopressor 75 mg twice daily and will monitor  2.  Dysphagia: no signs of aspiration present; is on thin liquids  will monitor his status.   3. Hyponatremia: has psychogenic polydipsia:  is chronic: is on nacl 1 gm daily with a na++ of 136; will not make changes will monitor  4. BPH: will continue flomax 0.4 mg  daily  5. Seizure: no reports of seizure activity present. Is presently not on medications; will continue to monitor his status.   6. Anemia: is stable hgb is 12.9 will monitor           [name] NP [facility]  Contact [phone] Monday through Friday 8am- 5pm  After hours call [phone]

## 2016-01-22 ENCOUNTER — Encounter: Payer: Self-pay | Admitting: Adult Health

## 2016-01-22 NOTE — Progress Notes (Signed)
Patient ID: Jesus Ramsey, male   DOB: 02/12/1931, 82 y.o.   MRN: [id number]   Facility[facility]       No Known Allergies  Chief Complaint  Patient presents with  . Medical Management of Chronic Issues    HPI:  He is a long term resident of this facility being seen for the management of his chronic illnesses. Overall his status is stable. He is unable to fully participate in the hpi or ros; but tells me that he is feeling good. There are no nursing concerns at this time.    Past Medical History  Diagnosis Date  . Mental retardation   . Hypertension   . Seizures ([place])   . Pneumonia   . Psychogenic polydipsia   . B12 deficiency anemia   . Cataract   . Altered mental status   . BPH (benign prostatic hypertrophy)   . Chronic hyponatremia     [name] 01/13/2012  . B12 deficiency 01/14/2012    Past Surgical History  Procedure Laterality Date  . No past surgeries      VITAL SIGNS BP 107/62 mmHg  Pulse 72  Temp(Src) 98.4 F (36.9 C)  Ht 5\' 8"  (1.727 m)  Wt 158 lb 12.8 oz (72.031 kg)  BMI 24.15 kg/m2  SpO2 94%  Patient's Medications  New Prescriptions   No medications on file  Previous Medications   ACETAMINOPHEN (TYLENOL) 325 MG TABLET    Take 650 mg by mouth every 6 (six) hours as needed. pain   ASPIRIN 81 MG CHEWABLE TABLET    Chew 81 mg by mouth daily.   MAGNESIUM HYDROXIDE (MILK OF MAGNESIA) 400 MG/5ML SUSPENSION    Take 30 mLs by mouth daily as needed. For constipation   METOPROLOL (LOPRESSOR) 50 MG TABLET    Take 75 mg by mouth 2 (two) times daily.   OSELTAMIVIR (TAMIFLU) 75 MG CAPSULE    Take 75 mg by mouth daily. X 14 days until 02/01/16   SODIUM CHLORIDE 1 G TABLET    Take 1 g by mouth every morning.   TAMSULOSIN HCL (FLOMAX) 0.4 MG CAPS    Take 0.4 mg by mouth daily.   Modified Medications   No medications on file  Discontinued Medications   No medications on file     SIGNIFICANT DIAGNOSTIC EXAMS  09-17-14: chest x-ray: no active disease seen in  chest   LABS REVIEWED;   03-13-15: wbc 6.7; hgb 12.9; hct 39.1; mcv 83.2 plt 311; glucose 90; bun 14; creat 0.65; k+4.4; na++136; liver normal albumin 3.2  10-18-15: glucose 96; bun 11; creat 0.69; k+ 4.6; na++137; liver normal albumin 3.2; chol 148; ldl 96; trig 55; hdl 41; tsh 2.801    Review of Systems Unable to perform ROS: Other      Physical Exam Constitutional: No distress.  Eyes: Conjunctivae are normal.  Neck: Neck supple. No JVD present. No thyromegaly present.  Cardiovascular: Normal rate, regular rhythm and intact distal pulses.   Respiratory: Effort normal and breath sounds normal. No respiratory distress. He has no wheezes.  GI: Soft. Bowel sounds are normal. He exhibits no distension. There is no tenderness.  Musculoskeletal:  Able to move all extremities Has 1+ bilateral lower extremity edema    Lymphadenopathy:    He has no cervical adenopathy.  Neurological: He is alert.  Skin: Skin is warm and dry. He is not diaphoretic.  Psychiatric: He has a normal mood and affect     ASSESSMENT/ PLAN:  1.  Hypertension: is stable will continue lopressor 75 mg twice daily and will monitor  2.  Dysphagia: no signs of aspiration present; is on thin liquids  will monitor his status.   3. Hyponatremia: has psychogenic polydipsia:  is chronic: is on nacl 1 gm daily with a na++ of 136; will not make changes will monitor  4. BPH: will continue flomax 0.4 mg  daily  5. Seizure: no reports of seizure activity present. Is presently not on medications; will continue to monitor his status.   6. Anemia: is stable hgb is 12.9 will monitor        [name] NP [facility]  Contact [phone] Monday through Friday 8am- 5pm  After hours call [phone]

## 2016-02-10 DIAGNOSIS — H40033 Anatomical narrow angle, bilateral: Secondary | ICD-10-CM | POA: Diagnosis not present

## 2016-02-10 DIAGNOSIS — H25813 Combined forms of age-related cataract, bilateral: Secondary | ICD-10-CM | POA: Diagnosis not present

## 2016-02-10 LAB — HM DIABETES EYE EXAM

## 2016-02-19 ENCOUNTER — Non-Acute Institutional Stay (SKILLED_NURSING_FACILITY): Payer: Medicare Other | Admitting: Adult Health

## 2016-02-19 ENCOUNTER — Encounter: Payer: Self-pay | Admitting: Adult Health

## 2016-02-19 DIAGNOSIS — I10 Essential (primary) hypertension: Secondary | ICD-10-CM | POA: Diagnosis not present

## 2016-02-19 DIAGNOSIS — F54 Psychological and behavioral factors associated with disorders or diseases classified elsewhere: Secondary | ICD-10-CM | POA: Diagnosis not present

## 2016-02-19 DIAGNOSIS — R1314 Dysphagia, pharyngoesophageal phase: Secondary | ICD-10-CM

## 2016-02-19 DIAGNOSIS — E871 Hypo-osmolality and hyponatremia: Secondary | ICD-10-CM | POA: Diagnosis not present

## 2016-02-19 DIAGNOSIS — R569 Unspecified convulsions: Secondary | ICD-10-CM

## 2016-02-19 DIAGNOSIS — N400 Enlarged prostate without lower urinary tract symptoms: Secondary | ICD-10-CM

## 2016-02-19 DIAGNOSIS — R631 Polydipsia: Secondary | ICD-10-CM

## 2016-02-19 NOTE — Progress Notes (Signed)
Patient ID: Jesus Ramsey, male   DOB: 10-10-1931, 82 y.o.   MRN: [id number]   Facility[facility]       No Known Allergies  Chief Complaint  Patient presents with  . Medical Management of Chronic Issues    Follow up    HPI:  He is a long term resident of this facility being seen for the management of his chronic illnesses. Overall there is little change in his status. He cannot fully participate in the hpi or ros; but he tells me that he is feeling good. There are no nursing concerns at this time.    Past Medical History  Diagnosis Date  . Mental retardation   . Hypertension   . Seizures ([place])   . Pneumonia   . Psychogenic polydipsia   . B12 deficiency anemia   . Cataract   . Altered mental status   . BPH (benign prostatic hypertrophy)   . Chronic hyponatremia     [name] 01/13/2012  . B12 deficiency 01/14/2012    Past Surgical History  Procedure Laterality Date  . No past surgeries      VITAL SIGNS BP 128/60 mmHg  Pulse 70  Temp(Src) 97.5 F (36.4 C) (Oral)  Resp 18  Ht 5\' 5"  (1.651 m)  Wt 159 lb 6 oz (72.292 kg)  BMI 26.52 kg/m2  SpO2 97%  Patient's Medications  New Prescriptions   No medications on file  Previous Medications   ACETAMINOPHEN (TYLENOL) 325 MG TABLET    Take 650 mg by mouth every 6 (six) hours as needed. pain   ASPIRIN 81 MG CHEWABLE TABLET    Chew 81 mg by mouth daily.   MAGNESIUM HYDROXIDE (MILK OF MAGNESIA) 400 MG/5ML SUSPENSION    Take 30 mLs by mouth daily as needed. For constipation   METOPROLOL (LOPRESSOR) 50 MG TABLET    Take 75 mg by mouth 2 (two) times daily.   OSELTAMIVIR (TAMIFLU) 75 MG CAPSULE    Take 75 mg by mouth daily. Reported on 02/19/2016   SODIUM CHLORIDE 1 G TABLET    Take 1 g by mouth every morning.   TAMSULOSIN HCL (FLOMAX) 0.4 MG CAPS    Take 0.4 mg by mouth daily.   Modified Medications   No medications on file  Discontinued Medications   No medications on file     SIGNIFICANT DIAGNOSTIC EXAMS  09-17-14:  chest x-ray: no active disease seen in chest   LABS REVIEWED;   03-13-15: wbc 6.7; hgb 12.9; hct 39.1; mcv 83.2 plt 311; glucose 90; bun 14; creat 0.65; k+4.4; na++136; liver normal albumin 3.2  10-18-15: glucose 96; bun 11; creat 0.69; k+ 4.6; na++137; liver normal albumin 3.2; chol 148; ldl 96; trig 55; hdl 41; tsh 2.801    Review of Systems Unable to perform ROS: Other      Physical Exam Constitutional: No distress.  Eyes: Conjunctivae are normal.  Neck: Neck supple. No JVD present. No thyromegaly present.  Cardiovascular: Normal rate, regular rhythm and intact distal pulses.   Respiratory: Effort normal and breath sounds normal. No respiratory distress. He has no wheezes.  GI: Soft. Bowel sounds are normal. He exhibits no distension. There is no tenderness.  Musculoskeletal:  Able to move all extremities Has trace bilateral lower extremity edema    Lymphadenopathy:    He has no cervical adenopathy.  Neurological: He is alert.  Skin: Skin is warm and dry. He is not diaphoretic.  Psychiatric: He has a normal mood and  affect     ASSESSMENT/ PLAN:  1.  Hypertension: is stable will continue lopressor 75 mg twice daily and will monitor  2.  Dysphagia: no signs of aspiration present; is on thin liquids  will monitor his status.   3. Hyponatremia: has psychogenic polydipsia:  is chronic: is on nacl 1 gm daily with a na++ of 137; will not make changes will monitor  4. BPH: will continue flomax 0.4 mg  daily  5. Seizure: no reports of seizure activity present. Is presently not on medications; will continue to monitor his status.   6. Anemia: is stable hgb is 12.9 will monitor       [name] NP [facility]  Contact [phone] Monday through Friday 8am- 5pm  After hours call [phone]

## 2016-03-17 ENCOUNTER — Non-Acute Institutional Stay (SKILLED_NURSING_FACILITY): Payer: Medicare Other | Admitting: Internal Medicine

## 2016-03-17 ENCOUNTER — Encounter: Payer: Self-pay | Admitting: Internal Medicine

## 2016-03-17 DIAGNOSIS — R631 Polydipsia: Secondary | ICD-10-CM | POA: Diagnosis not present

## 2016-03-17 DIAGNOSIS — R569 Unspecified convulsions: Secondary | ICD-10-CM | POA: Diagnosis not present

## 2016-03-17 DIAGNOSIS — D649 Anemia, unspecified: Secondary | ICD-10-CM

## 2016-03-17 DIAGNOSIS — E871 Hypo-osmolality and hyponatremia: Secondary | ICD-10-CM | POA: Diagnosis not present

## 2016-03-17 DIAGNOSIS — N400 Enlarged prostate without lower urinary tract symptoms: Secondary | ICD-10-CM

## 2016-03-17 DIAGNOSIS — F79 Unspecified intellectual disabilities: Secondary | ICD-10-CM | POA: Diagnosis not present

## 2016-03-17 DIAGNOSIS — I10 Essential (primary) hypertension: Secondary | ICD-10-CM

## 2016-03-17 DIAGNOSIS — F54 Psychological and behavioral factors associated with disorders or diseases classified elsewhere: Secondary | ICD-10-CM | POA: Diagnosis not present

## 2016-03-17 NOTE — Progress Notes (Signed)
DATE:  Mar 17, 2016  Location:  [facility] Room Number: 143-A Place of Service: SNF [phone])   Extended Emergency Contact Information Primary Emergency Contact: [name],[name] Address: [address]          [place] [address] [name] Phone: [phone] Relation: Brother  Advanced Directive information  FULL CODE  Chief Complaint  Patient presents with  . Medical Management of Chronic Issues    Routine Visit    HPI:  82 yo Ramsey long term resident seen today for f/u. He has no c/o. He is a poor historian due to mental status/psych d/o. Hx obtained from chart. No nursing issues. No falls. No witnessed sz  Hypertension - BP stable on lopressor 75 mg twice daily  Dysphagia - no signs of aspiration present; is on thin liquids  Hyponatremia with psychogenic polydipsia - chronic. Takes NaCl 1 gm daily. Na 137  BPH - sx's stable on flomax 0.4 mg  daily  Seizure - stable without medications  Anemia - stable with hgb 12.9   Past Medical History  Diagnosis Date  . Mental retardation   . Hypertension   . Seizures ([place])   . Pneumonia   . Psychogenic polydipsia   . B12 deficiency anemia   . Cataract   . Altered mental status   . BPH (benign prostatic hypertrophy)   . Chronic hyponatremia     [name] 01/13/2012  . B12 deficiency 01/14/2012    Past Surgical History  Procedure Laterality Date  . No past surgeries      Patient Care Team: [name], DO as PCP - General (Internal Medicine) [name], NP as Nurse Practitioner (Nurse Practitioner) [place] ([place])  Social History   Social History  . Marital Status: Single    Spouse Name: N/A  . Number of Children: N/A  . Years of Education: N/A   Occupational History  . Not on file.   Social History Main Topics  . Smoking status: Former Research scientist (life sciences)  . Smokeless tobacco: Not on file  . Alcohol Use: No  . Drug  Use: No  . Sexual Activity: Not on file   Other Topics Concern  . Not on file   Social History Narrative     reports that he has quit smoking. He does not have any smokeless tobacco history on file. He reports that he does not drink alcohol or use illicit drugs.  Immunization History  Administered Date(s) Administered  . Influenza-Unspecified 08/22/2014    No Known Allergies  Medications: Patient's Medications  New Prescriptions   No medications on file  Previous Medications   ACETAMINOPHEN (TYLENOL) 325 MG TABLET    Take 650 mg by mouth every 6 (six) hours as needed. pain   ASPIRIN 81 MG CHEWABLE TABLET    Chew 81 mg by mouth daily.   MAGNESIUM HYDROXIDE (MILK OF MAGNESIA) 400 MG/5ML SUSPENSION    Take 30 mLs by mouth daily as needed. For constipation   METOPROLOL (LOPRESSOR) 50 MG TABLET    Take 75 mg by mouth 2 (two) times daily.   SODIUM CHLORIDE 1 G TABLET    Take 1 g by mouth every morning.   TAMSULOSIN HCL (FLOMAX) 0.4 MG CAPS    Take 0.4 mg by mouth daily.   Modified Medications   No medications on file  Discontinued Medications   OSELTAMIVIR (TAMIFLU) 75 MG CAPSULE    Take  75 mg by mouth daily. Reported on 02/19/2016    Review of Systems  Unable to perform ROS: Psychiatric disorder    Filed Vitals:   03/17/16 1151  BP: 138/78  Pulse: 68  Temp: 97.6 F (36.4 C)  TempSrc: Oral  Resp: 18  Height: 5\' 5"  (1.651 m)  Weight: 159 lb 3.2 oz (72.213 kg)  SpO2: 96%   Body mass index is 26.49 kg/(m^2).  Physical Exam  Constitutional: He appears well-developed.  Frail appearing, sitting in w/c in NAD, confused  HENT:  Mouth/Throat: Oropharynx is clear and moist.  Eyes: Pupils are equal, round, and reactive to light. No scleral icterus.  Neck: Neck supple. Carotid bruit is not present. No thyromegaly present.  Cardiovascular: Normal rate, regular rhythm, normal heart sounds and intact distal pulses.  Exam reveals no gallop and no friction rub.   No murmur  heard. no distal LE swelling. No calf TTP  Pulmonary/Chest: Effort normal and breath sounds normal. He has no wheezes. He has no rales. He exhibits no tenderness.  Abdominal: Soft. Bowel sounds are normal. He exhibits no distension, no abdominal bruit, no pulsatile midline mass and no mass. There is no tenderness. There is no rebound and no guarding.  Musculoskeletal: He exhibits edema (small, intermdte and large joint swelling).  R>L ankle/foot swelling; small large joint deformities  Lymphadenopathy:    He has no cervical adenopathy.  Neurological: He is alert.  Skin: Skin is warm and dry. No rash noted.  Psychiatric: He has a normal mood and affect. His behavior is normal.     Labs reviewed: Nursing Home on 02/19/2016  Component Date Value Ref Range Status  . HM Diabetic Eye Exam 02/10/2016 No Retinopathy  No Retinopathy Final  . HM Diabetic Foot Exam 01/02/2016 Completed   Final  Nursing Home on 01/20/2016  Component Date Value Ref Range Status  . Glucose 10/18/2015 96   Final  . BUN 10/18/2015 11  4 - 21 mg/dL Final  . Creatinine 10/18/2015 0.7  0.6 - 1.3 mg/dL Final  . Potassium 10/18/2015 4.6  3.4 - 5.3 mmol/L Final  . Sodium 10/18/2015 137  137 - 147 mmol/L Final  . Triglycerides 10/18/2015 55  Jesus - 160 mg/dL Final  . Cholesterol 10/18/2015 148  0 - 200 mg/dL Final  . HDL 10/18/2015 41  35 - 70 mg/dL Final  . LDL Cholesterol 10/18/2015 96   Final  . TSH 10/18/2015 2.80  0.41 - 5.90 uIU/mL Final    No results found.   Assessment/Plan   ICD-9-CM ICD-10-CM   1. Essential hypertension 401.9 I10   2. Seizures (HCC) 780.39 R56.9   3. Hyponatremia 276.1 E87.1   4. Psychogenic polydipsia 316 F54    783.5 R63.1   5. Anemia, unspecified anemia type 285.9 D64.9   6. BPH (benign prostatic hyperplasia) 600.00 N40.0   7. Mentally challenged 55 F79    Pt is medically stable on current tx plan. Continue current medications as ordered. PT/OT/ST as indicated. Will  follow  Tu Bayle S. Perlie Gold  [facility] [address] [place], [place] [address] [phone] Cell (Monday-Friday 8 AM - 5 PM) [phone] After 5 PM and follow prompts

## 2016-03-24 DIAGNOSIS — L84 Corns and callosities: Secondary | ICD-10-CM | POA: Diagnosis not present

## 2016-03-24 DIAGNOSIS — B353 Tinea pedis: Secondary | ICD-10-CM | POA: Diagnosis not present

## 2016-03-24 DIAGNOSIS — B351 Tinea unguium: Secondary | ICD-10-CM | POA: Diagnosis not present

## 2016-03-24 DIAGNOSIS — I70203 Unspecified atherosclerosis of native arteries of extremities, bilateral legs: Secondary | ICD-10-CM | POA: Diagnosis not present

## 2016-04-16 ENCOUNTER — Non-Acute Institutional Stay (SKILLED_NURSING_FACILITY): Payer: Medicare Other | Admitting: Adult Health

## 2016-04-16 ENCOUNTER — Encounter: Payer: Self-pay | Admitting: Adult Health

## 2016-04-16 DIAGNOSIS — D649 Anemia, unspecified: Secondary | ICD-10-CM

## 2016-04-16 DIAGNOSIS — R569 Unspecified convulsions: Secondary | ICD-10-CM | POA: Diagnosis not present

## 2016-04-16 DIAGNOSIS — R631 Polydipsia: Secondary | ICD-10-CM

## 2016-04-16 DIAGNOSIS — N400 Enlarged prostate without lower urinary tract symptoms: Secondary | ICD-10-CM | POA: Diagnosis not present

## 2016-04-16 DIAGNOSIS — E871 Hypo-osmolality and hyponatremia: Secondary | ICD-10-CM | POA: Diagnosis not present

## 2016-04-16 DIAGNOSIS — F54 Psychological and behavioral factors associated with disorders or diseases classified elsewhere: Secondary | ICD-10-CM

## 2016-04-16 DIAGNOSIS — R1314 Dysphagia, pharyngoesophageal phase: Secondary | ICD-10-CM

## 2016-04-16 DIAGNOSIS — I10 Essential (primary) hypertension: Secondary | ICD-10-CM

## 2016-04-16 NOTE — Progress Notes (Signed)
Location:  [facility] Room Number: 143-A Place of Service:  SNF (31)   CODE STATUS: full code  No Known Allergies  Chief Complaint  Patient presents with  . Medical Management of Chronic Issues    HPI:  He is a long term resident of this facility being seen for the management of his chronic illnesses. Overall there is little change in his status. He is unable to fully participate in the hpi or ros. He does engage with others; he will sit out in the lobby area daily.  His weight is stable at 161 pounds. There are no nursing concerns at this time.    Past Medical History  Diagnosis Date  . Mental retardation   . Hypertension   . Seizures ([place])   . Pneumonia   . Psychogenic polydipsia   . B12 deficiency anemia   . Cataract   . Altered mental status   . BPH (benign prostatic hypertrophy)   . Chronic hyponatremia     [name] 01/13/2012  . B12 deficiency 01/14/2012    Past Surgical History  Procedure Laterality Date  . No past surgeries      Social History   Social History  . Marital Status: Single    Spouse Name: N/A  . Number of Children: N/A  . Years of Education: N/A   Occupational History  . Not on file.   Social History Main Topics  . Smoking status: Former Research scientist (life sciences)  . Smokeless tobacco: Not on file  . Alcohol Use: No  . Drug Use: No  . Sexual Activity: Not on file   Other Topics Concern  . Not on file   Social History Narrative   History reviewed. No pertinent family history.    VITAL SIGNS BP 146/74 mmHg  Pulse 84  Temp(Src) 97.6 F (36.4 C)  Resp 18  Ht 5\' 5"  (1.651 m)  Wt 161 lb 6.4 oz (73.211 kg)  BMI 26.86 kg/m2  SpO2 97%  Patient's Medications  New Prescriptions   No medications on file  Previous Medications   ACETAMINOPHEN (TYLENOL) 325 MG TABLET    Take 650 mg by mouth every 6 (six) hours as needed. pain   ASPIRIN 81 MG CHEWABLE TABLET    Chew 81 mg by mouth daily.   MAGNESIUM HYDROXIDE (MILK  OF MAGNESIA) 400 MG/5ML SUSPENSION    Take 30 mLs by mouth daily as needed. For constipation   METOPROLOL (LOPRESSOR) 50 MG TABLET    Take 75 mg by mouth 2 (two) times daily.   SODIUM CHLORIDE 1 G TABLET    Take 1 g by mouth every morning.   TAMSULOSIN HCL (FLOMAX) 0.4 MG CAPS    Take 0.4 mg by mouth daily.   Modified Medications   No medications on file  Discontinued Medications   No medications on file     SIGNIFICANT DIAGNOSTIC EXAMS    09-17-14: chest x-ray: no active disease seen in chest   LABS REVIEWED;   03-13-15: wbc 6.7; hgb 12.9; hct 39.1; mcv 83.2 plt 311; glucose 90; bun 14; creat 0.65; k+4.4; na++136; liver normal albumin 3.2  10-18-15: glucose 96; bun 11; creat 0.69; k+ 4.6; na++137; liver normal albumin 3.2; chol 148; ldl 96; trig 55; hdl 41; tsh 2.801    Review of Systems Unable to perform ROS: Other      Physical Exam Constitutional: No distress. frail  Eyes: Conjunctivae are normal.  Neck: Neck supple. No JVD present. No thyromegaly  present.  Cardiovascular: Normal rate, regular rhythm and intact distal pulses.   Respiratory: Effort normal and breath sounds normal. No respiratory distress. He has no wheezes.  GI: Soft. Bowel sounds are normal. He exhibits no distension. There is no tenderness.  Musculoskeletal:  Able to move all extremities Has trace bilateral lower extremity edema    Lymphadenopathy:    He has no cervical adenopathy.  Neurological: He is alert.  Skin: Skin is warm and dry. He is not diaphoretic.  Psychiatric: He has a normal mood and affect     ASSESSMENT/ PLAN:  1.  Hypertension: is stable will continue lopressor 75 mg twice daily and will monitor  2.  Dysphagia: no signs of aspiration present; is on thin liquids  will monitor his status.   3. Hyponatremia: has psychogenic polydipsia:  is chronic: is on nacl 1 gm daily with a na++ of 137; will not make changes will monitor  4. BPH: will continue flomax 0.4 mg  daily  5.  Seizure: no reports of seizure activity present. Is presently not on medications; will continue to monitor his status.   6. Anemia: is stable hgb is 12.9 will monitor   Will check cbc; cmp      [name] NP [facility]  Contact [phone] Monday through Friday 8am- 5pm  After hours call [phone]

## 2016-04-17 DIAGNOSIS — E785 Hyperlipidemia, unspecified: Secondary | ICD-10-CM | POA: Diagnosis not present

## 2016-04-17 DIAGNOSIS — I10 Essential (primary) hypertension: Secondary | ICD-10-CM | POA: Diagnosis not present

## 2016-04-20 ENCOUNTER — Non-Acute Institutional Stay (SKILLED_NURSING_FACILITY): Payer: Medicare Other | Admitting: Adult Health

## 2016-04-20 ENCOUNTER — Encounter: Payer: Self-pay | Admitting: Adult Health

## 2016-04-20 DIAGNOSIS — R1314 Dysphagia, pharyngoesophageal phase: Secondary | ICD-10-CM

## 2016-04-20 DIAGNOSIS — I10 Essential (primary) hypertension: Secondary | ICD-10-CM | POA: Diagnosis not present

## 2016-04-20 DIAGNOSIS — J9811 Atelectasis: Secondary | ICD-10-CM | POA: Diagnosis not present

## 2016-04-20 DIAGNOSIS — J189 Pneumonia, unspecified organism: Secondary | ICD-10-CM | POA: Diagnosis not present

## 2016-04-20 DIAGNOSIS — Y95 Nosocomial condition: Principal | ICD-10-CM

## 2016-04-20 DIAGNOSIS — E785 Hyperlipidemia, unspecified: Secondary | ICD-10-CM | POA: Diagnosis not present

## 2016-04-20 NOTE — Progress Notes (Signed)
Location:  [facility] Room Number: 143 A Place of Service:  SNF (31)   CODE STATUS: full code   No Known Allergies  Chief Complaint  Patient presents with  . Acute Visit    patient status     HPI:  Staff has expressed concerned about his overall status. He is somewhat more confused; is having increased difficulty propelling himself in his wheelchair. He is coughing and has a low grade fever of 100.0 he is unable to fully participate in the hpi or ros.  I am concerned about possible aspiration.    Past Medical History  Diagnosis Date  . Mental retardation   . Hypertension   . Seizures ([place])   . Pneumonia   . Psychogenic polydipsia   . B12 deficiency anemia   . Cataract   . Altered mental status   . BPH (benign prostatic hypertrophy)   . Chronic hyponatremia     [name] 01/13/2012  . B12 deficiency 01/14/2012  . Dysphagia     Past Surgical History  Procedure Laterality Date  . No past surgeries      Social History   Social History  . Marital Status: Single    Spouse Name: N/A  . Number of Children: N/A  . Years of Education: N/A   Occupational History  . Not on file.   Social History Main Topics  . Smoking status: Former Research scientist (life sciences)  . Smokeless tobacco: Not on file  . Alcohol Use: No  . Drug Use: No  . Sexual Activity: Not on file   Other Topics Concern  . Not on file   Social History Narrative   Family History  Problem Relation Age of Onset  . Family history unknown: Yes      VITAL SIGNS BP 138/73 mmHg  Pulse 102  Temp(Src) 100 F (37.8 C)  Resp 20  Ht 5\' 5"  (1.651 m)  Wt 161 lb 6.4 oz (73.211 kg)  BMI 26.86 kg/m2  SpO2 97%  Patient's Medications  New Prescriptions   No medications on file  Previous Medications   ACETAMINOPHEN (TYLENOL) 325 MG TABLET    Take 650 mg by mouth every 6 (six) hours as needed. pain   ASPIRIN 81 MG CHEWABLE TABLET    Chew 81 mg by mouth daily.   MAGNESIUM HYDROXIDE (MILK OF  MAGNESIA) 400 MG/5ML SUSPENSION    Take 30 mLs by mouth daily as needed. For constipation   METOPROLOL (LOPRESSOR) 50 MG TABLET    Take 75 mg by mouth 2 (two) times daily.   SODIUM CHLORIDE 1 G TABLET    Take 1 g by mouth every morning.   TAMSULOSIN HCL (FLOMAX) 0.4 MG CAPS    Take 0.4 mg by mouth daily.   Modified Medications   No medications on file  Discontinued Medications   No medications on file     SIGNIFICANT DIAGNOSTIC EXAMS   09-17-14: chest x-ray: no active disease seen in chest   LABS REVIEWED;   10-18-15: glucose 96; bun 11; creat 0.69; k+ 4.6; na++137; liver normal albumin 3.2; chol 148; ldl 96; trig 55; hdl 41; tsh 2.801 04-17-16: wbc 6.3; hgb 14.0; hct 41.9;mcv 90.7 plt 233; glucose 80; bun 11; creat 0.77; k+ 4.3; na++ 137; liver normal albumin 3.5     Review of Systems Unable to perform ROS: Other      Physical Exam Constitutional: No distress. frail  Eyes: Conjunctivae are normal.  Neck: Neck supple. No JVD  present. No thyromegaly present.  Cardiovascular: Normal rate, regular rhythm and intact distal pulses.   Respiratory: rhonchi present. Difficult to hear-does not take deep breath   GI: Soft. Bowel sounds are normal. He exhibits no distension. There is no tenderness.  Musculoskeletal:  Able to move all extremities Has trace bilateral lower extremity edema    Lymphadenopathy:    He has no cervical adenopathy.  Neurological: He is alert.  Skin: Skin is warm and dry. He is not diaphoretic.  Psychiatric: He has a normal mood and affect    ASSESSMENT/ PLAN:  1. pneumonia 2. dysphagia  Will get status chest x-ray; cbc; bmp; ua/c&s(due to fever)  Will give rocephin 1 gm im now Will then levaquin 750 mg daily for 10 days Will have speech see him for dysphagia   Time spent with patient 30   minutes >50% time spent counseling; reviewing medical record; tests; labs; and developing future plan of care   [name] NP [facility] Adult [facility]    Contact [phone] Monday through Friday 8am- 5pm  After hours call [phone]

## 2016-04-22 DIAGNOSIS — I10 Essential (primary) hypertension: Secondary | ICD-10-CM | POA: Diagnosis not present

## 2016-04-22 LAB — HEPATIC FUNCTION PANEL
ALK PHOS: 80 U/L (ref 25–125)
ALT: 19 U/L (ref 10–40)
AST: 63 U/L — AB (ref 14–40)
Bilirubin, Total: 0.7 mg/dL

## 2016-04-22 LAB — BASIC METABOLIC PANEL
BUN: 17 mg/dL (ref 4–21)
CREATININE: 0.9 mg/dL (ref 0.6–1.3)
Glucose: 82 mg/dL
Potassium: 4.5 mmol/L (ref 3.4–5.3)
SODIUM: 139 mmol/L (ref 137–147)

## 2016-04-22 LAB — CBC AND DIFFERENTIAL
HCT: 40 % — AB (ref 41–53)
Hemoglobin: 13.4 g/dL — AB (ref 13.5–17.5)
Neutrophils Absolute: 5005 /uL
Platelets: 202 10*3/uL (ref 150–399)
WBC: 7.7 10*3/mL

## 2016-05-18 ENCOUNTER — Encounter: Payer: Self-pay | Admitting: Adult Health

## 2016-05-18 ENCOUNTER — Non-Acute Institutional Stay (SKILLED_NURSING_FACILITY): Payer: Medicare Other | Admitting: Adult Health

## 2016-05-18 DIAGNOSIS — E871 Hypo-osmolality and hyponatremia: Secondary | ICD-10-CM | POA: Diagnosis not present

## 2016-05-18 DIAGNOSIS — I10 Essential (primary) hypertension: Secondary | ICD-10-CM

## 2016-05-18 DIAGNOSIS — R569 Unspecified convulsions: Secondary | ICD-10-CM

## 2016-05-18 DIAGNOSIS — D649 Anemia, unspecified: Secondary | ICD-10-CM

## 2016-05-18 DIAGNOSIS — R1314 Dysphagia, pharyngoesophageal phase: Secondary | ICD-10-CM | POA: Diagnosis not present

## 2016-05-18 DIAGNOSIS — F54 Psychological and behavioral factors associated with disorders or diseases classified elsewhere: Secondary | ICD-10-CM

## 2016-05-18 DIAGNOSIS — N400 Enlarged prostate without lower urinary tract symptoms: Secondary | ICD-10-CM | POA: Diagnosis not present

## 2016-05-18 DIAGNOSIS — R631 Polydipsia: Secondary | ICD-10-CM | POA: Diagnosis not present

## 2016-05-18 NOTE — Progress Notes (Signed)
Patient ID: Jesus Ramsey, male   DOB: [birth year], 82 y.o.   MRN: [id number]   Location:   [facility] Room Number: 143-A Place of Service:  SNF (31)   CODE STATUS: Full Code  No Known Allergies  Chief Complaint  Patient presents with  . Medical Management of Chronic Issues    Follow up    HPI:  He is a long term resident of this facility being seen for the management of his chronic illnesses. Overall his status is stale. He was treated for pneumonia this past month without complications. He is unable to fully participate in the hpi or ros. There are no nursing concerns at this time.   Past Medical History  Diagnosis Date  . Mental retardation   . Hypertension   . Seizures ([place])   . Pneumonia   . Psychogenic polydipsia   . B12 deficiency anemia   . Cataract   . Altered mental status   . BPH (benign prostatic hypertrophy)   . Chronic hyponatremia     [name] 01/13/2012  . B12 deficiency 01/14/2012  . Dysphagia     Past Surgical History  Procedure Laterality Date  . No past surgeries      Social History   Social History  . Marital Status: Single    Spouse Name: N/A  . Number of Children: N/A  . Years of Education: N/A   Occupational History  . Not on file.   Social History Main Topics  . Smoking status: Former Research scientist (life sciences)  . Smokeless tobacco: Not on file  . Alcohol Use: No  . Drug Use: No  . Sexual Activity: Not on file   Other Topics Concern  . Not on file   Social History Narrative   Family History  Problem Relation Age of Onset  . Family history unknown: Yes      VITAL SIGNS BP 115/62 mmHg  Pulse 70  Temp(Src) 98 F (36.7 C) (Oral)  Resp 18  Ht 5\' 5"  (1.651 m)  Wt 160 lb (72.576 kg)  BMI 26.63 kg/m2  SpO2 97%  Patient's Medications  New Prescriptions   No medications on file  Previous Medications   ACETAMINOPHEN (TYLENOL) 325 MG TABLET    Take 650 mg by mouth every 6 (six) hours as needed. pain   ASPIRIN 81 MG CHEWABLE TABLET     Chew 81 mg by mouth daily.   MAGNESIUM HYDROXIDE (MILK OF MAGNESIA) 400 MG/5ML SUSPENSION    Take 30 mLs by mouth daily as needed. For constipation   METOPROLOL (LOPRESSOR) 50 MG TABLET    Take 75 mg by mouth 2 (two) times daily.   SODIUM CHLORIDE 1 G TABLET    Take 1 g by mouth every morning.   TAMSULOSIN HCL (FLOMAX) 0.4 MG CAPS    Take 0.4 mg by mouth daily.   Modified Medications   No medications on file  Discontinued Medications   No medications on file     SIGNIFICANT DIAGNOSTIC EXAMS  09-17-14: chest x-ray: no active disease seen in chest   LABS REVIEWED;   10-18-15: glucose 96; bun 11; creat 0.69; k+ 4.6; na++137; liver normal albumin 3.2; chol 148; ldl 96; trig 55; hdl 41; tsh 2.801 04-17-16: wbc 6.3; hgb 14.0; hct 41.9;mcv 90.7 plt 233; glucose 80; bun 11; creat 0.77; k+ 4.3; na++ 137; liver normal albumin 3.5     Review of Systems Unable to perform ROS: Other      Physical Exam Constitutional: No  distress. frail  Eyes: Conjunctivae are normal.  Neck: Neck supple. No JVD present. No thyromegaly present.  Cardiovascular: Normal rate, regular rhythm and intact distal pulses.   Respiratory: rhonchi present. Difficult to hear-does not take deep breath   GI: Soft. Bowel sounds are normal. He exhibits no distension. There is no tenderness.  Musculoskeletal:  Able to move all extremities Has trace bilateral lower extremity edema    Lymphadenopathy:    He has no cervical adenopathy.  Neurological: He is alert.  Skin: Skin is warm and dry. He is not diaphoretic.  Psychiatric: He has a normal mood and affect    ASSESSMENT/ PLAN:   1.  Hypertension: is stable will continue lopressor 75 mg twice daily and will monitor  2.  Dysphagia: no signs of aspiration present; is on thin liquids  will monitor his status.   3. Hyponatremia: has psychogenic polydipsia:  is chronic: is on nacl 1 gm daily with a na++ of 137; will not make changes will monitor  4. BPH: will  continue flomax 0.4 mg  daily  5. Seizure: no reports of seizure activity present. Is presently not on medications; will continue to monitor his status.   6. Anemia: is stable hgb is 14.0 will monitor       [name] NP [facility] Adult Medicine  Contact [phone] Monday through Friday 8am- 5pm  After hours call [phone]

## 2016-06-19 DIAGNOSIS — M79672 Pain in left foot: Secondary | ICD-10-CM | POA: Diagnosis not present

## 2016-06-19 DIAGNOSIS — B351 Tinea unguium: Secondary | ICD-10-CM | POA: Diagnosis not present

## 2016-06-19 DIAGNOSIS — M79671 Pain in right foot: Secondary | ICD-10-CM | POA: Diagnosis not present

## 2016-06-19 DIAGNOSIS — I2510 Atherosclerotic heart disease of native coronary artery without angina pectoris: Secondary | ICD-10-CM | POA: Diagnosis not present

## 2016-06-22 ENCOUNTER — Non-Acute Institutional Stay (SKILLED_NURSING_FACILITY): Payer: Medicare Other | Admitting: Adult Health

## 2016-06-22 ENCOUNTER — Encounter: Payer: Self-pay | Admitting: Adult Health

## 2016-06-22 DIAGNOSIS — D649 Anemia, unspecified: Secondary | ICD-10-CM | POA: Diagnosis not present

## 2016-06-22 DIAGNOSIS — R631 Polydipsia: Secondary | ICD-10-CM

## 2016-06-22 DIAGNOSIS — R1314 Dysphagia, pharyngoesophageal phase: Secondary | ICD-10-CM | POA: Diagnosis not present

## 2016-06-22 DIAGNOSIS — E871 Hypo-osmolality and hyponatremia: Secondary | ICD-10-CM

## 2016-06-22 DIAGNOSIS — N400 Enlarged prostate without lower urinary tract symptoms: Secondary | ICD-10-CM

## 2016-06-22 DIAGNOSIS — I10 Essential (primary) hypertension: Secondary | ICD-10-CM | POA: Diagnosis not present

## 2016-06-22 DIAGNOSIS — F54 Psychological and behavioral factors associated with disorders or diseases classified elsewhere: Secondary | ICD-10-CM

## 2016-06-22 DIAGNOSIS — R569 Unspecified convulsions: Secondary | ICD-10-CM

## 2016-06-22 NOTE — Progress Notes (Signed)
Patient ID: Jesus Ramsey, male   DOB: 04-23-1931, 82 y.o.   MRN: [id number]   Location:   [place] Room Number: 143-A Place of Service:  SNF (31)   CODE STATUS: Full Code  No Known Allergies  Chief Complaint  Patient presents with  . Medical Management of Chronic Issues    Follow up    HPI:  He is a long term resident of this facility being seen for the management of his chronic illnesses. Overall there is little change in his status. He is out of bed daily and does wheel himself throughout the facility. He cannot fully participate in the hpi or ros; but did tell me that he is feeling good. There are no nursing concerns at this time.    Past Medical History:  Diagnosis Date  . Altered mental status   . B12 deficiency 01/14/2012  . B12 deficiency anemia   . BPH (benign prostatic hypertrophy)   . Cataract   . Chronic hyponatremia    Jesus Ramsey 01/13/2012  . Dysphagia   . Hypertension   . Mental retardation   . Pneumonia   . Psychogenic polydipsia   . Seizures ([place])     Past Surgical History:  Procedure Laterality Date  . NO PAST SURGERIES      Social History   Social History  . Marital status: Single    Spouse name: N/A  . Number of children: N/A  . Years of education: N/A   Occupational History  . Not on file.   Social History Main Topics  . Smoking status: Former Research scientist (life sciences)  . Smokeless tobacco: Not on file  . Alcohol use No  . Drug use: No  . Sexual activity: Not on file   Other Topics Concern  . Not on file   Social History Narrative  . No narrative on file   Family History  Problem Relation Age of Onset  . Family history unknown: Yes      VITAL SIGNS BP 125/71   Pulse 65   Temp 97.4 F (36.3 C) (Oral)   Resp 18   Ht 5\' 5"  (1.651 m)   Wt 160 lb (72.6 kg)   SpO2 97%   BMI 26.63 kg/m   Patient's Medications  New Prescriptions   No medications on file  Previous Medications   ACETAMINOPHEN (TYLENOL) 325 MG TABLET    Take 650  mg by mouth every 6 (six) hours as needed. pain   ASPIRIN 81 MG CHEWABLE TABLET    Chew 81 mg by mouth daily.   MAGNESIUM HYDROXIDE (MILK OF MAGNESIA) 400 MG/5ML SUSPENSION    Take 30 mLs by mouth daily as needed. For constipation   METOPROLOL (LOPRESSOR) 50 MG TABLET    Take 75 mg by mouth 2 (two) times daily.   SODIUM CHLORIDE 1 G TABLET    Take 1 g by mouth every morning.   TAMSULOSIN HCL (FLOMAX) 0.4 MG CAPS    Take 0.4 mg by mouth daily.   Modified Medications   No medications on file  Discontinued Medications   No medications on file     SIGNIFICANT DIAGNOSTIC EXAMS  09-17-14: chest x-ray: no active disease seen in chest   LABS REVIEWED;   10-18-15: glucose 96; bun 11; creat 0.69; k+ 4.6; na++137; liver normal albumin 3.2; chol 148; ldl 96; trig 55; hdl 41; tsh 2.801 04-17-16: wbc 6.3; hgb 14.0; hct 41.9;mcv 90.7 plt 233; glucose 80; bun 11; creat 0.77; k+ 4.3; na++  137; liver normal albumin 3.5  04-22-16: wbc 7.7; hgb 13.4; hct 39.7; mcv 89.0; pt 202; glucose 82; bun 17; creat 0.88; k+ 4.5; na++ 139 liver normal albumin 3.3    Review of Systems Unable to perform ROS: Other      Physical Exam Constitutional: No distress. frail  Eyes: Conjunctivae are normal.  Neck: Neck supple. No JVD present. No thyromegaly present.  Cardiovascular: Normal rate, regular rhythm and intact distal pulses.   Respiratory: lungs clear  Difficult to hear-does not take deep breath   GI: Soft. Bowel sounds are normal. He exhibits no distension. There is no tenderness.  Musculoskeletal:  Able to move all extremities Has trace bilateral lower extremity edema    Lymphadenopathy:    He has no cervical adenopathy.  Neurological: He is alert.  Skin: Skin is warm and dry. He is not diaphoretic.  Psychiatric: He has a normal mood and affect    ASSESSMENT/ PLAN:   1.  Hypertension: is stable will continue lopressor 75 mg twice daily and will monitor  2.  Dysphagia: no signs of aspiration present;  is on thin liquids  will monitor his status.   3. Hyponatremia: has psychogenic polydipsia:  is chronic: is on nacl 1 gm daily with a na++ of 137; will not make changes will monitor  4. BPH: will continue flomax 0.4 mg  daily  5. Seizure: no reports of seizure activity present. Is presently not on medications; will continue to monitor his status.   6. Anemia: is stable hgb is 13.4  will monitor     [name] NP [facility]  Contact [phone] Monday through Friday 8am- 5pm  After hours call [phone]

## 2016-07-28 ENCOUNTER — Encounter: Payer: Self-pay | Admitting: Internal Medicine

## 2016-07-28 ENCOUNTER — Non-Acute Institutional Stay (SKILLED_NURSING_FACILITY): Payer: Medicare Other | Admitting: Internal Medicine

## 2016-07-28 DIAGNOSIS — R631 Polydipsia: Secondary | ICD-10-CM | POA: Diagnosis not present

## 2016-07-28 DIAGNOSIS — I10 Essential (primary) hypertension: Secondary | ICD-10-CM

## 2016-07-28 DIAGNOSIS — E871 Hypo-osmolality and hyponatremia: Secondary | ICD-10-CM

## 2016-07-28 DIAGNOSIS — F54 Psychological and behavioral factors associated with disorders or diseases classified elsewhere: Secondary | ICD-10-CM

## 2016-07-28 DIAGNOSIS — R569 Unspecified convulsions: Secondary | ICD-10-CM | POA: Diagnosis not present

## 2016-07-28 DIAGNOSIS — N400 Enlarged prostate without lower urinary tract symptoms: Secondary | ICD-10-CM | POA: Diagnosis not present

## 2016-07-28 NOTE — Progress Notes (Signed)
Patient ID: Jesus Ramsey, male   DOB: 06/15/1931, 83 y.o.   MRN: [id number]    DATE:  07/28/2016  Location:    [place] Room Number: 143 A Place of Service: SNF (31)   Extended Emergency Contact Information Primary Emergency Contact: Chamorro,[name] Address: [place] [address] [place] Phone: [phone] Relation: Brother  Advanced Directive information Does patient have an advance directive?: No, Would patient like information on creating an advanced directive?: No - patient declined information  Chief Complaint  Patient presents with  . Medical Management of Chronic Issues    Routine Visit    HPI:  82 yo male long term resident seen today for f/u. He is a poor historian due to mentally challenged. Hx obtained from chart. No nursing issues. No falls.  Hypertension - BP stable on lopressor 75 mg twice daily. He takes ASA daily  Hx Dysphagia - currently no signs of aspiration at present. Diet is thin liquids. Albumin 3.3     Hyponatremia - he has chronic psychogenic polydipsia - takes  NaCl 1 gm daily. Na 139  BPH - stable on flomax 0.4 mg  daily  Seizure d/o - no reported sz. He does not take any meds at this time  Hx Anemia - stable. Hgb 13.4  Past Medical History:  Diagnosis Date  . Altered mental status   . B12 deficiency 01/14/2012  . B12 deficiency anemia   . BPH (benign prostatic hypertrophy)   . Cataract   . Chronic hyponatremia    [name] 01/13/2012  . Dysphagia   . Hypertension   . Mental retardation   . Pneumonia   . Psychogenic polydipsia   . Seizures ([place])     Past Surgical History:  Procedure Laterality Date  . NO PAST SURGERIES      Patient Care Team: [name], DO as PCP - General (Internal Medicine) [name], NP as Nurse Practitioner (Nurse Practitioner) Tarri Abernethy ([place])  Social History   Social History  . Marital status: Single    Spouse name: N/A  . Number of children: N/A  . Years of education: N/A   Occupational History  . Not on file.   Social History Main Topics  . Smoking status: Former Research scientist (life sciences)  . Smokeless tobacco: Never Used  . Alcohol use No  . Drug use: No  . Sexual activity: Not on file   Other Topics Concern  . Not on file   Social History Narrative  . No narrative on file     reports that he has quit smoking. He has never used smokeless tobacco. He reports that he does not drink alcohol or use drugs.  Family History  Problem Relation Age of Onset  . Family history unknown: Yes   No family status information on file.    Immunization History  Administered Date(s) Administered  . Influenza-Unspecified 08/22/2014    No Known Allergies  Medications: Patient's Medications  New Prescriptions   No medications on file  Previous Medications   ACETAMINOPHEN (TYLENOL) 325 MG TABLET    Take 650 mg by mouth every 6 (six) hours as needed. pain   ASPIRIN 81 MG CHEWABLE TABLET    Chew 81 mg by mouth daily.   MAGNESIUM HYDROXIDE (MILK OF MAGNESIA) 400 MG/5ML SUSPENSION    Take 30 mLs by mouth daily as needed. For constipation   METOPROLOL TARTRATE 75  MG TABS    Take 75 mg by mouth 2 (two) times daily.   SODIUM CHLORIDE 1 G TABLET    Take 1 g by mouth every morning.   TAMSULOSIN HCL (FLOMAX) 0.4 MG CAPS    Take 0.4 mg by mouth daily.    UNABLE TO FIND    Med Name: Frozen magic cup by mouth three times daily  Modified Medications   No medications on file  Discontinued Medications   No medications on file    Review of Systems  Unable to perform ROS: Other (mentally challenged)    Vitals:   07/28/16 1101  BP: 138/76  Pulse: 66  Resp: 16  Temp: 97.9 F (36.6 C)  TempSrc: Oral  SpO2: 95%  Weight: 160 lb 6.4 oz (72.8 kg)  Height: 5\' 5"  (1.651 m)   Body mass index is 26.69 kg/m.  Physical Exam  Constitutional: He appears well-developed.  Frail appearing, sitting in w/c in NAD,  confused  HENT:  Mouth/Throat: Oropharynx is clear and moist.  Poor dentition  Eyes: Pupils are equal, round, and reactive to light. No scleral icterus.  Neck: Neck supple. Carotid bruit is not present. No thyromegaly present.  Cardiovascular: Normal rate, regular rhythm, normal heart sounds and intact distal pulses.  Exam reveals no gallop and no friction rub.   No murmur heard. no distal LE swelling. No calf TTP  Pulmonary/Chest: Effort normal and breath sounds normal. He has no wheezes. He has no rales. He exhibits no tenderness.  Abdominal: Soft. Bowel sounds are normal. He exhibits no distension, no abdominal bruit, no pulsatile midline mass and no mass. There is no tenderness. There is no rebound and no guarding.  Musculoskeletal: He exhibits edema (small, intermdte and large joint swelling) and deformity (small and large joint).  R>L ankle/foot swelling  Lymphadenopathy:    He has no cervical adenopathy.  Neurological: He is alert.  Skin: Skin is warm and dry. No rash noted.  Psychiatric: His affect is angry. He is agitated.     Labs reviewed: Nursing Home on 05/18/2016  Component Date Value Ref Range Status  . Hemoglobin 04/22/2016 13.4* 13.5 - 17.5 g/dL Final  . HCT 04/22/2016 40* 41 - 53 % Final  . Neutrophils Absolute 04/22/2016 5005  /L Final  . Platelets 04/22/2016 202  150 - 399 K/L Final  . WBC 04/22/2016 7.7  10^3/mL Final  . Glucose 04/22/2016 82  mg/dL Final  . BUN 04/22/2016 17  4 - 21 mg/dL Final  . Creatinine 04/22/2016 0.9  0.6 - 1.3 mg/dL Final  . Potassium 04/22/2016 4.5  3.4 - 5.3 mmol/L Final  . Sodium 04/22/2016 139  137 - 147 mmol/L Final  . Alkaline Phosphatase 04/22/2016 80  25 - 125 U/L Final  . ALT 04/22/2016 19  10 - 40 U/L Final  . AST 04/22/2016 63* 14 - 40 U/L Final  . Bilirubin, Total 04/22/2016 0.7  mg/dL Final    No results found.   Assessment/Plan   ICD-9-CM ICD-10-CM   1. Essential hypertension 401.9 I10   2. Psychogenic  polydipsia 316 F54    783.5 R63.1   3. Hyponatremia 276.1 E87.1   4. Seizures ([place]) 780.39 R56.9   5. BPH (benign prostatic hyperplasia) 600.00 N40.0    Cont current meds as ordered  Cont nutritional supplements as ordered  PT/OT/ST as indicated  Will follow  Leyland Kenna S. Byan [place], [facility] [phone]  [place], [place] [address] [phone] Cell (Monday-Friday 8 AM - 5 PM) [phone] After 5 PM and follow prompts

## 2016-08-03 DIAGNOSIS — H401112 Primary open-angle glaucoma, right eye, moderate stage: Secondary | ICD-10-CM | POA: Diagnosis not present

## 2016-08-03 DIAGNOSIS — H2513 Age-related nuclear cataract, bilateral: Secondary | ICD-10-CM | POA: Diagnosis not present

## 2016-08-03 DIAGNOSIS — H401122 Primary open-angle glaucoma, left eye, moderate stage: Secondary | ICD-10-CM | POA: Diagnosis not present

## 2016-08-03 DIAGNOSIS — H35373 Puckering of macula, bilateral: Secondary | ICD-10-CM | POA: Diagnosis not present

## 2016-08-27 ENCOUNTER — Encounter: Payer: Self-pay | Admitting: Adult Health

## 2016-08-27 ENCOUNTER — Non-Acute Institutional Stay (SKILLED_NURSING_FACILITY): Payer: Medicare Other | Admitting: Adult Health

## 2016-08-27 DIAGNOSIS — I10 Essential (primary) hypertension: Secondary | ICD-10-CM | POA: Diagnosis not present

## 2016-08-27 DIAGNOSIS — R631 Polydipsia: Secondary | ICD-10-CM | POA: Diagnosis not present

## 2016-08-27 DIAGNOSIS — R1314 Dysphagia, pharyngoesophageal phase: Secondary | ICD-10-CM | POA: Diagnosis not present

## 2016-08-27 DIAGNOSIS — R569 Unspecified convulsions: Secondary | ICD-10-CM

## 2016-08-27 DIAGNOSIS — D649 Anemia, unspecified: Secondary | ICD-10-CM | POA: Diagnosis not present

## 2016-08-27 DIAGNOSIS — E871 Hypo-osmolality and hyponatremia: Secondary | ICD-10-CM | POA: Diagnosis not present

## 2016-08-27 DIAGNOSIS — F54 Psychological and behavioral factors associated with disorders or diseases classified elsewhere: Secondary | ICD-10-CM

## 2016-08-27 DIAGNOSIS — N400 Enlarged prostate without lower urinary tract symptoms: Secondary | ICD-10-CM | POA: Diagnosis not present

## 2016-08-27 NOTE — Progress Notes (Signed)
Patient ID: Jesus Ramsey, male   DOB: June 25, 1931, 82 y.o.   MRN: [id number]   Location:    [place] Room Number: 143-A Place of Service:  SNF (31)   CODE STATUS: Full Code  No Known Allergies  Chief Complaint  Patient presents with  . Medical Management of Chronic Issues    Follow up    HPI:  He is a long term resident of this facility being seen for the management of his chronic illnesses. Overall his status is stable. He continues to get out of bed on a daily basis and does propel himself throughout the facility. His is unable to fully participate in the hpi or ros; but tell me that he feels good. There are no nursing concerns at this time.    Past Medical History:  Diagnosis Date  . Altered mental status   . B12 deficiency 01/14/2012  . B12 deficiency anemia   . BPH (benign prostatic hypertrophy)   . Cataract   . Chronic hyponatremia    Jesus Ramsey 01/13/2012  . Dysphagia   . Hypertension   . Mental retardation   . Pneumonia   . Psychogenic polydipsia   . Seizures (Jesus Ramsey)     Past Surgical History:  Procedure Laterality Date  . NO PAST SURGERIES      Social History   Social History  . Marital status: Single    Spouse name: N/A  . Number of children: N/A  . Years of education: N/A   Occupational History  . Not on file.   Social History Main Topics  . Smoking status: Former Research scientist (life sciences)  . Smokeless tobacco: Never Used  . Alcohol use No  . Drug use: No  . Sexual activity: Not on file   Other Topics Concern  . Not on file   Social History Narrative  . No narrative on file   Family History  Problem Relation Age of Onset  . Family history unknown: Yes      VITAL SIGNS BP (!) 153/79   Pulse 75   Temp 97.5 F (36.4 C) (Oral)   Resp 17   Ht 5\' 5"  (1.651 m)   Wt 159 lb 4 oz (72.2 kg)   SpO2 97%   BMI 26.50 kg/m   Patient's Medications  New Prescriptions   No medications on file  Previous Medications   ACETAMINOPHEN (TYLENOL) 325 MG  TABLET    Take 650 mg by mouth every 6 (six) hours as needed. pain   ASPIRIN 81 MG CHEWABLE TABLET    Chew 81 mg by mouth daily.   MAGNESIUM HYDROXIDE (MILK OF MAGNESIA) 400 MG/5ML SUSPENSION    Take 30 mLs by mouth daily as needed. For constipation   METOPROLOL TARTRATE 75 MG TABS    Take 75 mg by mouth 2 (two) times daily.   SODIUM CHLORIDE 1 G TABLET    Take 1 g by mouth every morning.   TAMSULOSIN HCL (FLOMAX) 0.4 MG CAPS    Take 0.4 mg by mouth daily.    UNABLE TO FIND    Med Name: Frozen magic cup by mouth three times daily  Modified Medications   No medications on file  Discontinued Medications   No medications on file     SIGNIFICANT DIAGNOSTIC EXAMS  09-17-14: chest x-ray: no active disease seen in chest   LABS REVIEWED;   10-18-15: glucose 96; bun 11; creat 0.69; k+ 4.6; na++137; liver normal albumin 3.2; chol 148; ldl 96; trig 55;  hdl 41; tsh 2.801 04-17-16: wbc 6.3; hgb 14.0; hct 41.9;mcv 90.7 plt 233; glucose 80; bun 11; creat 0.77; k+ 4.3; na++ 137; liver normal albumin 3.5  04-22-16: wbc 7.7; hgb 13.4; hct 39.7; mcv 89.0; pt 202; glucose 82; bun 17; creat 0.88; k+ 4.5; na++ 139 liver normal albumin 3.3    Review of Systems Unable to perform ROS: Other      Physical Exam Constitutional: No distress. frail  Eyes: Conjunctivae are normal.  Neck: Neck supple. No JVD present. No thyromegaly present.  Cardiovascular: Normal rate, regular rhythm and intact distal pulses.   Respiratory: lungs clear  Difficult to hear-does not take deep breath   GI: Soft. Bowel sounds are normal. He exhibits no distension. There is no tenderness.  Musculoskeletal:  Able to move all extremities Has trace bilateral lower extremity edema    Lymphadenopathy:    He has no cervical adenopathy.  Neurological: He is alert.  Skin: Skin is warm and dry. He is not diaphoretic.  Psychiatric: He has a normal mood and affect    ASSESSMENT/ PLAN:  1.  Hypertension: is stable will continue  lopressor 75 mg twice daily and will monitor  2.  Dysphagia: no signs of aspiration present; is on thin liquids  will monitor his status.   3. Hyponatremia: has psychogenic polydipsia:  is chronic: is on nacl 1 gm daily with a na++ of 137; will not make changes will monitor  4. BPH: will continue flomax 0.4 mg  daily  5. Seizure: no reports of seizure activity present. Is presently not on medications; will continue to monitor his status.   6. Anemia: is stable hgb is 13.4  will monitor      Jesus Edwards NP [facility]  Contact [phone] Monday through Friday 8am- 5pm  After hours call [phone]

## 2016-09-07 DIAGNOSIS — Z23 Encounter for immunization: Secondary | ICD-10-CM | POA: Diagnosis not present

## 2016-09-18 DIAGNOSIS — F0390 Unspecified dementia without behavioral disturbance: Secondary | ICD-10-CM | POA: Diagnosis not present

## 2016-09-28 ENCOUNTER — Non-Acute Institutional Stay (SKILLED_NURSING_FACILITY): Payer: Medicare Other | Admitting: Adult Health

## 2016-09-28 ENCOUNTER — Encounter: Payer: Self-pay | Admitting: Adult Health

## 2016-09-28 DIAGNOSIS — I10 Essential (primary) hypertension: Secondary | ICD-10-CM | POA: Diagnosis not present

## 2016-09-28 DIAGNOSIS — R1314 Dysphagia, pharyngoesophageal phase: Secondary | ICD-10-CM

## 2016-09-28 DIAGNOSIS — R569 Unspecified convulsions: Secondary | ICD-10-CM

## 2016-09-28 DIAGNOSIS — N400 Enlarged prostate without lower urinary tract symptoms: Secondary | ICD-10-CM

## 2016-09-28 DIAGNOSIS — D649 Anemia, unspecified: Secondary | ICD-10-CM | POA: Diagnosis not present

## 2016-09-28 NOTE — Progress Notes (Signed)
Patient ID: Jesus Ramsey, male   DOB: 1931-06-22, 82 y.o.   MRN: [id number]   Location:   [place] Room Number: 143-A Place of Service:  SNF (31)   CODE STATUS: Full Code  No Known Allergies  Chief Complaint  Patient presents with  . Medical Management of Chronic Issues    Follow up    HPI:   He is a long term resident of this facility being seen for the management of his chronic illnesses. Overall his status is stable. He continues to ger out of bed on a daily basis; and does propel himself throughout the facility. He is unable to fully participate in the hpi or ros. There are no nursing concerns at this time.   Past Medical History:  Diagnosis Date  . Altered mental status   . B12 deficiency 01/14/2012  . B12 deficiency anemia   . BPH (benign prostatic hypertrophy)   . Cataract   . Chronic hyponatremia    [name] 01/13/2012  . Dysphagia   . Hypertension   . Mental retardation   . Pneumonia   . Psychogenic polydipsia   . Seizures ([place])     Past Surgical History:  Procedure Laterality Date  . NO PAST SURGERIES      Social History   Social History  . Marital status: Single    Spouse name: N/A  . Number of children: N/A  . Years of education: N/A   Occupational History  . Not on file.   Social History Main Topics  . Smoking status: Former Research scientist (life sciences)  . Smokeless tobacco: Never Used  . Alcohol use No  . Drug use: No  . Sexual activity: Not on file   Other Topics Concern  . Not on file   Social History Narrative  . No narrative on file   Family History  Problem Relation Age of Onset  . Family history unknown: Yes      VITAL SIGNS BP (!) 100/58   Pulse 78   Temp 97.5 F (36.4 C) (Oral)   Resp 18   Ht 5\' 5"  (1.651 m)   Wt 159 lb 4 oz (72.2 kg)   SpO2 97%   BMI 26.50 kg/m   Patient's Medications  New Prescriptions   No medications on file  Previous Medications   ACETAMINOPHEN (TYLENOL) 325 MG TABLET    Take 650 mg by mouth every  6 (six) hours as needed. pain   ASPIRIN 81 MG CHEWABLE TABLET    Chew 81 mg by mouth daily.   LATANOPROST (XALATAN) 0.005 % OPHTHALMIC SOLUTION    Place 1 drop into both eyes at bedtime.   MAGNESIUM HYDROXIDE (MILK OF MAGNESIA) 400 MG/5ML SUSPENSION    Take 30 mLs by mouth daily as needed. For constipation   METOPROLOL TARTRATE 75 MG TABS    Take 75 mg by mouth 2 (two) times daily.   SODIUM CHLORIDE 1 G TABLET    Take 1 g by mouth every morning.   TAMSULOSIN HCL (FLOMAX) 0.4 MG CAPS    Take 0.4 mg by mouth daily.    UNABLE TO FIND    Med Name: Frozen magic cup by mouth three times daily  Modified Medications   No medications on file  Discontinued Medications   No medications on file     SIGNIFICANT DIAGNOSTIC EXAMS  09-17-14: chest x-ray: no active disease seen in chest   LABS REVIEWED;   10-18-15: glucose 96; bun 11; creat 0.69; k+ 4.6;  na++137; liver normal albumin 3.2; chol 148; ldl 96; trig 55; hdl 41; tsh 2.801 04-17-16: wbc 6.3; hgb 14.0; hct 41.9;mcv 90.7 plt 233; glucose 80; bun 11; creat 0.77; k+ 4.3; na++ 137; liver normal albumin 3.5  04-22-16: wbc 7.7; hgb 13.4; hct 39.7; mcv 89.0; pt 202; glucose 82; bun 17; creat 0.88; k+ 4.5; na++ 139 liver normal albumin 3.3    Review of Systems Unable to perform ROS: Other      Physical Exam Constitutional: No distress. frail  Eyes: Conjunctivae are normal.  Neck: Neck supple. No JVD present. No thyromegaly present.  Cardiovascular: Normal rate, regular rhythm and intact distal pulses.   Respiratory: lungs clear  Difficult to hear-does not take deep breath   GI: Soft. Bowel sounds are normal. He exhibits no distension. There is no tenderness.  Musculoskeletal:  Able to move all extremities Has trace bilateral lower extremity edema    Lymphadenopathy:    He has no cervical adenopathy.  Neurological: He is alert.  Skin: Skin is warm and dry. He is not diaphoretic.  Psychiatric: He has a normal mood and affect    ASSESSMENT/  PLAN:  1.  Hypertension: is stable will continue lopressor 75 mg twice daily and will monitor  2.  Dysphagia: no signs of aspiration present; is on thin liquids  will monitor his status.   3. Hyponatremia: has psychogenic polydipsia:  is chronic: is on nacl 1 gm daily with a na++ of 137; will not make changes will monitor  4. BPH: will continue flomax 0.4 mg  daily  5. Seizure: no reports of seizure activity present. Is presently not on medications; will continue to monitor his status.   6. Anemia: is stable hgb is 13.4  will monitor   [name] NP [facility]  Contact [phone] Monday through Friday 8am- 5pm  After hours call [phone]

## 2016-11-10 ENCOUNTER — Encounter: Payer: Self-pay | Admitting: Adult Health

## 2016-11-10 ENCOUNTER — Non-Acute Institutional Stay (SKILLED_NURSING_FACILITY): Payer: Medicare Other | Admitting: Adult Health

## 2016-11-10 DIAGNOSIS — R569 Unspecified convulsions: Secondary | ICD-10-CM

## 2016-11-10 DIAGNOSIS — R1314 Dysphagia, pharyngoesophageal phase: Secondary | ICD-10-CM

## 2016-11-10 DIAGNOSIS — I10 Essential (primary) hypertension: Secondary | ICD-10-CM | POA: Diagnosis not present

## 2016-11-10 DIAGNOSIS — N400 Enlarged prostate without lower urinary tract symptoms: Secondary | ICD-10-CM | POA: Diagnosis not present

## 2016-11-10 DIAGNOSIS — F54 Psychological and behavioral factors associated with disorders or diseases classified elsewhere: Secondary | ICD-10-CM | POA: Diagnosis not present

## 2016-11-10 DIAGNOSIS — R631 Polydipsia: Secondary | ICD-10-CM

## 2016-11-10 DIAGNOSIS — E871 Hypo-osmolality and hyponatremia: Secondary | ICD-10-CM | POA: Diagnosis not present

## 2016-11-10 NOTE — Progress Notes (Signed)
Location:    Psychiatrist of Service:  SNF (31)   CODE STATUS: full code   No Known Allergies  Chief Complaint  Patient presents with  . Medical Management of Chronic Issues    HPI:  He is a long term resident of this facility being seen for the management of his chronic illnesses. Overall there is little change in his status. He is out of bed daily and does propel himself throughout the facility. He is unable to fully participate in the hpi or ros. There are no nursing concerns at this time.    Past Medical History:  Diagnosis Date  . Altered mental status   . B12 deficiency 01/14/2012  . B12 deficiency anemia   . BPH (benign prostatic hypertrophy)   . Cataract   . Chronic hyponatremia    [name] 01/13/2012  . Dysphagia   . Hypertension   . Mental retardation   . Pneumonia   . Psychogenic polydipsia   . Seizures ([place])     Past Surgical History:  Procedure Laterality Date  . NO PAST SURGERIES      Social History   Social History  . Marital status: Single    Spouse name: N/A  . Number of children: N/A  . Years of education: N/A   Occupational History  . Not on file.   Social History Main Topics  . Smoking status: Former Research scientist (life sciences)  . Smokeless tobacco: Never Used  . Alcohol use No  . Drug use: No  . Sexual activity: Not on file   Other Topics Concern  . Not on file   Social History Narrative  . No narrative on file   Family History  Problem Relation Age of Onset  . Family history unknown: Yes      VITAL SIGNS BP (!) 142/80   Pulse 69   Temp 97.5 F (36.4 C)   Resp 18   Ht 5\' 5"  (1.651 m)   Wt 158 lb (71.7 kg)   SpO2 97%   BMI 26.29 kg/m   Patient's Medications  New Prescriptions   No medications on file  Previous Medications   ACETAMINOPHEN (TYLENOL) 325 MG TABLET    Take 650 mg by mouth every 6 (six) hours as needed. pain   ASPIRIN 81 MG CHEWABLE TABLET    Chew 81 mg by mouth daily.   LATANOPROST (XALATAN) 0.005 %  OPHTHALMIC SOLUTION    Place 1 drop into both eyes at bedtime.   MAGNESIUM HYDROXIDE (MILK OF MAGNESIA) 400 MG/5ML SUSPENSION    Take 30 mLs by mouth daily as needed. For constipation   METOPROLOL TARTRATE 75 MG TABS    Take 75 mg by mouth 2 (two) times daily.   SODIUM CHLORIDE 1 G TABLET    Take 1 g by mouth every morning.   TAMSULOSIN HCL (FLOMAX) 0.4 MG CAPS    Take 0.4 mg by mouth daily.    UNABLE TO FIND    Med Name: Frozen magic cup by mouth three times daily  Modified Medications   No medications on file  Discontinued Medications   No medications on file     SIGNIFICANT DIAGNOSTIC EXAMS   09-17-14: chest x-ray: no active disease seen in chest   LABS REVIEWED;   04-17-16: wbc 6.3; hgb 14.0; hct 41.9;mcv 90.7 plt 233; glucose 80; bun 11; creat 0.77; k+ 4.3; na++ 137; liver normal albumin 3.5  04-22-16: wbc 7.7; hgb 13.4; hct 39.7; mcv  89.0; pt 202; glucose 82; bun 17; creat 0.88; k+ 4.5; na++ 139 liver normal albumin 3.3    Review of Systems Unable to perform ROS: Other      Physical Exam Constitutional: No distress. frail  Eyes: Conjunctivae are normal.  Neck: Neck supple. No JVD present. No thyromegaly present.  Cardiovascular: Normal rate, regular rhythm and intact distal pulses.   Respiratory: lungs clear  Difficult to hear-does not take deep breath   GI: Soft. Bowel sounds are normal. He exhibits no distension. There is no tenderness.  Musculoskeletal:  Able to move all extremities Has trace bilateral lower extremity edema    Lymphadenopathy:    He has no cervical adenopathy.  Neurological: He is alert.  Skin: Skin is warm and dry. He is not diaphoretic.  Psychiatric: He has a normal mood and affect    ASSESSMENT/ PLAN:  1.  Hypertension: is stable will continue lopressor 75 mg twice daily and will monitor  2.  Dysphagia: no signs of aspiration present; is on thin liquids  will monitor his status.   3. Hyponatremia: has psychogenic polydipsia:  is chronic:  is on nacl 1 gm daily with a na++ of 137; will not make changes will monitor  4. BPH: will continue flomax 0.4 mg  daily  5. Seizure: no reports of seizure activity present. Is presently not on medications; will continue to monitor his status.   6. Anemia: is stable hgb is 13.4  will monitor   Will check cbc; cmp    [name] NP [facility]  Contact [phone] Monday through Friday 8am- 5pm  After hours call [phone]

## 2016-11-11 LAB — BASIC METABOLIC PANEL
BUN: 11 mg/dL (ref 4–21)
Creatinine: 0.7 mg/dL (ref 0.6–1.3)
Glucose: 88 mg/dL
Potassium: 4.8 mmol/L (ref 3.4–5.3)
Sodium: 135 mmol/L — AB (ref 137–147)

## 2016-11-11 LAB — CBC AND DIFFERENTIAL
HEMATOCRIT: 41 % (ref 41–53)
Hemoglobin: 13.3 g/dL — AB (ref 13.5–17.5)
Neutrophils Absolute: 4 /uL
PLATELETS: 203 10*3/uL (ref 150–399)
WBC: 7.3 10*3/mL

## 2016-11-11 LAB — HEPATIC FUNCTION PANEL
ALT: 9 U/L — AB (ref 10–40)
AST: 16 U/L (ref 14–40)
Alkaline Phosphatase: 88 U/L (ref 25–125)
BILIRUBIN, TOTAL: 0.4 mg/dL

## 2016-11-24 DIAGNOSIS — I70203 Unspecified atherosclerosis of native arteries of extremities, bilateral legs: Secondary | ICD-10-CM | POA: Diagnosis not present

## 2016-11-24 DIAGNOSIS — I739 Peripheral vascular disease, unspecified: Secondary | ICD-10-CM | POA: Diagnosis not present

## 2016-11-24 DIAGNOSIS — M79674 Pain in right toe(s): Secondary | ICD-10-CM | POA: Diagnosis not present

## 2016-11-24 DIAGNOSIS — B351 Tinea unguium: Secondary | ICD-10-CM | POA: Diagnosis not present

## 2016-12-17 ENCOUNTER — Non-Acute Institutional Stay (SKILLED_NURSING_FACILITY): Payer: Medicare Other | Admitting: Adult Health

## 2016-12-17 DIAGNOSIS — I10 Essential (primary) hypertension: Secondary | ICD-10-CM

## 2016-12-17 DIAGNOSIS — F54 Psychological and behavioral factors associated with disorders or diseases classified elsewhere: Secondary | ICD-10-CM

## 2016-12-17 DIAGNOSIS — D649 Anemia, unspecified: Secondary | ICD-10-CM

## 2016-12-17 DIAGNOSIS — F79 Unspecified intellectual disabilities: Secondary | ICD-10-CM

## 2016-12-17 DIAGNOSIS — N400 Enlarged prostate without lower urinary tract symptoms: Secondary | ICD-10-CM | POA: Diagnosis not present

## 2016-12-17 DIAGNOSIS — R1314 Dysphagia, pharyngoesophageal phase: Secondary | ICD-10-CM | POA: Diagnosis not present

## 2016-12-17 DIAGNOSIS — R631 Polydipsia: Secondary | ICD-10-CM

## 2017-01-14 ENCOUNTER — Encounter: Payer: Self-pay | Admitting: Adult Health

## 2017-01-14 ENCOUNTER — Non-Acute Institutional Stay (SKILLED_NURSING_FACILITY): Payer: Medicare Other | Admitting: Adult Health

## 2017-01-14 DIAGNOSIS — E871 Hypo-osmolality and hyponatremia: Secondary | ICD-10-CM

## 2017-01-14 DIAGNOSIS — R1314 Dysphagia, pharyngoesophageal phase: Secondary | ICD-10-CM

## 2017-01-14 DIAGNOSIS — R569 Unspecified convulsions: Secondary | ICD-10-CM | POA: Diagnosis not present

## 2017-01-14 DIAGNOSIS — I10 Essential (primary) hypertension: Secondary | ICD-10-CM | POA: Diagnosis not present

## 2017-01-14 DIAGNOSIS — R631 Polydipsia: Secondary | ICD-10-CM

## 2017-01-14 DIAGNOSIS — D649 Anemia, unspecified: Secondary | ICD-10-CM | POA: Diagnosis not present

## 2017-01-14 DIAGNOSIS — N400 Enlarged prostate without lower urinary tract symptoms: Secondary | ICD-10-CM

## 2017-01-14 DIAGNOSIS — F54 Psychological and behavioral factors associated with disorders or diseases classified elsewhere: Secondary | ICD-10-CM

## 2017-01-14 NOTE — Progress Notes (Signed)
Location:   [place] Room Number: 143 A Place of Service:  SNF (31)   CODE STATUS: Full Code  No Known Allergies  Chief Complaint  Patient presents with  . Medical Management of Chronic Issues    Routine Visit    HPI:  He is a long term resident of this facility being seen for the management of his chronic illnesses. Overall his status is stable. He does get out of bed daily and wheels himself throughout the facility. He is unable to fully participate in the hpi or ros. There are no nursing concerns at this time.    Past Medical History:  Diagnosis Date  . Altered mental status   . B12 deficiency 01/14/2012  . B12 deficiency anemia   . BPH (benign prostatic hypertrophy)   . Cataract   . Chronic hyponatremia    [name] 01/13/2012  . Dysphagia   . Hypertension   . Mental retardation   . Pneumonia   . Psychogenic polydipsia   . Seizures ([place])     Past Surgical History:  Procedure Laterality Date  . NO PAST SURGERIES      Social History   Social History  . Marital status: Single    Spouse name: N/A  . Number of children: N/A  . Years of education: N/A   Occupational History  . Not on file.   Social History Main Topics  . Smoking status: Former Research scientist (life sciences)  . Smokeless tobacco: Never Used  . Alcohol use No  . Drug use: No  . Sexual activity: Not on file   Other Topics Concern  . Not on file   Social History Narrative  . No narrative on file   Family History  Problem Relation Age of Onset  . Family history unknown: Yes      VITAL SIGNS BP 112/75   Pulse 76   Temp (!) 96.6 F (35.9 C)   Resp 18   Ht 5\' 5"  (1.651 m)   Wt 158 lb 1.6 oz (71.7 kg)   SpO2 97%   BMI 26.31 kg/m   Patient's Medications  New Prescriptions   No medications on file  Previous Medications   ACETAMINOPHEN (TYLENOL) 325 MG TABLET    Take 650 mg by mouth every 6 (six) hours as needed. pain   ASPIRIN 81 MG CHEWABLE TABLET    Chew 81 mg by mouth daily.   LATANOPROST (XALATAN) 0.005 % OPHTHALMIC SOLUTION    Place 1 drop into both eyes at bedtime.   MAGNESIUM HYDROXIDE (MILK OF MAGNESIA) 400 MG/5ML SUSPENSION    Take 30 mLs by mouth daily as needed. For constipation   METOPROLOL TARTRATE 75 MG TABS    Take 75 mg by mouth 2 (two) times daily.   SODIUM CHLORIDE 1 G TABLET    Take 1 g by mouth every morning.   TAMSULOSIN HCL (FLOMAX) 0.4 MG CAPS    Take 0.4 mg by mouth daily.    UNABLE TO FIND    Med Name: Frozen magic cup by mouth three times daily  Modified Medications   No medications on file  Discontinued Medications   No medications on file     SIGNIFICANT DIAGNOSTIC EXAMS  09-17-14: chest x-ray: no active disease seen in chest   LABS REVIEWED;   04-17-16: wbc 6.3; hgb 14.0; hct 41.9;mcv 90.7 plt 233; glucose 80; bun 11; creat 0.77; k+ 4.3; na++ 137; liver normal albumin 3.5  04-22-16: wbc 7.7; hgb 13.4; hct 39.7;  mcv 89.0; pt 202; glucose 82; bun 17; creat 0.88; k+ 4.5; na++ 139 liver normal albumin 3.3  11-11-16: wbc 7.3; hgb 13.3; hct 40.6; mcv 92.5; plt 203; glucose 88; bun 10.9; creat 0.67; k+ 4.8; na++ 135; liver normal albumin 3.6     Review of Systems Unable to perform ROS: Other      Physical Exam Constitutional: No distress. frail  Eyes: Conjunctivae are normal.  Neck: Neck supple. No JVD present. No thyromegaly present.  Cardiovascular: Normal rate, regular rhythm and intact distal pulses.   Respiratory: lungs clear  Difficult to hear-does not take deep breath   GI: Soft. Bowel sounds are normal. He exhibits no distension. There is no tenderness.  Musculoskeletal:  Able to move all extremities Has trace bilateral lower extremity edema    Lymphadenopathy:    He has no cervical adenopathy.  Neurological: He is alert.  Skin: Skin is warm and dry. He is not diaphoretic.  Psychiatric: He has a normal mood and affect    ASSESSMENT/ PLAN:  1.  Hypertension: is stable will continue lopressor 75 mg twice daily and will  monitor  2.  Dysphagia: no signs of aspiration present; is on thin liquids  will monitor his status.   3. Hyponatremia: has psychogenic polydipsia:  is chronic: is on nacl 1 gm daily with a na++ of 135; will not make changes will monitor  4. BPH: will continue flomax 0.4 mg  daily  5. Seizure: no reports of seizure activity present. Is presently not on medications; will continue to monitor his status.   6. Anemia: is stable hgb is 13.3  will monitor    MD is aware of resident's narcotic use and is in agreement with current plan of care. We will attempt to wean resident as apropriate     [name] NP [facility]  Contact [phone] Monday through Friday 8am- 5pm  After hours call [phone]

## 2017-01-18 NOTE — Progress Notes (Signed)
Location:   Psychiatrist of Service:  SNF (31)   CODE STATUS: full code   No Known Allergies  Chief Complaint  Patient presents with  . Medical Management of Chronic Issues    HPI:  He is a long term resident of this facility being seen for the management of his chronic illnesses. Overall his status is stable. He does get out of bed daily and propels himself throughout the facility. He is unable to fully participate in the hpi or ros. There are no nursing concerns at this time.    Past Medical History:  Diagnosis Date  . Altered mental status   . B12 deficiency 01/14/2012  . B12 deficiency anemia   . BPH (benign prostatic hypertrophy)   . Cataract   . Chronic hyponatremia    [name] 01/13/2012  . Dysphagia   . Hypertension   . Mental retardation   . Pneumonia   . Psychogenic polydipsia   . Seizures ([place])     Past Surgical History:  Procedure Laterality Date  . NO PAST SURGERIES      Social History   Social History  . Marital status: Single    Spouse name: N/A  . Number of children: N/A  . Years of education: N/A   Occupational History  . Not on file.   Social History Main Topics  . Smoking status: Former Research scientist (life sciences)  . Smokeless tobacco: Never Used  . Alcohol use No  . Drug use: No  . Sexual activity: Not on file   Other Topics Concern  . Not on file   Social History Narrative  . No narrative on file   Family History  Problem Relation Age of Onset  . Family history unknown: Yes      VITAL SIGNS BP (!) 144/70   Pulse 70   Temp 98.7 F (37.1 C)   Resp 20   Ht 5\' 5"  (1.651 m)   Wt 158 lb (71.7 kg)   SpO2 97%   BMI 26.29 kg/m   Patient's Medications  New Prescriptions   No medications on file  Previous Medications   ACETAMINOPHEN (TYLENOL) 325 MG TABLET    Take 650 mg by mouth every 6 (six) hours as needed. pain   ASPIRIN 81 MG CHEWABLE TABLET    Chew 81 mg by mouth daily.   LATANOPROST (XALATAN) 0.005 % OPHTHALMIC SOLUTION     Place 1 drop into both eyes at bedtime.   MAGNESIUM HYDROXIDE (MILK OF MAGNESIA) 400 MG/5ML SUSPENSION    Take 30 mLs by mouth daily as needed. For constipation   METOPROLOL TARTRATE 75 MG TABS    Take 75 mg by mouth 2 (two) times daily.   SODIUM CHLORIDE 1 G TABLET    Take 1 g by mouth every morning.   TAMSULOSIN HCL (FLOMAX) 0.4 MG CAPS    Take 0.4 mg by mouth daily.    UNABLE TO FIND    Med Name: Frozen magic cup by mouth three times daily  Modified Medications   No medications on file  Discontinued Medications   No medications on file     SIGNIFICANT DIAGNOSTIC EXAMS  09-17-14: chest x-ray: no active disease seen in chest   LABS REVIEWED;   04-17-16: wbc 6.3; hgb 14.0; hct 41.9;mcv 90.7 plt 233; glucose 80; bun 11; creat 0.77; k+ 4.3; na++ 137; liver normal albumin 3.5  04-22-16: wbc 7.7; hgb 13.4; hct 39.7; mcv 89.0; pt 202; glucose 82; bun  17; creat 0.88; k+ 4.5; na++ 139 liver normal albumin 3.3  11-11-16: wbc 7.3; hgb 13.3; hct 40.6; mcv 92.5; plt 203; glucose 88; bun 10.9; creat 0.76; k+ 4.8; na++ 135; liver normal albumin 3.6     Review of Systems Unable to perform ROS: Other      Physical Exam Constitutional: No distress. frail  Eyes: Conjunctivae are normal.  Neck: Neck supple. No JVD present. No thyromegaly present.  Cardiovascular: Normal rate, regular rhythm and intact distal pulses.   Respiratory: lungs clear  Difficult to hear-does not take deep breath   GI: Soft. Bowel sounds are normal. He exhibits no distension. There is no tenderness.  Musculoskeletal:  Able to move all extremities Has trace bilateral lower extremity edema    Lymphadenopathy:    He has no cervical adenopathy.  Neurological: He is alert.  Skin: Skin is warm and dry. He is not diaphoretic.  Psychiatric: He has a normal mood and affect    ASSESSMENT/ PLAN:  1.  Hypertension: is stable will continue lopressor 75 mg twice daily and will monitor  2.  Dysphagia: no signs of aspiration  present; is on thin liquids  will monitor his status.   3. Hyponatremia: has psychogenic polydipsia:  is chronic: is on nacl 1 gm daily with a na++ of 135; will not make changes will monitor  4. BPH: will continue flomax 0.4 mg  daily  5. Seizure: no reports of seizure activity present. Is presently not on medications; will continue to monitor his status.   6. Anemia: is stable hgb is 13.3  will monitor     MD is aware of resident's narcotic use and is in agreement with current plan of care. We will attempt to wean resident as apropriate   [name] NP [facility]  Contact [phone] Monday through Friday 8am- 5pm  After hours call [phone]

## 2017-02-15 ENCOUNTER — Encounter: Payer: Self-pay | Admitting: Adult Health

## 2017-02-15 ENCOUNTER — Non-Acute Institutional Stay (SKILLED_NURSING_FACILITY): Payer: Medicare Other | Admitting: Adult Health

## 2017-02-15 DIAGNOSIS — E871 Hypo-osmolality and hyponatremia: Secondary | ICD-10-CM | POA: Diagnosis not present

## 2017-02-15 DIAGNOSIS — R1314 Dysphagia, pharyngoesophageal phase: Secondary | ICD-10-CM

## 2017-02-15 DIAGNOSIS — R569 Unspecified convulsions: Secondary | ICD-10-CM

## 2017-02-15 DIAGNOSIS — I10 Essential (primary) hypertension: Secondary | ICD-10-CM | POA: Diagnosis not present

## 2017-02-15 DIAGNOSIS — F54 Psychological and behavioral factors associated with disorders or diseases classified elsewhere: Secondary | ICD-10-CM | POA: Diagnosis not present

## 2017-02-15 DIAGNOSIS — D649 Anemia, unspecified: Secondary | ICD-10-CM | POA: Diagnosis not present

## 2017-02-15 DIAGNOSIS — R631 Polydipsia: Secondary | ICD-10-CM

## 2017-02-15 NOTE — Progress Notes (Signed)
Location:   [facility] Room Number: 143 A Place of Service:  SNF (31)   CODE STATUS: Full Code  No Known Allergies  Chief Complaint  Patient presents with  . Medical Management of Chronic Issues    1 month follow up    HPI:  He is a long term resident of this facility being seen for the management of his chronic illnesses. Overall his status is stable. He does get out of bed daily and propels himself throughout the facility. He is unable to fully participate in the hpi or ros. There are no nursing concerns at this time,    Past Medical History:  Diagnosis Date  . Altered mental status   . B12 deficiency 01/14/2012  . B12 deficiency anemia   . BPH (benign prostatic hypertrophy)   . Cataract   . Chronic hyponatremia    [name] 01/13/2012  . Dysphagia   . Hypertension   . Mental retardation   . Pneumonia   . Psychogenic polydipsia   . Seizures ([place])     Past Surgical History:  Procedure Laterality Date  . NO PAST SURGERIES      Social History   Social History  . Marital status: Single    Spouse name: N/A  . Number of children: N/A  . Years of education: N/A   Occupational History  . Not on file.   Social History Main Topics  . Smoking status: Former Research scientist (life sciences)  . Smokeless tobacco: Never Used  . Alcohol use No  . Drug use: No  . Sexual activity: Not on file   Other Topics Concern  . Not on file   Social History Narrative  . No narrative on file   Family History  Problem Relation Age of Onset  . Family history unknown: Yes      VITAL SIGNS BP 132/76   Pulse 76   Temp (!) 96.6 F (35.9 C)   Resp 18   Ht 5\' 5"  (1.651 m)   Wt 152 lb 3.2 oz (69 kg)   SpO2 97%   BMI 25.33 kg/m   Patient's Medications  New Prescriptions   No medications on file  Previous Medications   ACETAMINOPHEN (TYLENOL) 325 MG TABLET    Take 650 mg by mouth every 6 (six) hours as needed. pain   ASPIRIN 81 MG CHEWABLE TABLET    Chew 81 mg by mouth daily.   LATANOPROST (XALATAN) 0.005 % OPHTHALMIC SOLUTION    Place 1 drop into both eyes at bedtime.   MAGNESIUM HYDROXIDE (MILK OF MAGNESIA) 400 MG/5ML SUSPENSION    Take 30 mLs by mouth daily as needed. For constipation   METOPROLOL TARTRATE 75 MG TABS    Take 75 mg by mouth 2 (two) times daily.   SODIUM CHLORIDE 1 G TABLET    Take 1 g by mouth every morning.   TAMSULOSIN HCL (FLOMAX) 0.4 MG CAPS    Take 0.4 mg by mouth daily.    UNABLE TO FIND    Med Name: Frozen magic cup by mouth three times daily  Modified Medications   No medications on file  Discontinued Medications   No medications on file     SIGNIFICANT DIAGNOSTIC EXAMS  09-17-14: chest x-ray: no active disease seen in chest   LABS REVIEWED;   04-17-16: wbc 6.3; hgb 14.0; hct 41.9;mcv 90.7 plt 233; glucose 80; bun 11; creat 0.77; k+ 4.3; na++ 137; liver normal albumin 3.5  04-22-16: wbc 7.7; hgb  13.4; hct 39.7; mcv 89.0; pt 202; glucose 82; bun 17; creat 0.88; k+ 4.5; na++ 139 liver normal albumin 3.3  11-11-16: wbc 7.3; hgb 13.3; hct 40.6; mcv 92.5; plt 203; glucose 88; bun 10.9; creat 0.67; k+ 4.8; na++ 135; liver normal albumin 3.6     Review of Systems Unable to perform ROS: Other      Physical Exam Constitutional: No distress. frail  Eyes: Conjunctivae are normal.  Neck: Neck supple. No JVD present. No thyromegaly present.  Cardiovascular: Normal rate, regular rhythm and intact distal pulses.   Respiratory: lungs clear  Difficult to hear-does not take deep breath   GI: Soft. Bowel sounds are normal. He exhibits no distension. There is no tenderness.  Musculoskeletal:  Able to move all extremities Has trace bilateral lower extremity edema    Lymphadenopathy:    He has no cervical adenopathy.  Neurological: He is alert.  Skin: Skin is warm and dry. He is not diaphoretic.  Psychiatric: He has a normal mood and affect    ASSESSMENT/ PLAN:  1.  Hypertension: is stable will continue lopressor 75 mg twice daily and will  monitor  2.  Dysphagia: no signs of aspiration present; is on thin liquids  will monitor his status.   3. Hyponatremia: has psychogenic polydipsia:  is chronic: is on nacl 1 gm daily with a na++ of 135; will not make changes will monitor  4. BPH: will continue flomax 0.4 mg  daily  5. Seizure: no reports of seizure activity present. Is presently not on medications; will continue to monitor his status.   6. Anemia: is stable hgb is 13.3  will monitor   MD is aware of resident's narcotic use and is in agreement with current plan of care. We will attempt to wean resident as apropriate     [name] NP [facility]  Contact [phone] Monday through Friday 8am- 5pm  After hours call [phone]

## 2017-03-18 ENCOUNTER — Non-Acute Institutional Stay (SKILLED_NURSING_FACILITY): Payer: Medicare Other | Admitting: Adult Health

## 2017-03-18 ENCOUNTER — Encounter: Payer: Self-pay | Admitting: Adult Health

## 2017-03-18 DIAGNOSIS — R569 Unspecified convulsions: Secondary | ICD-10-CM

## 2017-03-18 DIAGNOSIS — F54 Psychological and behavioral factors associated with disorders or diseases classified elsewhere: Secondary | ICD-10-CM | POA: Diagnosis not present

## 2017-03-18 DIAGNOSIS — D649 Anemia, unspecified: Secondary | ICD-10-CM

## 2017-03-18 DIAGNOSIS — R1314 Dysphagia, pharyngoesophageal phase: Secondary | ICD-10-CM | POA: Diagnosis not present

## 2017-03-18 DIAGNOSIS — N400 Enlarged prostate without lower urinary tract symptoms: Secondary | ICD-10-CM

## 2017-03-18 DIAGNOSIS — R631 Polydipsia: Secondary | ICD-10-CM

## 2017-03-18 DIAGNOSIS — I10 Essential (primary) hypertension: Secondary | ICD-10-CM | POA: Diagnosis not present

## 2017-03-18 NOTE — Progress Notes (Signed)
Location:   [place] Room Number: 143 A Place of Service:  SNF (31)   CODE STATUS: Full Code  No Known Allergies  Chief Complaint  Patient presents with  . Medical Management of Chronic Issues    1 month follow up    HPI:  He is long term resident of this facility being seen for the management of his chronic illnesses. He is unable to fully participate in the hpi or ros. He is up and about in his wheelchair. There are no nursing concerns at this time.    Past Medical History:  Diagnosis Date  . Altered mental status   . B12 deficiency 01/14/2012  . B12 deficiency anemia   . BPH (benign prostatic hypertrophy)   . Cataract   . Chronic hyponatremia    [name] 01/13/2012  . Dysphagia   . Hypertension   . Mental retardation   . Pneumonia   . Psychogenic polydipsia   . Seizures ([place])     Past Surgical History:  Procedure Laterality Date  . NO PAST SURGERIES      Social History   Social History  . Marital status: Single    Spouse name: N/A  . Number of children: N/A  . Years of education: N/A   Occupational History  . Not on file.   Social History Main Topics  . Smoking status: Former Research scientist (life sciences)  . Smokeless tobacco: Never Used  . Alcohol use No  . Drug use: No  . Sexual activity: Not on file   Other Topics Concern  . Not on file   Social History Narrative  . No narrative on file   Family History  Problem Relation Age of Onset  . Family history unknown: Yes      VITAL SIGNS BP (!) 142/84   Pulse 88   Temp 97.4 F (36.3 C)   Resp 18   Ht 5\' 5"  (1.651 m)   Wt 152 lb 3 oz (69 kg)   SpO2 96% Comment: room air  BMI 25.33 kg/m   Patient's Medications  New Prescriptions   No medications on file  Previous Medications   ACETAMINOPHEN (TYLENOL) 325 MG TABLET    Take 650 mg by mouth every 6 (six) hours as needed. pain   ASPIRIN 81 MG CHEWABLE TABLET    Chew 81 mg by mouth daily.   LATANOPROST (XALATAN) 0.005 % OPHTHALMIC SOLUTION     Place 1 drop into both eyes at bedtime.   MAGNESIUM HYDROXIDE (MILK OF MAGNESIA) 400 MG/5ML SUSPENSION    Take 30 mLs by mouth daily as needed. For constipation   METOPROLOL TARTRATE 75 MG TABS    Take 75 mg by mouth 2 (two) times daily.   SODIUM CHLORIDE 1 G TABLET    Take 1 g by mouth every morning.   TAMSULOSIN HCL (FLOMAX) 0.4 MG CAPS    Take 0.4 mg by mouth daily.    UNABLE TO FIND    Med Name: Frozen magic cup by mouth three times daily  Modified Medications   No medications on file  Discontinued Medications   No medications on file     SIGNIFICANT DIAGNOSTIC EXAMS  09-17-14: chest x-ray: no active disease seen in chest   LABS REVIEWED;   04-17-16: wbc 6.3; hgb 14.0; hct 41.9;mcv 90.7 plt 233; glucose 80; bun 11; creat 0.77; k+ 4.3; na++ 137; liver normal albumin 3.5  04-22-16: wbc 7.7; hgb 13.4; hct 39.7; mcv 89.0; pt 202; glucose 82;  bun 17; creat 0.88; k+ 4.5; na++ 139 liver normal albumin 3.3  11-11-16: wbc 7.3; hgb 13.3; hct 40.6; mcv 92.5; plt 203; glucose 88; bun 10.9; creat 0.67; k+ 4.8; na++ 135; liver normal albumin 3.6     Review of Systems Unable to perform ROS: Other      Physical Exam Constitutional: No distress. frail  Eyes: Conjunctivae are normal.  Neck: Neck supple. No JVD present. No thyromegaly present.  Cardiovascular: Normal rate, regular rhythm and intact distal pulses.   Respiratory: lungs clear  Difficult to hear-does not take deep breath   GI: Soft. Bowel sounds are normal. He exhibits no distension. There is no tenderness.  Musculoskeletal:  Able to move all extremities Has trace bilateral lower extremity edema    Lymphadenopathy:    He has no cervical adenopathy.  Neurological: He is alert.  Skin: Skin is warm and dry. He is not diaphoretic.  Psychiatric: He has a normal mood and affect    ASSESSMENT/ PLAN:  1.  Hypertension: b/p 142/84  is stable will continue lopressor 75 mg twice daily and will monitor  2.  Dysphagia: no signs of  aspiration present; is on thin liquids  will monitor his status.   3. Hyponatremia: has psychogenic polydipsia:  is chronic: is on nacl 1 gm daily with a na++ of 135; will not make changes will monitor  4. BPH: will continue flomax 0.4 mg  daily  5. Seizure: no reports of seizure activity present. Is presently not on medications; will continue to monitor his status.   6. Anemia: is stable hgb is 13.3  will monitor   Will check cbc; cmp    [name] NP [facility]  Contact [phone] Monday through Friday 8am- 5pm  After hours call [phone]

## 2017-03-19 DIAGNOSIS — E860 Dehydration: Secondary | ICD-10-CM | POA: Diagnosis not present

## 2017-03-19 DIAGNOSIS — I509 Heart failure, unspecified: Secondary | ICD-10-CM | POA: Diagnosis not present

## 2017-03-19 DIAGNOSIS — Z79899 Other long term (current) drug therapy: Secondary | ICD-10-CM | POA: Diagnosis not present

## 2017-03-19 DIAGNOSIS — D649 Anemia, unspecified: Secondary | ICD-10-CM | POA: Diagnosis not present

## 2017-03-19 LAB — CBC AND DIFFERENTIAL
HCT: 43 % (ref 41–53)
Hemoglobin: 13.7 g/dL (ref 13.5–17.5)
NEUTROS ABS: 4 /uL
Platelets: 194 10*3/uL (ref 150–399)
WBC: 7.1 10^3/mL

## 2017-03-19 LAB — BASIC METABOLIC PANEL
BUN: 9 mg/dL (ref 4–21)
CREATININE: 0.7 mg/dL (ref 0.6–1.3)
Glucose: 77 mg/dL
Potassium: 4.3 mmol/L (ref 3.4–5.3)
Sodium: 138 mmol/L (ref 137–147)

## 2017-03-19 LAB — HEPATIC FUNCTION PANEL
ALT: 9 U/L — AB (ref 10–40)
AST: 16 U/L (ref 14–40)
Alkaline Phosphatase: 94 U/L (ref 25–125)
Bilirubin, Total: 0.5 mg/dL

## 2017-04-22 ENCOUNTER — Non-Acute Institutional Stay (SKILLED_NURSING_FACILITY): Payer: Medicare Other

## 2017-04-22 DIAGNOSIS — Z0000 Encounter for general adult medical examination without abnormal findings: Secondary | ICD-10-CM | POA: Diagnosis not present

## 2017-04-22 NOTE — Progress Notes (Signed)
Subjective:   Jesus Ramsey is a 83 y.o. male who presents for an Initial Medicare Annual Wellness Visit at [facility]; incapacitated patient and unable to answer appropriately   Objective:    Today's Vitals   04/22/17 1157  BP: 110/60  Pulse: 82  Temp: 97.9 F (36.6 C)  TempSrc: Oral  SpO2: 96%  Weight: 152 lb (68.9 kg)  Height: 5\' 5"  (1.651 m)   Body mass index is 25.29 kg/m.  Current Medications (verified) Outpatient Encounter Prescriptions as of 04/22/2017  Medication Sig  . acetaminophen (TYLENOL) 325 MG tablet Take 650 mg by mouth every 6 (six) hours as needed. pain  . aspirin 81 MG chewable tablet Chew 81 mg by mouth daily.  Marland Kitchen latanoprost (XALATAN) 0.005 % ophthalmic solution Place 1 drop into both eyes at bedtime.  . magnesium hydroxide (MILK OF MAGNESIA) 400 MG/5ML suspension Take 30 mLs by mouth daily as needed. For constipation  . Metoprolol Tartrate 75 MG TABS Take 75 mg by mouth 2 (two) times daily.  . sodium chloride 1 G tablet Take 1 g by mouth every morning.  . Tamsulosin HCl (FLOMAX) 0.4 MG CAPS Take 0.4 mg by mouth daily.   Marland Kitchen UNABLE TO FIND Med Name: Frozen magic cup by mouth three times daily   No facility-administered encounter medications on file as of 04/22/2017.     Allergies (verified) Patient has no known allergies.   History: Past Medical History:  Diagnosis Date  . Altered mental status   . B12 deficiency 01/14/2012  . B12 deficiency anemia   . BPH (benign prostatic hypertrophy)   . Cataract   . Chronic hyponatremia    [name] 01/13/2012  . Dysphagia   . Hypertension   . Mental retardation   . Pneumonia   . Psychogenic polydipsia   . Seizures ([place])    Past Surgical History:  Procedure Laterality Date  . NO PAST SURGERIES     Family History  Problem Relation Age of Onset  . Family history unknown: Yes   Social History   Occupational History  . Not on file.   Social History Main Topics  . Smoking status: Former  Research scientist (life sciences)  . Smokeless tobacco: Never Used  . Alcohol use No  . Drug use: No  . Sexual activity: Not on file   Tobacco Counseling Counseling given: Not Answered   Activities of Daily Living In your present state of health, do you have any difficulty performing the following activities: 04/22/2017  Hearing? N  Vision? Y  Difficulty concentrating or making decisions? Y  Walking or climbing stairs? Y  Dressing or bathing? Y  Doing errands, shopping? Y  Preparing Food and eating ? Y  Using the Toilet? Y  In the past six months, have you accidently leaked urine? Y  Do you have problems with loss of bowel control? Y  Managing your Medications? Y  Managing your Finances? Y  Housekeeping or managing your Housekeeping? Y  Some recent data might be hidden    Immunizations and Health Maintenance Immunization History  Administered Date(s) Administered  . Influenza-Unspecified 08/22/2014, 09/10/2016  . PPD Test 08/23/2014   There are no preventive care reminders to display for this patient.  Patient Care Team: [facility] [place] ([place])  Indicate any recent Medical Services you may have received from other than [facility] providers in the past year (date may be approximate).    Assessment:   This is a routine wellness examination  for [place].   Hearing/Vision screen No exam data present  Dietary issues and exercise activities discussed: Current Exercise Habits: The patient does not participate in regular exercise at present, Exercise limited by: neurologic condition(s)  Goals    None     Depression Screen PHQ 2/9 Scores 04/22/2017 09/02/2015  PHQ - 2 Score 0 -  Exception Documentation - Other- indicate reason in comment box  Not completed - unable to participate with exam     Fall Risk Fall Risk  04/22/2017 09/02/2015  Falls in the past year? Yes No  Number falls in past yr: 1 -  Injury with Fall? No -    Cognitive Function:     6CIT Screen  04/22/2017  What Year? 4 points  What month? 3 points  What time? 3 points  Count back from 20 4 points  Months in reverse 4 points  Repeat phrase 10 points  Total Score 28    Screening Tests Health Maintenance  Topic Date Due  . INFLUENZA VACCINE  06/09/2017        Plan:    I have personally reviewed and addressed the Medicare Annual Wellness questionnaire and have noted the following in the patient's chart:  A. Medical and social history B. Use of alcohol, tobacco or illicit drugs  C. Current medications and supplements D. Functional ability and status E.  Nutritional status F.  Physical activity G. Advance directives H. List of other physicians I.  Hospitalizations, surgeries, and ER visits in previous 12 months J.  Golf to include hearing, vision, cognitive, depression L. Referrals and appointments - none  In addition, I have reviewed and discussed with patient certain preventive protocols, quality metrics, and best practice recommendations. A written personalized care plan for preventive services as well as general preventive health recommendations were provided to patient.  See attached scanned questionnaire for additional information.   Signed,   Rich Reining, RN Nurse Health Advisor   Quick Notes   Health Maintenance: [phone] due     Abnormal Screen: 6 CIT-28     Patient Concerns: None     Nurse Concerns: None

## 2017-04-22 NOTE — Patient Instructions (Signed)
Mr. Jesus Ramsey , Thank you for taking time to come for your Medicare Wellness Visit. I appreciate your ongoing commitment to your health goals. Please review the following plan we discussed and let me know if I can assist you in the future.   Screening recommendations/referrals: Colonoscopy up to date, pt over age 81 Recommended yearly ophthalmology/optometry visit for glaucoma screening and checkup Recommended yearly dental visit for hygiene and checkup  Vaccinations: Influenza vaccine due when available Pneumococcal vaccine 13 due Tdap vaccine due Shingles vaccine not in records    Advanced directives: Need a copy for chart  Conditions/risks identified: none  Next appointment: None upcoming  Preventive Care 19 Years and Older, Male Preventive care refers to lifestyle choices and visits with your health care provider that can promote health and wellness. What does preventive care include?  A yearly physical exam. This is also called an annual well check.  Dental exams once or twice a year.  Routine eye exams. Ask your health care provider how often you should have your eyes checked.  Personal lifestyle choices, including:  Daily care of your teeth and gums.  Regular physical activity.  Eating a healthy diet.  Avoiding tobacco and drug use.  Limiting alcohol use.  Practicing safe sex.  Taking low doses of aspirin every day.  Taking vitamin and mineral supplements as recommended by your health care provider. What happens during an annual well check? The services and screenings done by your health care provider during your annual well check will depend on your age, overall health, lifestyle risk factors, and family history of disease. Counseling  Your health care provider may ask you questions about your:  Alcohol use.  Tobacco use.  Drug use.  Emotional well-being.  Home and relationship well-being.  Sexual activity.  Eating habits.  History of  falls.  Memory and ability to understand (cognition).  Work and work Statistician. Screening  You may have the following tests or measurements:  Height, weight, and BMI.  Blood pressure.  Lipid and cholesterol levels. These may be checked every 5 years, or more frequently if you are over 41 years old.  Skin check.  Lung cancer screening. You may have this screening every year starting at age 28 if you have a 30-pack-year history of smoking and currently smoke or have quit within the past 15 years.  Fecal occult blood test (FOBT) of the stool. You may have this test every year starting at age 62.  Flexible sigmoidoscopy or colonoscopy. You may have a sigmoidoscopy every 5 years or a colonoscopy every 10 years starting at age 29.  Prostate cancer screening. Recommendations will vary depending on your family history and other risks.  Hepatitis C blood test.  Hepatitis B blood test.  Sexually transmitted disease (STD) testing.  Diabetes screening. This is done by checking your blood sugar (glucose) after you have not eaten for a while (fasting). You may have this done every 1-3 years.  Abdominal aortic aneurysm (AAA) screening. You may need this if you are a current or former smoker.  Osteoporosis. You may be screened starting at age 69 if you are at high risk. Talk with your health care provider about your test results, treatment options, and if necessary, the need for more tests. Vaccines  Your health care provider may recommend certain vaccines, such as:  Influenza vaccine. This is recommended every year.  Tetanus, diphtheria, and acellular pertussis (Tdap, Td) vaccine. You may need a Td booster every 10 years.  Zoster  vaccine. You may need this after age 23.  Pneumococcal 13-valent conjugate (PCV13) vaccine. One dose is recommended after age 41.  Pneumococcal polysaccharide (PPSV23) vaccine. One dose is recommended after age 68. Talk to your health care provider about  which screenings and vaccines you need and how often you need them. This information is not intended to replace advice given to you by your health care provider. Make sure you discuss any questions you have with your health care provider. Document Released: 11/22/2015 Document Revised: 07/15/2016 Document Reviewed: 08/27/2015 Elsevier Interactive Patient Education  2017 [employer] Prevention in the Home Falls can cause injuries. They can happen to people of all ages. There are many things you can do to make your home safe and to help prevent falls. What can I do on the outside of my home?  Regularly fix the edges of walkways and driveways and fix any cracks.  Remove anything that might make you trip as you walk through a door, such as a raised step or threshold.  Trim any bushes or trees on the path to your home.  Use bright outdoor lighting.  Clear any walking paths of anything that might make someone trip, such as rocks or tools.  Regularly check to see if handrails are loose or broken. Make sure that both sides of any steps have handrails.  Any raised decks and porches should have guardrails on the edges.  Have any leaves, snow, or ice cleared regularly.  Use sand or salt on walking paths during winter.  Clean up any spills in your garage right away. This includes oil or grease spills. What can I do in the bathroom?  Use night lights.  Install grab bars by the toilet and in the tub and shower. Do not use towel bars as grab bars.  Use non-skid mats or decals in the tub or shower.  If you need to sit down in the shower, use a plastic, non-slip stool.  Keep the floor dry. Clean up any water that spills on the floor as soon as it happens.  Remove soap buildup in the tub or shower regularly.  Attach bath mats securely with double-sided non-slip rug tape.  Do not have throw rugs and other things on the floor that can make you trip. What can I do in the  bedroom?  Use night lights.  Make sure that you have a light by your bed that is easy to reach.  Do not use any sheets or blankets that are too big for your bed. They should not hang down onto the floor.  Have a firm chair that has side arms. You can use this for support while you get dressed.  Do not have throw rugs and other things on the floor that can make you trip. What can I do in the kitchen?  Clean up any spills right away.  Avoid walking on wet floors.  Keep items that you use a lot in easy-to-reach places.  If you need to reach something above you, use a strong step stool that has a grab bar.  Keep electrical cords out of the way.  Do not use floor polish or wax that makes floors slippery. If you must use wax, use non-skid floor wax.  Do not have throw rugs and other things on the floor that can make you trip. What can I do with my stairs?  Do not leave any items on the stairs.  Make sure that there are handrails  on both sides of the stairs and use them. Fix handrails that are broken or loose. Make sure that handrails are as long as the stairways.  Check any carpeting to make sure that it is firmly attached to the stairs. Fix any carpet that is loose or worn.  Avoid having throw rugs at the top or bottom of the stairs. If you do have throw rugs, attach them to the floor with carpet tape.  Make sure that you have a light switch at the top of the stairs and the bottom of the stairs. If you do not have them, ask someone to add them for you. What else can I do to help prevent falls?  Wear shoes that:  Do not have high heels.  Have rubber bottoms.  Are comfortable and fit you well.  Are closed at the toe. Do not wear sandals.  If you use a stepladder:  Make sure that it is fully opened. Do not climb a closed stepladder.  Make sure that both sides of the stepladder are locked into place.  Ask someone to hold it for you, if possible.  Clearly mark and make  sure that you can see:  Any grab bars or handrails.  First and last steps.  Where the edge of each step is.  Use tools that help you move around (mobility aids) if they are needed. These include:  Canes.  Walkers.  Scooters.  Crutches.  Turn on the lights when you go into a dark area. Replace any light bulbs as soon as they burn out.  Set up your furniture so you have a clear path. Avoid moving your furniture around.  If any of your floors are uneven, fix them.  If there are any pets around you, be aware of where they are.  Review your medicines with your doctor. Some medicines can make you feel dizzy. This can increase your chance of falling. Ask your doctor what other things that you can do to help prevent falls. This information is not intended to replace advice given to you by your health care provider. Make sure you discuss any questions you have with your health care provider. Document Released: 08/22/2009 Document Revised: 04/02/2016 Document Reviewed: 11/30/2014 Elsevier Interactive Patient Education  2017 [employer].

## 2017-05-20 DIAGNOSIS — G459 Transient cerebral ischemic attack, unspecified: Secondary | ICD-10-CM | POA: Diagnosis not present

## 2017-05-20 DIAGNOSIS — N400 Enlarged prostate without lower urinary tract symptoms: Secondary | ICD-10-CM | POA: Diagnosis not present

## 2017-05-20 DIAGNOSIS — I10 Essential (primary) hypertension: Secondary | ICD-10-CM | POA: Diagnosis not present

## 2017-06-03 DIAGNOSIS — R569 Unspecified convulsions: Secondary | ICD-10-CM | POA: Diagnosis not present

## 2017-06-03 DIAGNOSIS — F79 Unspecified intellectual disabilities: Secondary | ICD-10-CM | POA: Diagnosis not present

## 2017-06-03 DIAGNOSIS — I10 Essential (primary) hypertension: Secondary | ICD-10-CM | POA: Diagnosis not present

## 2017-06-03 DIAGNOSIS — F54 Psychological and behavioral factors associated with disorders or diseases classified elsewhere: Secondary | ICD-10-CM | POA: Diagnosis not present

## 2017-06-17 DIAGNOSIS — E538 Deficiency of other specified B group vitamins: Secondary | ICD-10-CM | POA: Diagnosis not present

## 2017-06-17 DIAGNOSIS — D291 Benign neoplasm of prostate: Secondary | ICD-10-CM | POA: Diagnosis not present

## 2017-06-17 DIAGNOSIS — R569 Unspecified convulsions: Secondary | ICD-10-CM | POA: Diagnosis not present

## 2017-06-17 DIAGNOSIS — I10 Essential (primary) hypertension: Secondary | ICD-10-CM | POA: Diagnosis not present

## 2017-06-21 DIAGNOSIS — D649 Anemia, unspecified: Secondary | ICD-10-CM | POA: Diagnosis not present

## 2017-06-21 DIAGNOSIS — E860 Dehydration: Secondary | ICD-10-CM | POA: Diagnosis not present

## 2017-06-21 DIAGNOSIS — I509 Heart failure, unspecified: Secondary | ICD-10-CM | POA: Diagnosis not present

## 2017-06-21 DIAGNOSIS — Z79899 Other long term (current) drug therapy: Secondary | ICD-10-CM | POA: Diagnosis not present

## 2017-07-08 DIAGNOSIS — G459 Transient cerebral ischemic attack, unspecified: Secondary | ICD-10-CM | POA: Diagnosis not present

## 2017-07-08 DIAGNOSIS — I10 Essential (primary) hypertension: Secondary | ICD-10-CM | POA: Diagnosis not present

## 2017-07-08 DIAGNOSIS — F54 Psychological and behavioral factors associated with disorders or diseases classified elsewhere: Secondary | ICD-10-CM | POA: Diagnosis not present

## 2017-07-08 DIAGNOSIS — R569 Unspecified convulsions: Secondary | ICD-10-CM | POA: Diagnosis not present

## 2017-07-16 DIAGNOSIS — E538 Deficiency of other specified B group vitamins: Secondary | ICD-10-CM | POA: Diagnosis not present

## 2017-07-16 DIAGNOSIS — I10 Essential (primary) hypertension: Secondary | ICD-10-CM | POA: Diagnosis not present

## 2017-07-16 DIAGNOSIS — F54 Psychological and behavioral factors associated with disorders or diseases classified elsewhere: Secondary | ICD-10-CM | POA: Diagnosis not present

## 2017-07-16 DIAGNOSIS — R569 Unspecified convulsions: Secondary | ICD-10-CM | POA: Diagnosis not present

## 2017-08-05 DIAGNOSIS — N400 Enlarged prostate without lower urinary tract symptoms: Secondary | ICD-10-CM | POA: Diagnosis not present

## 2017-08-05 DIAGNOSIS — F54 Psychological and behavioral factors associated with disorders or diseases classified elsewhere: Secondary | ICD-10-CM | POA: Diagnosis not present

## 2017-08-05 DIAGNOSIS — I10 Essential (primary) hypertension: Secondary | ICD-10-CM | POA: Diagnosis not present

## 2017-08-05 DIAGNOSIS — I6789 Other cerebrovascular disease: Secondary | ICD-10-CM | POA: Diagnosis not present

## 2017-08-13 DIAGNOSIS — F54 Psychological and behavioral factors associated with disorders or diseases classified elsewhere: Secondary | ICD-10-CM | POA: Diagnosis not present

## 2017-08-13 DIAGNOSIS — R569 Unspecified convulsions: Secondary | ICD-10-CM | POA: Diagnosis not present

## 2017-08-13 DIAGNOSIS — I10 Essential (primary) hypertension: Secondary | ICD-10-CM | POA: Diagnosis not present

## 2017-08-13 DIAGNOSIS — N400 Enlarged prostate without lower urinary tract symptoms: Secondary | ICD-10-CM | POA: Diagnosis not present

## 2017-08-18 DIAGNOSIS — Z23 Encounter for immunization: Secondary | ICD-10-CM | POA: Diagnosis not present

## 2017-09-02 DIAGNOSIS — I6789 Other cerebrovascular disease: Secondary | ICD-10-CM | POA: Diagnosis not present

## 2017-09-02 DIAGNOSIS — L8990 Pressure ulcer of unspecified site, unspecified stage: Secondary | ICD-10-CM | POA: Diagnosis not present

## 2017-09-02 DIAGNOSIS — I10 Essential (primary) hypertension: Secondary | ICD-10-CM | POA: Diagnosis not present

## 2017-09-02 DIAGNOSIS — R569 Unspecified convulsions: Secondary | ICD-10-CM | POA: Diagnosis not present

## 2017-09-10 DIAGNOSIS — I10 Essential (primary) hypertension: Secondary | ICD-10-CM | POA: Diagnosis not present

## 2017-09-10 DIAGNOSIS — R569 Unspecified convulsions: Secondary | ICD-10-CM | POA: Diagnosis not present

## 2017-09-10 DIAGNOSIS — N400 Enlarged prostate without lower urinary tract symptoms: Secondary | ICD-10-CM | POA: Diagnosis not present

## 2017-09-13 DIAGNOSIS — I10 Essential (primary) hypertension: Secondary | ICD-10-CM | POA: Diagnosis not present

## 2017-09-13 DIAGNOSIS — E785 Hyperlipidemia, unspecified: Secondary | ICD-10-CM | POA: Diagnosis not present

## 2017-09-13 DIAGNOSIS — D649 Anemia, unspecified: Secondary | ICD-10-CM | POA: Diagnosis not present

## 2017-09-17 DIAGNOSIS — N400 Enlarged prostate without lower urinary tract symptoms: Secondary | ICD-10-CM | POA: Diagnosis not present

## 2017-09-17 DIAGNOSIS — R293 Abnormal posture: Secondary | ICD-10-CM | POA: Diagnosis not present

## 2017-09-24 DIAGNOSIS — R293 Abnormal posture: Secondary | ICD-10-CM | POA: Diagnosis not present

## 2017-09-24 DIAGNOSIS — N400 Enlarged prostate without lower urinary tract symptoms: Secondary | ICD-10-CM | POA: Diagnosis not present

## 2017-09-28 DIAGNOSIS — N400 Enlarged prostate without lower urinary tract symptoms: Secondary | ICD-10-CM | POA: Diagnosis not present

## 2017-09-28 DIAGNOSIS — R293 Abnormal posture: Secondary | ICD-10-CM | POA: Diagnosis not present

## 2017-09-29 DIAGNOSIS — I10 Essential (primary) hypertension: Secondary | ICD-10-CM | POA: Diagnosis not present

## 2017-09-29 DIAGNOSIS — I6789 Other cerebrovascular disease: Secondary | ICD-10-CM | POA: Diagnosis not present

## 2017-09-29 DIAGNOSIS — G459 Transient cerebral ischemic attack, unspecified: Secondary | ICD-10-CM | POA: Diagnosis not present

## 2017-09-29 DIAGNOSIS — R569 Unspecified convulsions: Secondary | ICD-10-CM | POA: Diagnosis not present

## 2017-10-05 DIAGNOSIS — R293 Abnormal posture: Secondary | ICD-10-CM | POA: Diagnosis not present

## 2017-10-05 DIAGNOSIS — N400 Enlarged prostate without lower urinary tract symptoms: Secondary | ICD-10-CM | POA: Diagnosis not present

## 2017-10-11 DIAGNOSIS — N400 Enlarged prostate without lower urinary tract symptoms: Secondary | ICD-10-CM | POA: Diagnosis not present

## 2017-10-11 DIAGNOSIS — I10 Essential (primary) hypertension: Secondary | ICD-10-CM | POA: Diagnosis not present

## 2017-10-11 DIAGNOSIS — R569 Unspecified convulsions: Secondary | ICD-10-CM | POA: Diagnosis not present

## 2017-10-11 DIAGNOSIS — F54 Psychological and behavioral factors associated with disorders or diseases classified elsewhere: Secondary | ICD-10-CM | POA: Diagnosis not present

## 2017-10-26 DIAGNOSIS — B351 Tinea unguium: Secondary | ICD-10-CM | POA: Diagnosis not present

## 2017-10-26 DIAGNOSIS — I10 Essential (primary) hypertension: Secondary | ICD-10-CM | POA: Diagnosis not present

## 2017-10-26 DIAGNOSIS — I739 Peripheral vascular disease, unspecified: Secondary | ICD-10-CM | POA: Diagnosis not present

## 2017-10-26 DIAGNOSIS — F54 Psychological and behavioral factors associated with disorders or diseases classified elsewhere: Secondary | ICD-10-CM | POA: Diagnosis not present

## 2017-10-26 DIAGNOSIS — R262 Difficulty in walking, not elsewhere classified: Secondary | ICD-10-CM | POA: Diagnosis not present

## 2017-10-26 DIAGNOSIS — I6789 Other cerebrovascular disease: Secondary | ICD-10-CM | POA: Diagnosis not present

## 2017-11-08 DIAGNOSIS — F79 Unspecified intellectual disabilities: Secondary | ICD-10-CM | POA: Diagnosis not present

## 2017-11-08 DIAGNOSIS — N400 Enlarged prostate without lower urinary tract symptoms: Secondary | ICD-10-CM | POA: Diagnosis not present

## 2017-11-08 DIAGNOSIS — I10 Essential (primary) hypertension: Secondary | ICD-10-CM | POA: Diagnosis not present

## 2017-11-08 DIAGNOSIS — R569 Unspecified convulsions: Secondary | ICD-10-CM | POA: Diagnosis not present

## 2017-11-23 DIAGNOSIS — H01024 Squamous blepharitis left upper eyelid: Secondary | ICD-10-CM | POA: Diagnosis not present

## 2017-11-23 DIAGNOSIS — H25813 Combined forms of age-related cataract, bilateral: Secondary | ICD-10-CM | POA: Diagnosis not present

## 2017-11-23 DIAGNOSIS — H04123 Dry eye syndrome of bilateral lacrimal glands: Secondary | ICD-10-CM | POA: Diagnosis not present

## 2017-11-23 DIAGNOSIS — H01022 Squamous blepharitis right lower eyelid: Secondary | ICD-10-CM | POA: Diagnosis not present

## 2017-11-23 DIAGNOSIS — H40013 Open angle with borderline findings, low risk, bilateral: Secondary | ICD-10-CM | POA: Diagnosis not present

## 2017-11-23 DIAGNOSIS — H01021 Squamous blepharitis right upper eyelid: Secondary | ICD-10-CM | POA: Diagnosis not present

## 2017-11-23 DIAGNOSIS — H01025 Squamous blepharitis left lower eyelid: Secondary | ICD-10-CM | POA: Diagnosis not present

## 2017-11-25 DIAGNOSIS — F54 Psychological and behavioral factors associated with disorders or diseases classified elsewhere: Secondary | ICD-10-CM | POA: Diagnosis not present

## 2017-11-25 DIAGNOSIS — I10 Essential (primary) hypertension: Secondary | ICD-10-CM | POA: Diagnosis not present

## 2017-11-25 DIAGNOSIS — R569 Unspecified convulsions: Secondary | ICD-10-CM | POA: Diagnosis not present

## 2017-11-25 DIAGNOSIS — L8990 Pressure ulcer of unspecified site, unspecified stage: Secondary | ICD-10-CM | POA: Diagnosis not present

## 2017-12-06 DIAGNOSIS — R569 Unspecified convulsions: Secondary | ICD-10-CM | POA: Diagnosis not present

## 2017-12-06 DIAGNOSIS — I10 Essential (primary) hypertension: Secondary | ICD-10-CM | POA: Diagnosis not present

## 2017-12-06 DIAGNOSIS — I6789 Other cerebrovascular disease: Secondary | ICD-10-CM | POA: Diagnosis not present

## 2017-12-23 DIAGNOSIS — L8990 Pressure ulcer of unspecified site, unspecified stage: Secondary | ICD-10-CM | POA: Diagnosis not present

## 2017-12-23 DIAGNOSIS — F54 Psychological and behavioral factors associated with disorders or diseases classified elsewhere: Secondary | ICD-10-CM | POA: Diagnosis not present

## 2017-12-23 DIAGNOSIS — I10 Essential (primary) hypertension: Secondary | ICD-10-CM | POA: Diagnosis not present

## 2018-01-03 DIAGNOSIS — I10 Essential (primary) hypertension: Secondary | ICD-10-CM | POA: Diagnosis not present

## 2018-01-03 DIAGNOSIS — R569 Unspecified convulsions: Secondary | ICD-10-CM | POA: Diagnosis not present

## 2018-01-04 DIAGNOSIS — E785 Hyperlipidemia, unspecified: Secondary | ICD-10-CM | POA: Diagnosis not present

## 2018-01-04 DIAGNOSIS — I10 Essential (primary) hypertension: Secondary | ICD-10-CM | POA: Diagnosis not present

## 2018-01-04 DIAGNOSIS — D649 Anemia, unspecified: Secondary | ICD-10-CM | POA: Diagnosis not present

## 2018-01-20 DIAGNOSIS — I10 Essential (primary) hypertension: Secondary | ICD-10-CM | POA: Diagnosis not present

## 2018-01-20 DIAGNOSIS — R569 Unspecified convulsions: Secondary | ICD-10-CM | POA: Diagnosis not present

## 2018-01-20 DIAGNOSIS — L8990 Pressure ulcer of unspecified site, unspecified stage: Secondary | ICD-10-CM | POA: Diagnosis not present

## 2018-02-04 DIAGNOSIS — R569 Unspecified convulsions: Secondary | ICD-10-CM | POA: Diagnosis not present

## 2018-02-04 DIAGNOSIS — F54 Psychological and behavioral factors associated with disorders or diseases classified elsewhere: Secondary | ICD-10-CM | POA: Diagnosis not present

## 2018-02-17 DIAGNOSIS — I6789 Other cerebrovascular disease: Secondary | ICD-10-CM | POA: Diagnosis not present

## 2018-02-17 DIAGNOSIS — F54 Psychological and behavioral factors associated with disorders or diseases classified elsewhere: Secondary | ICD-10-CM | POA: Diagnosis not present

## 2018-02-17 DIAGNOSIS — I10 Essential (primary) hypertension: Secondary | ICD-10-CM | POA: Diagnosis not present

## 2018-03-07 DIAGNOSIS — I10 Essential (primary) hypertension: Secondary | ICD-10-CM | POA: Diagnosis not present

## 2018-03-07 DIAGNOSIS — F54 Psychological and behavioral factors associated with disorders or diseases classified elsewhere: Secondary | ICD-10-CM | POA: Diagnosis not present

## 2018-03-07 DIAGNOSIS — R569 Unspecified convulsions: Secondary | ICD-10-CM | POA: Diagnosis not present

## 2018-03-17 DIAGNOSIS — I10 Essential (primary) hypertension: Secondary | ICD-10-CM | POA: Diagnosis not present

## 2018-03-17 DIAGNOSIS — L8990 Pressure ulcer of unspecified site, unspecified stage: Secondary | ICD-10-CM | POA: Diagnosis not present

## 2018-03-17 DIAGNOSIS — R569 Unspecified convulsions: Secondary | ICD-10-CM | POA: Diagnosis not present

## 2018-04-05 DIAGNOSIS — R569 Unspecified convulsions: Secondary | ICD-10-CM | POA: Diagnosis not present

## 2018-04-05 DIAGNOSIS — I10 Essential (primary) hypertension: Secondary | ICD-10-CM | POA: Diagnosis not present

## 2018-04-05 DIAGNOSIS — F54 Psychological and behavioral factors associated with disorders or diseases classified elsewhere: Secondary | ICD-10-CM | POA: Diagnosis not present

## 2018-04-06 DIAGNOSIS — D649 Anemia, unspecified: Secondary | ICD-10-CM | POA: Diagnosis not present

## 2018-04-06 DIAGNOSIS — I10 Essential (primary) hypertension: Secondary | ICD-10-CM | POA: Diagnosis not present

## 2018-04-06 DIAGNOSIS — E785 Hyperlipidemia, unspecified: Secondary | ICD-10-CM | POA: Diagnosis not present

## 2018-04-06 DIAGNOSIS — N400 Enlarged prostate without lower urinary tract symptoms: Secondary | ICD-10-CM | POA: Diagnosis not present

## 2018-04-07 DIAGNOSIS — H01022 Squamous blepharitis right lower eyelid: Secondary | ICD-10-CM | POA: Diagnosis not present

## 2018-04-07 DIAGNOSIS — H25813 Combined forms of age-related cataract, bilateral: Secondary | ICD-10-CM | POA: Diagnosis not present

## 2018-04-07 DIAGNOSIS — H01024 Squamous blepharitis left upper eyelid: Secondary | ICD-10-CM | POA: Diagnosis not present

## 2018-04-07 DIAGNOSIS — H01025 Squamous blepharitis left lower eyelid: Secondary | ICD-10-CM | POA: Diagnosis not present

## 2018-04-07 DIAGNOSIS — H01021 Squamous blepharitis right upper eyelid: Secondary | ICD-10-CM | POA: Diagnosis not present

## 2018-04-07 DIAGNOSIS — H04123 Dry eye syndrome of bilateral lacrimal glands: Secondary | ICD-10-CM | POA: Diagnosis not present

## 2018-04-07 DIAGNOSIS — H40013 Open angle with borderline findings, low risk, bilateral: Secondary | ICD-10-CM | POA: Diagnosis not present

## 2018-04-28 DIAGNOSIS — I10 Essential (primary) hypertension: Secondary | ICD-10-CM | POA: Diagnosis not present

## 2018-04-28 DIAGNOSIS — F54 Psychological and behavioral factors associated with disorders or diseases classified elsewhere: Secondary | ICD-10-CM | POA: Diagnosis not present

## 2018-04-28 DIAGNOSIS — D291 Benign neoplasm of prostate: Secondary | ICD-10-CM | POA: Diagnosis not present

## 2018-05-04 DIAGNOSIS — I10 Essential (primary) hypertension: Secondary | ICD-10-CM | POA: Diagnosis not present

## 2018-05-04 DIAGNOSIS — D291 Benign neoplasm of prostate: Secondary | ICD-10-CM | POA: Diagnosis not present

## 2018-05-04 DIAGNOSIS — R569 Unspecified convulsions: Secondary | ICD-10-CM | POA: Diagnosis not present

## 2018-05-04 DIAGNOSIS — J189 Pneumonia, unspecified organism: Secondary | ICD-10-CM | POA: Diagnosis not present

## 2018-05-04 DIAGNOSIS — R293 Abnormal posture: Secondary | ICD-10-CM | POA: Diagnosis not present

## 2018-05-04 DIAGNOSIS — R279 Unspecified lack of coordination: Secondary | ICD-10-CM | POA: Diagnosis not present

## 2018-05-04 DIAGNOSIS — R4182 Altered mental status, unspecified: Secondary | ICD-10-CM | POA: Diagnosis not present

## 2018-05-05 DIAGNOSIS — R279 Unspecified lack of coordination: Secondary | ICD-10-CM | POA: Diagnosis not present

## 2018-05-05 DIAGNOSIS — R293 Abnormal posture: Secondary | ICD-10-CM | POA: Diagnosis not present

## 2018-05-05 DIAGNOSIS — J189 Pneumonia, unspecified organism: Secondary | ICD-10-CM | POA: Diagnosis not present

## 2018-05-05 DIAGNOSIS — R4182 Altered mental status, unspecified: Secondary | ICD-10-CM | POA: Diagnosis not present

## 2018-05-06 DIAGNOSIS — R4182 Altered mental status, unspecified: Secondary | ICD-10-CM | POA: Diagnosis not present

## 2018-05-06 DIAGNOSIS — R279 Unspecified lack of coordination: Secondary | ICD-10-CM | POA: Diagnosis not present

## 2018-05-06 DIAGNOSIS — J189 Pneumonia, unspecified organism: Secondary | ICD-10-CM | POA: Diagnosis not present

## 2018-05-06 DIAGNOSIS — R293 Abnormal posture: Secondary | ICD-10-CM | POA: Diagnosis not present

## 2018-05-07 DIAGNOSIS — R279 Unspecified lack of coordination: Secondary | ICD-10-CM | POA: Diagnosis not present

## 2018-05-07 DIAGNOSIS — J189 Pneumonia, unspecified organism: Secondary | ICD-10-CM | POA: Diagnosis not present

## 2018-05-07 DIAGNOSIS — R4182 Altered mental status, unspecified: Secondary | ICD-10-CM | POA: Diagnosis not present

## 2018-05-07 DIAGNOSIS — R293 Abnormal posture: Secondary | ICD-10-CM | POA: Diagnosis not present

## 2018-05-08 DIAGNOSIS — R279 Unspecified lack of coordination: Secondary | ICD-10-CM | POA: Diagnosis not present

## 2018-05-08 DIAGNOSIS — J189 Pneumonia, unspecified organism: Secondary | ICD-10-CM | POA: Diagnosis not present

## 2018-05-08 DIAGNOSIS — R293 Abnormal posture: Secondary | ICD-10-CM | POA: Diagnosis not present

## 2018-05-08 DIAGNOSIS — R4182 Altered mental status, unspecified: Secondary | ICD-10-CM | POA: Diagnosis not present

## 2018-05-09 DIAGNOSIS — M2010 Hallux valgus (acquired), unspecified foot: Secondary | ICD-10-CM | POA: Diagnosis not present

## 2018-05-09 DIAGNOSIS — Q845 Enlarged and hypertrophic nails: Secondary | ICD-10-CM | POA: Diagnosis not present

## 2018-05-09 DIAGNOSIS — L603 Nail dystrophy: Secondary | ICD-10-CM | POA: Diagnosis not present

## 2018-05-09 DIAGNOSIS — B351 Tinea unguium: Secondary | ICD-10-CM | POA: Diagnosis not present

## 2018-05-09 DIAGNOSIS — R600 Localized edema: Secondary | ICD-10-CM | POA: Diagnosis not present

## 2018-05-09 DIAGNOSIS — L853 Xerosis cutis: Secondary | ICD-10-CM | POA: Diagnosis not present

## 2018-05-09 DIAGNOSIS — I739 Peripheral vascular disease, unspecified: Secondary | ICD-10-CM | POA: Diagnosis not present

## 2018-05-11 DIAGNOSIS — M6281 Muscle weakness (generalized): Secondary | ICD-10-CM | POA: Diagnosis not present

## 2018-05-11 DIAGNOSIS — R293 Abnormal posture: Secondary | ICD-10-CM | POA: Diagnosis not present

## 2018-05-11 DIAGNOSIS — R4182 Altered mental status, unspecified: Secondary | ICD-10-CM | POA: Diagnosis not present

## 2018-05-11 DIAGNOSIS — J189 Pneumonia, unspecified organism: Secondary | ICD-10-CM | POA: Diagnosis not present

## 2018-05-11 DIAGNOSIS — R279 Unspecified lack of coordination: Secondary | ICD-10-CM | POA: Diagnosis not present

## 2018-05-12 DIAGNOSIS — M6281 Muscle weakness (generalized): Secondary | ICD-10-CM | POA: Diagnosis not present

## 2018-05-12 DIAGNOSIS — J189 Pneumonia, unspecified organism: Secondary | ICD-10-CM | POA: Diagnosis not present

## 2018-05-12 DIAGNOSIS — R279 Unspecified lack of coordination: Secondary | ICD-10-CM | POA: Diagnosis not present

## 2018-05-12 DIAGNOSIS — R293 Abnormal posture: Secondary | ICD-10-CM | POA: Diagnosis not present

## 2018-05-12 DIAGNOSIS — R4182 Altered mental status, unspecified: Secondary | ICD-10-CM | POA: Diagnosis not present

## 2018-05-13 DIAGNOSIS — M6281 Muscle weakness (generalized): Secondary | ICD-10-CM | POA: Diagnosis not present

## 2018-05-13 DIAGNOSIS — R279 Unspecified lack of coordination: Secondary | ICD-10-CM | POA: Diagnosis not present

## 2018-05-13 DIAGNOSIS — R4182 Altered mental status, unspecified: Secondary | ICD-10-CM | POA: Diagnosis not present

## 2018-05-13 DIAGNOSIS — R293 Abnormal posture: Secondary | ICD-10-CM | POA: Diagnosis not present

## 2018-05-13 DIAGNOSIS — J189 Pneumonia, unspecified organism: Secondary | ICD-10-CM | POA: Diagnosis not present

## 2018-05-16 DIAGNOSIS — J189 Pneumonia, unspecified organism: Secondary | ICD-10-CM | POA: Diagnosis not present

## 2018-05-16 DIAGNOSIS — R279 Unspecified lack of coordination: Secondary | ICD-10-CM | POA: Diagnosis not present

## 2018-05-16 DIAGNOSIS — M6281 Muscle weakness (generalized): Secondary | ICD-10-CM | POA: Diagnosis not present

## 2018-05-16 DIAGNOSIS — R4182 Altered mental status, unspecified: Secondary | ICD-10-CM | POA: Diagnosis not present

## 2018-05-16 DIAGNOSIS — R293 Abnormal posture: Secondary | ICD-10-CM | POA: Diagnosis not present

## 2018-05-17 DIAGNOSIS — R293 Abnormal posture: Secondary | ICD-10-CM | POA: Diagnosis not present

## 2018-05-17 DIAGNOSIS — R279 Unspecified lack of coordination: Secondary | ICD-10-CM | POA: Diagnosis not present

## 2018-05-17 DIAGNOSIS — M6281 Muscle weakness (generalized): Secondary | ICD-10-CM | POA: Diagnosis not present

## 2018-05-17 DIAGNOSIS — R4182 Altered mental status, unspecified: Secondary | ICD-10-CM | POA: Diagnosis not present

## 2018-05-17 DIAGNOSIS — J189 Pneumonia, unspecified organism: Secondary | ICD-10-CM | POA: Diagnosis not present

## 2018-05-19 DIAGNOSIS — R4182 Altered mental status, unspecified: Secondary | ICD-10-CM | POA: Diagnosis not present

## 2018-05-19 DIAGNOSIS — R293 Abnormal posture: Secondary | ICD-10-CM | POA: Diagnosis not present

## 2018-05-19 DIAGNOSIS — M6281 Muscle weakness (generalized): Secondary | ICD-10-CM | POA: Diagnosis not present

## 2018-05-19 DIAGNOSIS — R279 Unspecified lack of coordination: Secondary | ICD-10-CM | POA: Diagnosis not present

## 2018-05-19 DIAGNOSIS — J189 Pneumonia, unspecified organism: Secondary | ICD-10-CM | POA: Diagnosis not present

## 2018-05-26 DIAGNOSIS — I10 Essential (primary) hypertension: Secondary | ICD-10-CM | POA: Diagnosis not present

## 2018-05-26 DIAGNOSIS — N400 Enlarged prostate without lower urinary tract symptoms: Secondary | ICD-10-CM | POA: Diagnosis not present

## 2018-06-01 DIAGNOSIS — L03119 Cellulitis of unspecified part of limb: Secondary | ICD-10-CM | POA: Diagnosis not present

## 2018-06-01 DIAGNOSIS — R569 Unspecified convulsions: Secondary | ICD-10-CM | POA: Diagnosis not present

## 2018-06-01 DIAGNOSIS — I10 Essential (primary) hypertension: Secondary | ICD-10-CM | POA: Diagnosis not present

## 2018-06-01 DIAGNOSIS — N400 Enlarged prostate without lower urinary tract symptoms: Secondary | ICD-10-CM | POA: Diagnosis not present

## 2018-06-16 DIAGNOSIS — D649 Anemia, unspecified: Secondary | ICD-10-CM | POA: Diagnosis not present

## 2018-06-16 DIAGNOSIS — I10 Essential (primary) hypertension: Secondary | ICD-10-CM | POA: Diagnosis not present

## 2018-06-23 DIAGNOSIS — R569 Unspecified convulsions: Secondary | ICD-10-CM | POA: Diagnosis not present

## 2018-06-23 DIAGNOSIS — L03119 Cellulitis of unspecified part of limb: Secondary | ICD-10-CM | POA: Diagnosis not present

## 2018-06-23 DIAGNOSIS — I10 Essential (primary) hypertension: Secondary | ICD-10-CM | POA: Diagnosis not present

## 2018-07-04 DIAGNOSIS — I10 Essential (primary) hypertension: Secondary | ICD-10-CM | POA: Diagnosis not present

## 2018-07-04 DIAGNOSIS — F0390 Unspecified dementia without behavioral disturbance: Secondary | ICD-10-CM | POA: Diagnosis not present

## 2018-07-04 DIAGNOSIS — R569 Unspecified convulsions: Secondary | ICD-10-CM | POA: Diagnosis not present

## 2018-07-04 DIAGNOSIS — I872 Venous insufficiency (chronic) (peripheral): Secondary | ICD-10-CM | POA: Diagnosis not present

## 2018-07-05 DIAGNOSIS — E119 Type 2 diabetes mellitus without complications: Secondary | ICD-10-CM | POA: Diagnosis not present

## 2018-07-05 DIAGNOSIS — E785 Hyperlipidemia, unspecified: Secondary | ICD-10-CM | POA: Diagnosis not present

## 2018-07-05 DIAGNOSIS — E039 Hypothyroidism, unspecified: Secondary | ICD-10-CM | POA: Diagnosis not present

## 2018-07-05 DIAGNOSIS — Z79899 Other long term (current) drug therapy: Secondary | ICD-10-CM | POA: Diagnosis not present

## 2018-07-05 DIAGNOSIS — D649 Anemia, unspecified: Secondary | ICD-10-CM | POA: Diagnosis not present

## 2018-07-06 DIAGNOSIS — J9811 Atelectasis: Secondary | ICD-10-CM | POA: Diagnosis not present

## 2018-07-10 ENCOUNTER — Other Ambulatory Visit: Payer: Self-pay

## 2018-07-10 ENCOUNTER — Inpatient Hospital Stay (HOSPITAL_COMMUNITY)
Admission: EM | Admit: 2018-07-10 | Discharge: 2018-07-15 | DRG: 871 | Disposition: A | Discharge status: Skilled Nursing Facility | Payer: Medicare Other | Attending: Internal Medicine | Admitting: Internal Medicine

## 2018-07-10 ENCOUNTER — Emergency Department (HOSPITAL_COMMUNITY): Payer: Medicare Other

## 2018-07-10 ENCOUNTER — Encounter (HOSPITAL_COMMUNITY): Payer: Self-pay | Admitting: Emergency Medicine

## 2018-07-10 ENCOUNTER — Inpatient Hospital Stay (HOSPITAL_COMMUNITY): Payer: Medicare Other

## 2018-07-10 DIAGNOSIS — R64 Cachexia: Secondary | ICD-10-CM | POA: Diagnosis present

## 2018-07-10 DIAGNOSIS — R001 Bradycardia, unspecified: Secondary | ICD-10-CM | POA: Diagnosis present

## 2018-07-10 DIAGNOSIS — L80 Vitiligo: Secondary | ICD-10-CM

## 2018-07-10 DIAGNOSIS — R402242 Coma scale, best verbal response, confused conversation, at arrival to emergency department: Secondary | ICD-10-CM | POA: Diagnosis present

## 2018-07-10 DIAGNOSIS — R4182 Altered mental status, unspecified: Secondary | ICD-10-CM | POA: Diagnosis present

## 2018-07-10 DIAGNOSIS — G9341 Metabolic encephalopathy: Secondary | ICD-10-CM | POA: Diagnosis present

## 2018-07-10 DIAGNOSIS — M7122 Synovial cyst of popliteal space [Baker], left knee: Secondary | ICD-10-CM | POA: Diagnosis not present

## 2018-07-10 DIAGNOSIS — Z7189 Other specified counseling: Secondary | ICD-10-CM

## 2018-07-10 DIAGNOSIS — E161 Other hypoglycemia: Secondary | ICD-10-CM | POA: Diagnosis not present

## 2018-07-10 DIAGNOSIS — R471 Dysarthria and anarthria: Secondary | ICD-10-CM | POA: Diagnosis not present

## 2018-07-10 DIAGNOSIS — E162 Hypoglycemia, unspecified: Secondary | ICD-10-CM | POA: Diagnosis present

## 2018-07-10 DIAGNOSIS — I10 Essential (primary) hypertension: Secondary | ICD-10-CM

## 2018-07-10 DIAGNOSIS — M7989 Other specified soft tissue disorders: Secondary | ICD-10-CM

## 2018-07-10 DIAGNOSIS — N3000 Acute cystitis without hematuria: Secondary | ICD-10-CM

## 2018-07-10 DIAGNOSIS — Z515 Encounter for palliative care: Secondary | ICD-10-CM | POA: Diagnosis not present

## 2018-07-10 DIAGNOSIS — E039 Hypothyroidism, unspecified: Secondary | ICD-10-CM | POA: Diagnosis present

## 2018-07-10 DIAGNOSIS — E876 Hypokalemia: Secondary | ICD-10-CM | POA: Diagnosis present

## 2018-07-10 DIAGNOSIS — R918 Other nonspecific abnormal finding of lung field: Secondary | ICD-10-CM | POA: Diagnosis not present

## 2018-07-10 DIAGNOSIS — Z960 Presence of urogenital implants: Secondary | ICD-10-CM | POA: Diagnosis not present

## 2018-07-10 DIAGNOSIS — M2550 Pain in unspecified joint: Secondary | ICD-10-CM | POA: Diagnosis not present

## 2018-07-10 DIAGNOSIS — A419 Sepsis, unspecified organism: Secondary | ICD-10-CM | POA: Diagnosis not present

## 2018-07-10 DIAGNOSIS — R6520 Severe sepsis without septic shock: Secondary | ICD-10-CM

## 2018-07-10 DIAGNOSIS — R279 Unspecified lack of coordination: Secondary | ICD-10-CM | POA: Diagnosis present

## 2018-07-10 DIAGNOSIS — R609 Edema, unspecified: Secondary | ICD-10-CM | POA: Diagnosis not present

## 2018-07-10 DIAGNOSIS — Z79899 Other long term (current) drug therapy: Secondary | ICD-10-CM | POA: Diagnosis not present

## 2018-07-10 DIAGNOSIS — R1310 Dysphagia, unspecified: Secondary | ICD-10-CM | POA: Diagnosis present

## 2018-07-10 DIAGNOSIS — I878 Other specified disorders of veins: Secondary | ICD-10-CM | POA: Diagnosis not present

## 2018-07-10 DIAGNOSIS — R4702 Dysphasia: Secondary | ICD-10-CM | POA: Diagnosis present

## 2018-07-10 DIAGNOSIS — M7121 Synovial cyst of popliteal space [Baker], right knee: Secondary | ICD-10-CM | POA: Diagnosis present

## 2018-07-10 DIAGNOSIS — R419 Unspecified symptoms and signs involving cognitive functions and awareness: Secondary | ICD-10-CM | POA: Diagnosis not present

## 2018-07-10 DIAGNOSIS — R4189 Other symptoms and signs involving cognitive functions and awareness: Secondary | ICD-10-CM

## 2018-07-10 DIAGNOSIS — Z6820 Body mass index (BMI) 20.0-20.9, adult: Secondary | ICD-10-CM | POA: Diagnosis not present

## 2018-07-10 DIAGNOSIS — K7200 Acute and subacute hepatic failure without coma: Secondary | ICD-10-CM | POA: Diagnosis present

## 2018-07-10 DIAGNOSIS — T68XXXS Hypothermia, sequela: Secondary | ICD-10-CM | POA: Diagnosis not present

## 2018-07-10 DIAGNOSIS — J9811 Atelectasis: Secondary | ICD-10-CM | POA: Diagnosis present

## 2018-07-10 DIAGNOSIS — E871 Hypo-osmolality and hyponatremia: Secondary | ICD-10-CM | POA: Diagnosis present

## 2018-07-10 DIAGNOSIS — Z79890 Hormone replacement therapy (postmenopausal): Secondary | ICD-10-CM | POA: Diagnosis not present

## 2018-07-10 DIAGNOSIS — M79609 Pain in unspecified limb: Secondary | ICD-10-CM | POA: Diagnosis not present

## 2018-07-10 DIAGNOSIS — R29810 Facial weakness: Secondary | ICD-10-CM | POA: Diagnosis not present

## 2018-07-10 DIAGNOSIS — I9589 Other hypotension: Secondary | ICD-10-CM | POA: Diagnosis present

## 2018-07-10 DIAGNOSIS — Z781 Physical restraint status: Secondary | ICD-10-CM

## 2018-07-10 DIAGNOSIS — R627 Adult failure to thrive: Secondary | ICD-10-CM | POA: Diagnosis not present

## 2018-07-10 DIAGNOSIS — Z452 Encounter for adjustment and management of vascular access device: Secondary | ICD-10-CM | POA: Diagnosis not present

## 2018-07-10 DIAGNOSIS — J189 Pneumonia, unspecified organism: Secondary | ICD-10-CM

## 2018-07-10 DIAGNOSIS — D638 Anemia in other chronic diseases classified elsewhere: Secondary | ICD-10-CM | POA: Diagnosis present

## 2018-07-10 DIAGNOSIS — Z7401 Bed confinement status: Secondary | ICD-10-CM | POA: Diagnosis not present

## 2018-07-10 DIAGNOSIS — Z6822 Body mass index (BMI) 22.0-22.9, adult: Secondary | ICD-10-CM

## 2018-07-10 DIAGNOSIS — Z8701 Personal history of pneumonia (recurrent): Secondary | ICD-10-CM | POA: Diagnosis not present

## 2018-07-10 DIAGNOSIS — R109 Unspecified abdominal pain: Secondary | ICD-10-CM | POA: Diagnosis not present

## 2018-07-10 DIAGNOSIS — Z993 Dependence on wheelchair: Secondary | ICD-10-CM | POA: Diagnosis not present

## 2018-07-10 DIAGNOSIS — T68XXXA Hypothermia, initial encounter: Secondary | ICD-10-CM | POA: Diagnosis present

## 2018-07-10 DIAGNOSIS — M6281 Muscle weakness (generalized): Secondary | ICD-10-CM | POA: Diagnosis present

## 2018-07-10 DIAGNOSIS — R740 Nonspecific elevation of levels of transaminase and lactic acid dehydrogenase [LDH]: Secondary | ICD-10-CM | POA: Diagnosis not present

## 2018-07-10 DIAGNOSIS — R933 Abnormal findings on diagnostic imaging of other parts of digestive tract: Secondary | ICD-10-CM | POA: Diagnosis not present

## 2018-07-10 DIAGNOSIS — R401 Stupor: Secondary | ICD-10-CM | POA: Diagnosis not present

## 2018-07-10 DIAGNOSIS — R571 Hypovolemic shock: Secondary | ICD-10-CM | POA: Diagnosis present

## 2018-07-10 DIAGNOSIS — R6521 Severe sepsis with septic shock: Secondary | ICD-10-CM | POA: Diagnosis present

## 2018-07-10 DIAGNOSIS — J690 Pneumonitis due to inhalation of food and vomit: Secondary | ICD-10-CM | POA: Diagnosis present

## 2018-07-10 DIAGNOSIS — Z4682 Encounter for fitting and adjustment of non-vascular catheter: Secondary | ICD-10-CM | POA: Diagnosis not present

## 2018-07-10 DIAGNOSIS — R402132 Coma scale, eyes open, to sound, at arrival to emergency department: Secondary | ICD-10-CM | POA: Diagnosis present

## 2018-07-10 DIAGNOSIS — R41841 Cognitive communication deficit: Secondary | ICD-10-CM | POA: Diagnosis present

## 2018-07-10 DIAGNOSIS — E43 Unspecified severe protein-calorie malnutrition: Secondary | ICD-10-CM | POA: Diagnosis present

## 2018-07-10 DIAGNOSIS — R2681 Unsteadiness on feet: Secondary | ICD-10-CM | POA: Diagnosis present

## 2018-07-10 DIAGNOSIS — R680 Hypothermia, not associated with low environmental temperature: Secondary | ICD-10-CM | POA: Diagnosis not present

## 2018-07-10 DIAGNOSIS — R402362 Coma scale, best motor response, obeys commands, at arrival to emergency department: Secondary | ICD-10-CM | POA: Diagnosis present

## 2018-07-10 DIAGNOSIS — F79 Unspecified intellectual disabilities: Secondary | ICD-10-CM | POA: Diagnosis present

## 2018-07-10 DIAGNOSIS — R29818 Other symptoms and signs involving the nervous system: Secondary | ICD-10-CM | POA: Diagnosis not present

## 2018-07-10 DIAGNOSIS — R1312 Dysphagia, oropharyngeal phase: Secondary | ICD-10-CM | POA: Diagnosis present

## 2018-07-10 DIAGNOSIS — N400 Enlarged prostate without lower urinary tract symptoms: Secondary | ICD-10-CM

## 2018-07-10 DIAGNOSIS — I959 Hypotension, unspecified: Secondary | ICD-10-CM | POA: Diagnosis not present

## 2018-07-10 DIAGNOSIS — J181 Lobar pneumonia, unspecified organism: Secondary | ICD-10-CM

## 2018-07-10 DIAGNOSIS — I6789 Other cerebrovascular disease: Secondary | ICD-10-CM | POA: Diagnosis not present

## 2018-07-10 LAB — CBC
HEMATOCRIT: 30.5 % — AB (ref 39.0–52.0)
Hemoglobin: 9.6 g/dL — ABNORMAL LOW (ref 13.0–17.0)
MCH: 28.9 pg (ref 26.0–34.0)
MCHC: 31.5 g/dL (ref 30.0–36.0)
MCV: 91.9 fL (ref 78.0–100.0)
Platelets: 121 10*3/uL — ABNORMAL LOW (ref 150–400)
RBC: 3.32 MIL/uL — ABNORMAL LOW (ref 4.22–5.81)
RDW: 14 % (ref 11.5–15.5)
WBC: 3.7 10*3/uL — AB (ref 4.0–10.5)

## 2018-07-10 LAB — CBC WITH DIFFERENTIAL/PLATELET
Abs Immature Granulocytes: 0 10*3/uL (ref 0.0–0.1)
BASOS PCT: 0 %
Basophils Absolute: 0 10*3/uL (ref 0.0–0.1)
EOS ABS: 0.5 10*3/uL (ref 0.0–0.7)
Eosinophils Relative: 9 %
HCT: 42 % (ref 39.0–52.0)
Hemoglobin: 13.2 g/dL (ref 13.0–17.0)
Immature Granulocytes: 0 %
Lymphocytes Relative: 27 %
Lymphs Abs: 1.4 10*3/uL (ref 0.7–4.0)
MCH: 29.3 pg (ref 26.0–34.0)
MCHC: 31.4 g/dL (ref 30.0–36.0)
MCV: 93.1 fL (ref 78.0–100.0)
Monocytes Absolute: 0.3 10*3/uL (ref 0.1–1.0)
Monocytes Relative: 5 %
NEUTROS PCT: 59 %
Neutro Abs: 3.2 10*3/uL (ref 1.7–7.7)
PLATELETS: 143 10*3/uL — AB (ref 150–400)
RBC: 4.51 MIL/uL (ref 4.22–5.81)
RDW: 14 % (ref 11.5–15.5)
WBC: 5.4 10*3/uL (ref 4.0–10.5)

## 2018-07-10 LAB — COMPREHENSIVE METABOLIC PANEL
ALBUMIN: 2.1 g/dL — AB (ref 3.5–5.0)
ALT: 57 U/L — AB (ref 0–44)
ALT: 144 U/L — ABNORMAL HIGH (ref 0–44)
AST: 87 U/L — ABNORMAL HIGH (ref 15–41)
AST: 220 U/L — AB (ref 15–41)
Albumin: 3 g/dL — ABNORMAL LOW (ref 3.5–5.0)
Alkaline Phosphatase: 137 U/L — ABNORMAL HIGH (ref 38–126)
Alkaline Phosphatase: 337 U/L — ABNORMAL HIGH (ref 38–126)
Anion gap: 8 (ref 5–15)
Anion gap: 7 (ref 5–15)
BUN: 20 mg/dL (ref 8–23)
BUN: 20 mg/dL (ref 8–23)
CHLORIDE: 106 mmol/L (ref 98–111)
CO2: 31 mmol/L (ref 22–32)
CO2: 24 mmol/L (ref 22–32)
CREATININE: 0.84 mg/dL (ref 0.61–1.24)
Calcium: 9.6 mg/dL (ref 8.9–10.3)
Calcium: 7.7 mg/dL — ABNORMAL LOW (ref 8.9–10.3)
Chloride: 101 mmol/L (ref 98–111)
Creatinine, Ser: 0.84 mg/dL (ref 0.61–1.24)
GFR calc Af Amer: >60 mL/min (ref >60)
GFR calc Af Amer: >60 mL/min (ref >60)
GFR calc non Af Amer: >60 mL/min (ref >60)
GFR calc non Af Amer: >60 mL/min (ref >60)
GLUCOSE: 186 mg/dL — AB (ref 70–99)
Glucose, Bld: 71 mg/dL (ref 70–99)
POTASSIUM: 3.7 mmol/L (ref 3.5–5.1)
Potassium: 4.1 mmol/L (ref 3.5–5.1)
SODIUM: 137 mmol/L (ref 135–145)
Sodium: 140 mmol/L (ref 135–145)
Total Bilirubin: 0.7 mg/dL (ref 0.3–1.2)
Total Bilirubin: 0.6 mg/dL (ref 0.3–1.2)
Total Protein: 7.7 g/dL (ref 6.5–8.1)
Total Protein: 5.5 g/dL — ABNORMAL LOW (ref 6.5–8.1)

## 2018-07-10 LAB — I-STAT CG4 LACTIC ACID, ED: LACTIC ACID, VENOUS: 1.06 mmol/L (ref 0.5–1.9)

## 2018-07-10 LAB — URINALYSIS, ROUTINE W REFLEX MICROSCOPIC
BILIRUBIN URINE: NEGATIVE
Bacteria, UA: NONE SEEN
Glucose, UA: NEGATIVE mg/dL
Ketones, ur: NEGATIVE mg/dL
NITRITE: POSITIVE — AB
Protein, ur: NEGATIVE mg/dL
SPECIFIC GRAVITY, URINE: 1.016 (ref 1.005–1.030)
pH: 6 (ref 5.0–8.0)

## 2018-07-10 LAB — I-STAT CHEM 8, ED
BUN: 24 mg/dL — AB (ref 8–23)
CREATININE: 0.9 mg/dL (ref 0.61–1.24)
Calcium, Ion: 1.23 mmol/L (ref 1.15–1.40)
Chloride: 100 mmol/L (ref 98–111)
Glucose, Bld: 67 mg/dL — ABNORMAL LOW (ref 70–99)
HEMATOCRIT: 41 % (ref 39.0–52.0)
Hemoglobin: 13.9 g/dL (ref 13.0–17.0)
POTASSIUM: 4 mmol/L (ref 3.5–5.1)
Sodium: 140 mmol/L (ref 135–145)
TCO2: 30 mmol/L (ref 22–32)

## 2018-07-10 LAB — PROTIME-INR
INR: 1.2
Prothrombin Time: 15.1 seconds (ref 11.4–15.2)

## 2018-07-10 LAB — BASIC METABOLIC PANEL
ANION GAP: 7 (ref 5–15)
BUN: 20 mg/dL (ref 8–23)
CALCIUM: 8.5 mg/dL — AB (ref 8.9–10.3)
CO2: 28 mmol/L (ref 22–32)
Chloride: 104 mmol/L (ref 98–111)
Creatinine, Ser: 0.83 mg/dL (ref 0.61–1.24)
GFR calc Af Amer: >60 mL/min (ref >60)
GLUCOSE: 77 mg/dL (ref 70–99)
Potassium: 4 mmol/L (ref 3.5–5.1)
SODIUM: 139 mmol/L (ref 135–145)

## 2018-07-10 LAB — POCT I-STAT 3, ART BLOOD GAS (G3+)
BICARBONATE: 25.7 mmol/L (ref 20.0–28.0)
O2 Saturation: 98 %
PH ART: 7.401 (ref 7.350–7.450)
Patient temperature: 93.1
TCO2: 27 mmol/L (ref 22–32)
pCO2 arterial: 40.1 mmHg (ref 32.0–48.0)
pO2, Arterial: 87 mmHg (ref 83.0–108.0)

## 2018-07-10 LAB — I-STAT TROPONIN, ED: TROPONIN I, POC: 0.01 ng/mL (ref 0.00–0.08)

## 2018-07-10 LAB — GLUCOSE, CAPILLARY
GLUCOSE-CAPILLARY: 111 mg/dL — AB (ref 70–99)
Glucose-Capillary: 52 mg/dL — ABNORMAL LOW (ref 70–99)

## 2018-07-10 LAB — LACTIC ACID, PLASMA
LACTIC ACID, VENOUS: 0.8 mmol/L (ref 0.5–1.9)
Lactic Acid, Venous: 0.8 mmol/L (ref 0.5–1.9)

## 2018-07-10 LAB — MRSA PCR SCREENING: MRSA BY PCR: POSITIVE — AB

## 2018-07-10 LAB — TSH: TSH: 5.121 u[IU]/mL — ABNORMAL HIGH (ref 0.350–4.500)

## 2018-07-10 LAB — PROCALCITONIN

## 2018-07-10 MED ORDER — LATANOPROST 0.005 % OP SOLN
1.0000 [drp] | Freq: Every day | OPHTHALMIC | Status: DC
  Administered 2018-07-10 – 2018-07-14 (×5): 1 [drp] via OPHTHALMIC
  Filled 2018-07-10 (×2): qty 2.5

## 2018-07-10 MED ORDER — ACETAMINOPHEN 650 MG RE SUPP: 650.0000 mg | Freq: Four times a day (QID) | RECTAL | Status: DC | PRN

## 2018-07-10 MED ORDER — SODIUM CHLORIDE 0.9 % IV BOLUS (SEPSIS)
1000.0000 mL | Freq: Once | INTRAVENOUS | Status: AC
  Administered 2018-07-10: 1000 mL via INTRAVENOUS

## 2018-07-10 MED ORDER — SODIUM CHLORIDE 0.9 % IV SOLN
1.0000 g | Freq: Three times a day (TID) | INTRAVENOUS | Status: DC
  Administered 2018-07-10 – 2018-07-13 (×8): 1 g via INTRAVENOUS
  Filled 2018-07-10 (×10): qty 1

## 2018-07-10 MED ORDER — MAGNESIUM HYDROXIDE 400 MG/5ML PO SUSP: 30.0000 mL | Freq: Every day | ORAL | Status: DC | PRN

## 2018-07-10 MED ORDER — SODIUM CHLORIDE 0.9 % IV BOLUS
1000.0000 mL | Freq: Once | INTRAVENOUS | Status: AC
  Administered 2018-07-10: 1000 mL via INTRAVENOUS

## 2018-07-10 MED ORDER — SENNOSIDES-DOCUSATE SODIUM 8.6-50 MG PO TABS: 1.0000 | ORAL_TABLET | Freq: Every evening | ORAL | Status: DC | PRN

## 2018-07-10 MED ORDER — DEXTROSE 50 % IV SOLN
INTRAVENOUS | Status: AC
  Administered 2018-07-10: 19:00:00
  Filled 2018-07-10: qty 50

## 2018-07-10 MED ORDER — SODIUM CHLORIDE 0.9 % IV SOLN
INTRAVENOUS | Status: DC
  Administered 2018-07-10: 15:00:00 via INTRAVENOUS

## 2018-07-10 MED ORDER — NOREPINEPHRINE 4 MG/250ML-% IV SOLN
0.0000 ug/min | INTRAVENOUS | Status: DC
  Filled 2018-07-10: qty 250

## 2018-07-10 MED ORDER — NOREPINEPHRINE 16 MG/250ML-% IV SOLN
0.0000 ug/min | INTRAVENOUS | Status: DC
  Administered 2018-07-10: 2 ug/min via INTRAVENOUS
  Filled 2018-07-10: qty 250

## 2018-07-10 MED ORDER — SODIUM CHLORIDE 0.9 % IV SOLN
2.0000 g | Freq: Once | INTRAVENOUS | Status: AC
  Administered 2018-07-10: 2 g via INTRAVENOUS
  Filled 2018-07-10: qty 2

## 2018-07-10 MED ORDER — ENOXAPARIN SODIUM 40 MG/0.4ML ~~LOC~~ SOLN
40.0000 mg | SUBCUTANEOUS | Status: DC
  Administered 2018-07-11 – 2018-07-15 (×5): 40 mg via SUBCUTANEOUS
  Filled 2018-07-10 (×5): qty 0.4

## 2018-07-10 MED ORDER — ACETAMINOPHEN 325 MG PO TABS: 650.0000 mg | ORAL_TABLET | Freq: Four times a day (QID) | ORAL | Status: DC | PRN

## 2018-07-10 MED ORDER — VANCOMYCIN HCL IN DEXTROSE 1-5 GM/200ML-% IV SOLN
1000.0000 mg | Freq: Once | INTRAVENOUS | Status: AC
  Administered 2018-07-10: 1000 mg via INTRAVENOUS
  Filled 2018-07-10: qty 200

## 2018-07-10 MED ORDER — SODIUM CHLORIDE 0.9 % IV BOLUS (SEPSIS)
250.0000 mL | Freq: Once | INTRAVENOUS | Status: AC
  Administered 2018-07-10: 250 mL via INTRAVENOUS

## 2018-07-10 MED ORDER — DEXTROSE-NACL 5-0.9 % IV SOLN: INTRAVENOUS | Status: DC

## 2018-07-10 MED ORDER — DEXTROSE IN LACTATED RINGERS 5 % IV SOLN
INTRAVENOUS | Status: DC
  Administered 2018-07-10 – 2018-07-11 (×3): via INTRAVENOUS

## 2018-07-10 MED ORDER — VANCOMYCIN HCL 500 MG IV SOLR
500.0000 mg | Freq: Two times a day (BID) | INTRAVENOUS | Status: DC
  Administered 2018-07-10 – 2018-07-13 (×5): 500 mg via INTRAVENOUS
  Filled 2018-07-10 (×6): qty 500

## 2018-07-10 MED ORDER — TAMSULOSIN HCL 0.4 MG PO CAPS
0.4000 mg | ORAL_CAPSULE | Freq: Every day | ORAL | Status: DC
  Administered 2018-07-13 – 2018-07-14 (×2): 0.4 mg via ORAL
  Filled 2018-07-10 (×3): qty 1

## 2018-07-10 MED ORDER — LEVOTHYROXINE SODIUM 100 MCG IV SOLR
12.5000 ug | Freq: Every day | INTRAVENOUS | Status: DC
  Administered 2018-07-11 – 2018-07-14 (×4): 12.5 ug via INTRAVENOUS
  Filled 2018-07-10 (×4): qty 5

## 2018-07-10 NOTE — ED Triage Notes (Signed)
Per [facility] pt coming from [place] due to declining x "a couple weeks." states pt is no longer speaking, eating or walking which is not normal. Unsure of how long. Pt is following commands. Pt cold to touch, rectal temp 89.2 behr hugger placed. CBG 81 300CC NS given en route.

## 2018-07-10 NOTE — Progress Notes (Signed)
Long discussion with the patient's power of attorney and niece [name].  We discussed current diagnoses, current challenges, current response to therapies.  She was particularly interested in discussing goals of care, particularly invasive therapies and life support.  I discussed with her our current treatment including central access and vasoactive drips.  I also shared with her my concern about crossing the threshold of life support, and the poor prognosis associated with survival after CPR.  We went over a Most form.  I have made some recommendations to her, further recommending that she go over this with her family.  Specifically a reasonable course of therapy for a man with his comorbidities would include: Aggressive medical therapy, this would include IV therapy, IV antibiotics, central access, and short course of vasoactive drips.  I do believe that if his illness was bad enough to result in cardiac or respiratory arrest or should he develop respiratory failure bad enough to require mechanical ventilation I think even if he were to survive this it would potentially have significant impact on his quality of life thereafter.  His family seems quite concerned about that.  And because of this they seem open to DO NOT RESUSCITATE, however they do desire we provide aggressive medical therapy short of that.  I provided her with a copy of the most form which she will bring back to her family and discuss further.  She made several notes.  I have also discussed with her consulting the palliative care team which would be an excellent resource going forward.  The plan would be that she will discuss the most form further with her family, and the palliative care team could continue to explore advanced directives further.  At this point he remains a full resuscitation however I am certain that they would not want prolonged care.  She does want some more time to discuss the most form with her family before making any  definitive decisions  Jesus Ramsey ACNP-BC [place] Pager # [phone] OR # [phone] if no answer

## 2018-07-10 NOTE — H&P (Addendum)
   Date: 07/10/2018               Patient Name:  Jesus Ramsey MRN: [id number]  DOB: 07/09/1931 Age / Sex: 84 y.o., male   PCP: Tejan-Sie, S Ahmed, MD         Medical Service: Internal Medicine Teaching Service         Attending Physician: Dr. Klima, Lawrence, MD    First Contact: Dr. ,  Pager: [phone]  Second Contact: Dr. Melvin, Alec Pager: [phone]       After Hours (After 5p/  First Contact Pager: [phone]  weekends / holidays): Second Contact Pager: [phone]   Chief Complaint: Altered Mental Status  History of Present Illness:  [name] is a 84 yo M w/ PMH of BPH, Hypothyroidism, HTN presenting with 2 week hx of altered mental status.  [name] was examined at bedside in the ED with no family present. He continually states that he is hungry and would like to eat. He is alert and oriented x1 to place but not name or time. He is unable to recall any history on chart review he has a history of cognitive deficits.  He is unable to provide any significant history or states why he is at the hospital. He denies any pain fever chills nausea vomiting. Chart review shows 2 week hx of 'speech changes' and lowered level of activity per nursing home staff. Attempted to call family (niece [phone]) but did not pick up and left voicemail.  Attempted to call nursing facility ([facility] [phone]) but no one picked up, left voicemail.  He was brought by EMS from [facility] nursing home to ED. Found to be hypotensive, bradycardic and hypothermic. Put on bear huggers and given 1L NS bolus. Found to have left lower lobe opacity. Started on cefepime and vanc. CT head showed generalized atrophy but no acute abnormalities. Lactate negative, Troponin negative  Meds:  Acetaminophen 650 mg q6hr PRN ASA 81 mg daily Metoprolol Tartarate 75mg BID Tamsulosin 0.4mg daily Sodium Chloride 1g tab Milk of magnesia 400mg/5mg PRN  Allergies: Allergies as of 07/10/2018  .  (No Known Allergies)   Past Medical History:  Diagnosis Date  . Altered mental status   . B12 deficiency 01/14/2012  . B12 deficiency anemia   . BPH (benign prostatic hypertrophy)   . Cataract   . Chronic hyponatremia    /notes 01/13/2012  . Dysphagia   . Hypertension   . Mental retardation   . Pneumonia   . Psychogenic polydipsia   . Seizures (HCC)     Family History: Pt unable to provide  Social History: Pt unable to provide specifics. Currently living at [facility] nursing home  Review of Systems: A complete ROS was negative except as per HPI.  Physical Exam: Blood pressure 103/64, pulse (!) 55, temperature (!) 89.9 F (32.2 C), temperature source Rectal, resp. rate 19, SpO2 97 %.   Physical Exam  Constitutional: No distress.  Cachetic appearing male  HENT:  Head: Normocephalic and atraumatic.  Mouth/Throat: Oropharyngeal exudate (yellow-white sputum) present.  Temporal wasting  Neck: Normal range of motion. Neck supple. No JVD present.  Cardiovascular: Normal rate, regular rhythm, normal heart sounds and intact distal pulses.  Pulmonary/Chest: Effort normal and breath sounds normal. No respiratory distress. He has no wheezes. He has no rales.  Abdominal: Soft. Bowel sounds are normal. He exhibits no distension. There is no tenderness. There is no guarding.  Genitourinary:  Genitourinary Comments: Condom   cath on  Musculoskeletal: Normal range of motion. He exhibits edema (left lower extremity with 2+ pitting edema. right lower extremity with trace pitting edema). He exhibits no tenderness or deformity.  Lymphadenopathy:    He has no cervical adenopathy.  Neurological: He is alert.  Oriented x 1 for place. Unable to follow directions for neuro exam.  Skin: Skin is warm and dry. He is not diaphoretic.  Psychiatric: Affect normal.    EKG: personally reviewed my interpretation is Lot of background artifact. no ST elevation or depression. No obvious t-wave  abnormality. No axis deviation.  CXR: personally reviewed my interpretation is left lower lobe opacity. No pulmonary edema. No obvious cardiomegaly. Bone normal.  Assessment & Plan by Problem: Active Problems:   Altered mental status  [name] is a 84 yo M w/ PMH of BPH, Hypothyroidism, HTN presenting with 2 week hx of altered mental status. Difficult to assess as patient is unable to provide history and attempts to reach out to caretakers have not been successful. Unclear if this is his baseline mental status as he has long history of cognitive deficits on chart review. Concerning for sepsis with hypotension and hypothermia with possible pulmonary origin however he has no leukocytosis. Will treat empirically for HCAP while ruling out other sources of infection.  Altered mental status 2/2 hypothermia vs pneumonia vs failure to thrive vs UTI Presented with 'speech changes and lowered activity level.' Will keep reaching out to caretakers to establish baseline and compare to current status. qSOFA 2/3 - High Risk for Sepsis. Presented with 89.36F on rectal temp. Currently on bear hugger. Will continue with empiric treatment and work up. WBC (5.4) - F/u UA/Urine Culture/Blood culture - F/u Procalcitonin - Speech& Swallow eval for malnutrition 2/2 dysphagia - C/w cefepime 1g q8hr, Vanc per pharmacy  - Telemetry to assess arrhythmia in setting of hypothermia - C/w bair hugger  - C/w maintenance fluid: Warmed NS 75cc/hr - Hold home bp med: metoprolol 28m BID  Transaminitis  AST 87, ALT 56, Alk phos 137. No clear history of alcohol use.  - Trend CMP  Lower extremity swelling 2/2 DVT vs CHF vs Lymphedema Has 2+ pitting edema on left greater than right. Unclear of mobility status. 3+ on [place]. No history of heart failure. No recent Echo. - DVT ultrasound of left lower extremity  Dispo: Admit patient to Inpatient with expected length of stay greater than 2 midnights.  Diet: Npo til speech and  swallow DVT prophx: Lovenox Bowel: Milk of Magnesia, Senokot Code: Full  Signed: -Kristie Cowman PGY1 Pager: [phone]

## 2018-07-10 NOTE — Evaluation (Signed)
Clinical/Bedside Swallow Evaluation Patient Details  Name: Jesus Ramsey MRN: [id number] Date of Birth: [birth year]  Today's Date: 07/10/2018 Time: SLP Start Time (ACUTE ONLY): 0973 SLP Stop Time (ACUTE ONLY): 1650 SLP Time Calculation (min) (ACUTE ONLY): 17 min  Past Medical History:  Past Medical History:  Diagnosis Date  . Altered mental status   . B12 deficiency 01/14/2012  . B12 deficiency anemia   . BPH (benign prostatic hypertrophy)   . Cataract   . Chronic hyponatremia    [name] 01/13/2012  . Dysphagia   . Hypertension   . Mental retardation   . Pneumonia   . Psychogenic polydipsia   . Seizures ([place])    Past Surgical History:  Past Surgical History:  Procedure Laterality Date  . NO PAST SURGERIES     HPI:  84 yo M with history of MR presented with altered mental status.  Per the nursing home they felt that his speech has been different and that he has been less active than normal.  This been going on for the past 2 or 3 weeks. CXR shows possible left lower lobe PNA. Head CT negative for acute findings. Bedside swallow 01/14/12 with findings of normal swallow function, regular/thin liquids was recommended with no SLP follow-up.   Assessment / Plan / Recommendation Clinical Impression   Pt presents with clinical signs of aspiration due to what appears to be decreased sensation, delayed swallow initiation. Pt holds all boluses orally for an extended period, including thin via teaspoon, cup and straw sip. Swallow appears delayed and pt with wet vocal quality suggestive of decreased airway protection. Pt did not initiate swallow with puree despite dry spoon, tactile cues for ice chips, puree. SLP was unable to retrieve bolus, and ultimately pt did swallow when SLP placed cup to lips; again wet vocal quality persists. Difficult to assess if cognitively-based due to altered mental status or sensory/acute neuro change. Pt does not follow commands for oral motor assessment, though no focal  weakness or asymmetry noted. Recommend he remain NPO for now; will follow up next date for improvements at bedside vs need for instrumental assessment. D/w RN.     SLP Visit Diagnosis: Dysphagia, unspecified (R13.10)    Aspiration Risk  Severe aspiration risk    Diet Recommendation NPO        Other  Recommendations Oral Care Recommendations: Oral care QID Other Recommendations: Have oral suction available;Remove water pitcher   Follow up Recommendations Other (comment)(tbd)      Frequency and Duration min 2x/week  2 weeks       Prognosis Prognosis for Safe Diet Advancement: Good Barriers to Reach Goals: Cognitive deficits      Swallow Study   General Date of Onset: 07/10/18 HPI: 84 yo M with history of MR presented with altered mental status.  Per the nursing home they felt that his speech has been different and that he has been less active than normal.  This been going on for the past 2 or 3 weeks. CXR shows possible left lower lobe PNA. Head CT negative for acute findings. Bedside swallow 01/14/12 with findings of normal swallow function, regular/thin liquids was recommended with no SLP follow-up. Type of Study: Bedside Swallow Evaluation Previous Swallow Assessment: see HPI Diet Prior to this Study: NPO Temperature Spikes Noted: (92.1) Respiratory Status: Room air History of Recent Intubation: No Behavior/Cognition: Alert;Confused;Uncooperative;Doesn't follow directions Oral Cavity Assessment: Other (comment)(limited assessment; pt will not open mouth) Oral Care Completed by SLP: Other (Comment)(attempted; unable) Oral Cavity -  Dentition: Other (Comment)(appears to have few teeth) Self-Feeding Abilities: Total assist Patient Positioning: Upright in bed Baseline Vocal Quality: Wet Volitional Cough: Cognitively unable to elicit Volitional Swallow: Unable to elicit    Oral/Motor/Sensory Function Overall Oral Motor/Sensory Function: Other (comment)(no obvious asymmetry  or weakness; pt does have wet vocal qua)   Ice Chips Ice chips: Impaired Presentation: Spoon Oral Phase Impairments: Poor awareness of bolus;Reduced lingual movement/coordination Oral Phase Functional Implications: Oral holding;Prolonged oral transit   Thin Liquid Thin Liquid: Impaired Presentation: Cup;Straw Oral Phase Impairments: Poor awareness of bolus Oral Phase Functional Implications: Oral holding;Prolonged oral transit Pharyngeal  Phase Impairments: Suspected delayed Swallow;Wet Vocal Quality    Nectar Thick Nectar Thick Liquid: Not tested   Honey Thick Honey Thick Liquid: Not tested   Puree Puree: Impaired Presentation: Spoon Oral Phase Impairments: Poor awareness of bolus;Reduced lingual movement/coordination Oral Phase Functional Implications: Oral holding;Prolonged oral transit Pharyngeal Phase Impairments: Suspected delayed Swallow;Wet Vocal Quality   Solid     Solid: Not tested     [name], MS, CCC-SLP Speech-Language Pathologist Acute Rehabilitation Services Pager: [phone] Office: [phone]  [name] 07/10/2018,4:57 PM

## 2018-07-10 NOTE — Consult Note (Addendum)
Jesus Ramsey  VOH:[id number] DOB: 1931-04-18 DOA: 07/10/2018 PCP: [name], MD    LOS: 0 days   Reason for Consult / Chief Complaint:  Septic shock Consulting MD and date:  [name]   HPI/Summary of hospital stay:  84 year old w/c bound male w/ significant h/o mental retardation, seizure disorder, dysphagia, hypothyroidism and chronic hyponatremia.  He resides in nursing home for his multiple medical problems.  At baseline he is unable to provide pertinent history And is confused at baseline.  He was admitted on 9/1 to the internal medicine service From the skilled nursing home with report of worsening mental status.  Apparently has become less responsive, and was found to be hypothermic with temperature of 99 degrees.  An evaluation in the ER found to be hypotensive with systolic blood pressure as low as the 50s, he was somnolent, bradycardic.  And admitted with a working diagnosis of presumed sepsis secondary to probable aspiration pneumonia and possibly urinary tract infection.  He was transferred to the emergency room to the medical stepdown unit where he c in the 70s in spite of 4 L crystalloid administration since admission because of this critical care was asked to evaluate.  He was transferred to the intensive care for further therapy  Subjective:  Appears comfortable, denies pain  Objective   Blood pressure (Abnormal) 79/63, pulse 62, temperature (Abnormal) 92.7 F (33.7 C), temperature source Axillary, resp. rate 16, SpO2 100 %.        Intake/Output Summary (Last 24 hours) at 07/10/2018 1939 Last data filed at 07/10/2018 1359 Gross per 24 hour  Intake 2550 ml  Output no documentation  Net 2550 ml   There were no vitals filed for this visit.  Examination: General: Overly frail 84 year old male currently slow to respond HENT: Mucous membranes are moist neck veins flat normocephalic Lungs: Diminished throughout without accessory use Cardiovascular: Regular rate and  rhythm Abdomen: Soft, nontender Extremities: Lower extremity edema, dry skin, flaky skin, otherwise warm, with strong pulses Neuro: Awakens to voice, speech is slurred, speaks in one-word phrases, not oriented currently, moves all extremities GU: Cloudy yellow urine via catheter  Consults: date of consult/date signed off & final recs:   Critical care consulted on 9/1  Procedures: Left subclavian triple-lumen catheter placed 9/1>>>  Significant Diagnostic Tests: CT head 9/1:No acute intracranial abnormality.  There is moderate to severe generalized atrophy and mild chronic microvascular ischemia changes to the white matter  Micro Data: Urine culture 9/1 >>> Blood culture times two 9/1>>>  Antimicrobials:  Vancomycin 9/1 Cefepime 9/1  Resolved Hospital Problem list    Assessment & Plan:   Severe sepsis/septic shock: Likely secondary to urinary tract infection as well as possible aspiration versus hcap Admitted to the intensive care Central access placed Has had over 4 L of Crystalloid, lactic acid negative however remains hypotensive Plan Transduce CVP, goal 8-12 Levophed for mean arterial pressure greater then 65 Follow-up pending culture data Continue antibiotics, has been started on vancomycin and cefepime Check cortisol Continue telemetry monitoring Follow-up arterial blood gas Hold antihypertensives and diuretics  Dysphasia with aspiration pneumonia Portable chest x-ray personally reviewed demonstrates left basilar atelectasis as well as left basilar consolidation.  He is been seen by speech therapy who notes risk of aspiration, and clear evidence of dysphasia.  Chart review demonstrates dysphasia is a chronic issue with him Plan N.p.o. Antibiotics as above Pulse oximetry Pulmonary hygiene  Acute metabolic encephalopathy, superimposed on underlying cognitive decline/mental retardation. He  is wheelchair-bound at baseline Plan Continuing supportive  care  Hypothermia Likely sepsis however his TSH is elevated Plan Follow-up free T4 Treat sepsis Checking cortisol Will provide his supplemental Synthroid via IV route while n.p.o. Bear hugger as indicated  Elevated LFTs.  Likely secondary to shock liver. Does have elevated alk phos Plan Repeat a.m. LFTs If no improvement may need to consider right upper quadrant ultrasound  Anemia of chronic disease.  Currently no evidence of bleeding Plan Trend CBC Transfuse for protocol should hemoglobin be less than 7 or he develops symptomatic anemia  History of BPH Plan Flomax is able May need Foley catheter, try to avoid this with concern for infection  Hypoglycemia Plan Checking serial blood glucose  Disposition / Summary of Today's Plan 07/10/18   Sepsis septic shock source likely aspiration pneumonia plus minus urinary tract infection he is received over 30 mL/kg of crystalloid resuscitation and remains hypotensive although his lactic acid is negative.  She will access is been placed, will continue supportive care with IV antibiotics, adding norepinephrine to ensure mean arterial pressure greater than 65.  He also is hypothermic, and interestingly, his TSH is elevated.  We will follow-up cortisol, free T4, and ensure he gets his Synthroid supplementation  Best Practice / Goals of Care / Disposition.   DVT prophylaxis: Low molecular weight heparin GI prophylaxis: Not applicable Diet: N.p.o. except meds for now  Mobility: Addressed Code Status: Full code Family Communication: As of care discussed with his niece [name] who is also his healthcare power of attorney.  At this point he will be full code, with full resuscitation however they understand he has declined, they are also concerned about ensuring he does not suffer.  They are open to further discussion in regards to goals of care  Labs   CBC: Recent Labs  Lab 07/10/18 1019 07/10/18 1028  WBC 5.4  --   NEUTROABS 3.2  --    HGB 13.2 13.9  HCT 42.0 41.0  MCV 93.1  --   PLT 143*  --    Basic Metabolic Panel: Recent Labs  Lab 07/10/18 1019 07/10/18 1028 07/10/18 1546  NA 140 140 139  K 4.1 4.0 4.0  CL 101 100 104  CO2 31  --  28  GLUCOSE 71 67* 77  BUN 20 24* 20  CREATININE 0.84 0.90 0.83  CALCIUM 9.6  --  8.5*   GFR: CrCl cannot be calculated (Unknown ideal weight.). Recent Labs  Lab 07/10/18 1019 07/10/18 1027  PROCALCITON <0.10  --   WBC 5.4  --   LATICACIDVEN  --  1.06   Liver Function Tests: Recent Labs  Lab 07/10/18 1019  AST 87*  ALT 57*  ALKPHOS 137*  BILITOT 0.7  PROT 7.7  ALBUMIN 3.0*   No results for input(s): LIPASE, AMYLASE in the last 168 hours. No results for input(s): AMMONIA in the last 168 hours. ABG    Component Value Date/Time   TCO2 30 07/10/2018 1028    Coagulation Profile: Recent Labs  Lab 07/10/18 1910  INR 1.20   Cardiac Enzymes: No results for input(s): CKTOTAL, CKMB, CKMBINDEX, TROPONINI in the last 168 hours. HbA1C: Hgb A1c MFr Bld  Date/Time Value Ref Range Status  01/14/2012 12:05 AM 6.0 (H) <5.7 % Final    Comment:    (NOTE)  According to the ADA Clinical Practice Recommendations for 2011, when HbA1c is used as a screening test:  >=6.5%   Diagnostic of Diabetes Mellitus           (if abnormal result is confirmed) 5.7-6.4%   Increased risk of developing Diabetes Mellitus References:Diagnosis and Classification of Diabetes Mellitus,Diabetes NIOE,7035,00(XFGHW 1):S62-S69 and Standards of Medical Care in         Diabetes - 2011,Diabetes EXHB,7169,67 (Suppl 1):S11-S61.   CBG: Recent Labs  Lab 07/10/18 1851  GLUCAP 52*     Review of Systems:   Not able  Past medical history  He,  has a past medical history of Altered mental status, B12 deficiency (01/14/2012), B12 deficiency anemia, BPH (benign prostatic hypertrophy), Cataract, Chronic hyponatremia, Dysphagia,  Hypertension, Mental retardation, Pneumonia, Psychogenic polydipsia, and Seizures ([place]).   Surgical History    Past Surgical History:  Procedure Laterality Date  . NO PAST SURGERIES       Social History   Social History   Socioeconomic History  . Marital status: Single    Spouse name: Not on file  . Number of children: Not on file  . Years of education: Not on file  . Highest education level: Not on file  Occupational History  . Not on file  Social Needs  . Financial resource strain: Not on file  . Food insecurity:    Worry: Not on file    Inability: Not on file  . Transportation needs:    Medical: Not on file    Non-medical: Not on file  Tobacco Use  . Smoking status: Former Research scientist (life sciences)  . Smokeless tobacco: Never Used  Substance and Sexual Activity  . Alcohol use: No  . Drug use: No  . Sexual activity: Not on file  Lifestyle  . Physical activity:    Days per week: Not on file    Minutes per session: Not on file  . Stress: Not on file  Relationships  . Social connections:    Talks on phone: Not on file    Gets together: Not on file    Attends religious service: Not on file    Active member of club or organization: Not on file    Attends meetings of clubs or organizations: Not on file    Relationship status: Not on file  . Intimate partner violence:    Fear of current or ex partner: Not on file    Emotionally abused: Not on file    Physically abused: Not on file    Forced sexual activity: Not on file  Other Topics Concern  . Not on file  Social History Narrative  . Not on file  ,  reports that he has quit smoking. He has never used smokeless tobacco. He reports that he does not drink alcohol or use drugs.   Family history   His Family history is unknown by patient.   Allergies No Known Allergies  Home meds  Prior to Admission medications   Medication Sig Start Date End Date Taking? Authorizing Provider  acetaminophen (TYLENOL) 325 MG tablet Take 650 mg  by mouth every 6 (six) hours as needed. pain   Yes [provider]  aspirin 81 MG chewable tablet Chew 81 mg by mouth daily.   Yes [provider]  atorvastatin (LIPITOR) 10 MG tablet Take 10 mg by mouth every evening. 07/08/18  Yes [provider]  hydrochlorothiazide (HYDRODIURIL) 12.5 MG tablet Take 12.5 mg by mouth every morning. 06/29/18  Yes  [provider]  latanoprost (XALATAN) 0.005 % ophthalmic solution Place 1 drop into both eyes at bedtime.   Yes [provider]  levothyroxine (SYNTHROID, LEVOTHROID) 25 MCG tablet Take 25 mcg by mouth every morning. 07/06/18  Yes [provider]  magnesium hydroxide (MILK OF MAGNESIA) 400 MG/5ML suspension Take 30 mLs by mouth daily as needed. For constipation   Yes [provider]  Metoprolol Tartrate 75 MG TABS Take 75 mg by mouth 2 (two) times daily. Hold if SBP is <110 or HR <60   Yes [provider]  sodium chloride 1 G tablet Take 1 g by mouth every morning.   Yes [provider]  Tamsulosin HCl (FLOMAX) 0.4 MG CAPS Take 0.4 mg by mouth daily.    Yes [provider]  timolol (TIMOPTIC) 0.5 % ophthalmic solution Place 1 drop into both eyes every morning. 05/23/18  Yes [provider]  UNABLE TO FIND Take 1 each by mouth See admin instructions. Med Name: Frozen magic cup by mouth twice daily (lunch and dinner)   Yes [provider]     Erick Colace ACNP-BC [place] Pager # [phone] OR # [phone] if no answer

## 2018-07-10 NOTE — ED Provider Notes (Signed)
[place] EMERGENCY DEPARTMENT Provider Note   CSN: [id number] Arrival date & time: 07/10/18  4098     History   Chief Complaint Chief Complaint  Patient presents with  . Altered Mental Status    HPI Jesus Ramsey is a 84 y.o. male.  84 yo M with a chief complaint of altered mental status.  Per the nursing home they felt that he is speech has been different and that he has been less active than normal.  This been going on for the past 2 or 3 weeks.  The patient is able to respond to me, states that he has not had any new concerns.  Denies headache denies neck pain denies chest pain denies abdominal pain.  Patient has had multiple visits by [facility], their notes indicate that he is unable to help with HPI or ROS.  Level 5 caveat.  The history is provided by the patient.  Illness  This is a new problem. The current episode started more than 1 week ago. The problem occurs constantly. The problem has not changed since onset.Pertinent negatives include no chest pain, no abdominal pain, no headaches and no shortness of breath. Nothing aggravates the symptoms. Nothing relieves the symptoms. He has tried nothing for the symptoms. The treatment provided no relief.    Past Medical History:  Diagnosis Date  . Altered mental status   . B12 deficiency 01/14/2012  . B12 deficiency anemia   . BPH (benign prostatic hypertrophy)   . Cataract   . Chronic hyponatremia    [name] 01/13/2012  . Dysphagia   . Hypertension   . Mental retardation   . Pneumonia   . Psychogenic polydipsia   . Seizures [facility])     Patient Active Problem List   Diagnosis Date Noted  . Altered mental status 07/10/2018  . Dysphagia, pharyngoesophageal phase 04/08/2015  . Seizures ([place]) 08/10/2014  . Essential hypertension 08/10/2014  . Absolute anemia 08/10/2014  . Psychogenic polydipsia 01/14/2012  . BPH (benign prostatic hyperplasia) 01/14/2012  . Mental retardation 01/13/2012  .  Hyponatremia 01/13/2012    Past Surgical History:  Procedure Laterality Date  . NO PAST SURGERIES          Home Medications    Prior to Admission medications   Medication Sig Start Date End Date Taking? Authorizing Provider  acetaminophen (TYLENOL) 325 MG tablet Take 650 mg by mouth every 6 (six) hours as needed. pain    [provider]  aspirin 81 MG chewable tablet Chew 81 mg by mouth daily.    [provider]  latanoprost (XALATAN) 0.005 % ophthalmic solution Place 1 drop into both eyes at bedtime.    [provider]  magnesium hydroxide (MILK OF MAGNESIA) 400 MG/5ML suspension Take 30 mLs by mouth daily as needed. For constipation    [provider]  Metoprolol Tartrate 75 MG TABS Take 75 mg by mouth 2 (two) times daily.    [provider]  sodium chloride 1 G tablet Take 1 g by mouth every morning.    [provider]  Tamsulosin HCl (FLOMAX) 0.4 MG CAPS Take 0.4 mg by mouth daily.     [provider]  UNABLE TO FIND Med Name: Frozen magic cup by mouth three times daily    [provider]    Family History Family History  Family history unknown: Yes    Social History Social History   Tobacco Use  . Smoking status: Former Research scientist (life sciences)  .  Smokeless tobacco: Never Used  Substance Use Topics  . Alcohol use: No  . Drug use: No     Allergies   Patient has no known allergies.   Review of Systems Review of Systems  Constitutional: Positive for activity change. Negative for chills and fever.  HENT: Negative for congestion and facial swelling.   Eyes: Negative for discharge and visual disturbance.  Respiratory: Negative for shortness of breath.   Cardiovascular: Negative for chest pain and palpitations.  Gastrointestinal: Negative for abdominal pain, diarrhea and vomiting.  Musculoskeletal: Negative for arthralgias and myalgias.  Skin: Negative for color change and rash.  Neurological: Negative for  tremors, syncope and headaches.  Psychiatric/Behavioral: Negative for confusion and dysphoric mood.     Physical Exam Updated Vital Signs BP 100/67   Pulse (!) 51   Temp (S) (!) 89.2 F (31.8 C) (Rectal)   Resp 17   SpO2 100%   Physical Exam  Constitutional: He is oriented to person, place, and time. He appears well-developed and well-nourished.  Cool to touch  Cachectic chronically ill-appearing temporal muscle wasting  HENT:  Head: Normocephalic and atraumatic.  Eyes: Pupils are equal, round, and reactive to light. EOM are normal.  Neck: Normal range of motion. Neck supple. No JVD present.  Cardiovascular: Normal rate and regular rhythm. Exam reveals no gallop and no friction rub.  No murmur heard. Pulmonary/Chest: No respiratory distress. He has no wheezes.  Abdominal: He exhibits no distension and no mass. There is no tenderness. There is no rebound and no guarding.  Musculoskeletal: Normal range of motion.  Neurological: He is alert and oriented to person, place, and time.  Skin: No rash noted. No pallor.  Psychiatric: He has a normal mood and affect. His behavior is normal.  Nursing note and vitals reviewed.    ED Treatments / Results  Labs (all labs ordered are listed, but only abnormal results are displayed) Labs Reviewed  COMPREHENSIVE METABOLIC PANEL - Abnormal; Notable for the following components:      Result Value   Albumin 3.0 (*)    AST 87 (*)    ALT 57 (*)    Alkaline Phosphatase 137 (*)    All other components within normal limits  CBC WITH DIFFERENTIAL/PLATELET - Abnormal; Notable for the following components:   Platelets 143 (*)    All other components within normal limits  TSH - Abnormal; Notable for the following components:   TSH 5.121 (*)    All other components within normal limits  I-STAT CHEM 8, ED - Abnormal; Notable for the following components:   BUN 24 (*)    Glucose, Bld 67 (*)    All other components within normal limits    CULTURE, BLOOD (ROUTINE X 2)  CULTURE, BLOOD (ROUTINE X 2)  URINE CULTURE  URINALYSIS, ROUTINE W REFLEX MICROSCOPIC  T4  PROCALCITONIN  CBC  CREATININE, SERUM  I-STAT CG4 LACTIC ACID, ED  I-STAT TROPONIN, ED    EKG EKG Interpretation  Date/Time:  Sunday July 10 2018 09:58:34 EDT Ventricular Rate:  51 PR Interval:    QRS Duration: 135 QT Interval:  476 QTC Calculation: 439 R Axis:   71 Text Interpretation:  TECHNICALLY DIFFICULT background noise Nonspecific intraventricular conduction delay Probable anterolateral infarct, old Borderline ST depression, lateral leads Artifact in lead(s) I II III aVR aVL aVF V1 V2 V3 V5 V6 and baseline wander in lead(s) V3 Confirmed by Deno Etienne [phone]) on 07/10/2018 10:25:30 AM   Radiology Dg Chest 2 View  Result Date: 07/10/2018 CLINICAL DATA:  Sepsis. EXAM: CHEST - 2 VIEW COMPARISON:  07/24/2014 FINDINGS: Shallow lung inflation. Heart size is normal. There is tortuosity and calcification of the thoracic aorta. There is increased patchy density at the LEFT lung base, consistent with developing infiltrate or atelectasis. IMPRESSION: Developing LEFT LOWER lobe opacity, consistent atelectasis or infection. Aortic atherosclerosis.  (ICD10-I70.0) Electronically Signed   By: Nolon Nations M.D.   On: 07/10/2018 10:34   Ct Head Wo Contrast  Result Date: 07/10/2018 CLINICAL DATA:  84 year old with unexplained acute mental status changes. Patient is not currently eating or speaking which is a significant change from his baseline mental status. EXAM: CT HEAD WITHOUT CONTRAST TECHNIQUE: Contiguous axial images were obtained from the base of the skull through the vertex without intravenous contrast. COMPARISON:  07/24/2014, 01/13/2012 and earlier, including MRI brain 01/13/2012. FINDINGS: Brain: Moderate to severe cortical and deep atrophy, progressive since the prior CT in 2015. Mild changes of small vessel disease of the white matter diffusely, unchanged.  Physiologic calcifications in the basal ganglia. Calcified choroid plexus in the choroidal fissures bilaterally. No mass lesion. No midline shift. No acute hemorrhage or hematoma. No extra-axial fluid collections. No evidence of acute infarction. Vascular: Moderate BILATERAL carotid siphon and mild RIGHT vertebral artery atherosclerosis. No hyperdense vessel. Skull: No skull fracture or other focal osseous abnormality involving the skull. Sinuses/Orbits: Visualized paranasal sinuses, bilateral mastoid air cells and bilateral middle ear cavities well-aerated. Visualized orbits and globes normal in appearance. Other: None. IMPRESSION: 1. No acute intracranial abnormality. 2. Moderate to severe generalized atrophy and mild chronic microvascular ischemic changes of the white matter. Electronically Signed   By: Evangeline Dakin M.D.   On: 07/10/2018 11:47    Procedures Procedures (including critical care time)  Medications Ordered in ED Medications  ceFEPIme (MAXIPIME) 1 g in sodium chloride 0.9 % 100 mL IVPB (has no administration in time range)  vancomycin (VANCOCIN) 500 mg in sodium chloride 0.9 % 100 mL IVPB (has no administration in time range)  enoxaparin (LOVENOX) injection 40 mg (has no administration in time range)  acetaminophen (TYLENOL) tablet 650 mg (has no administration in time range)    Or  acetaminophen (TYLENOL) suppository 650 mg (has no administration in time range)  senna-docusate (Senokot-S) tablet 1 tablet (has no administration in time range)  magnesium hydroxide (MILK OF MAGNESIA) suspension 30 mL (has no administration in time range)  tamsulosin (FLOMAX) capsule 0.4 mg (has no administration in time range)  latanoprost (XALATAN) 0.005 % ophthalmic solution 1 drop (has no administration in time range)  sodium chloride 0.9 % bolus 1,000 mL (0 mLs Intravenous Stopped 07/10/18 1100)  vancomycin (VANCOCIN) IVPB 1000 mg/200 mL premix (1,000 mg Intravenous New Bag/Given 07/10/18 1130)    ceFEPIme (MAXIPIME) 2 g in sodium chloride 0.9 % 100 mL IVPB ( Intravenous Stopped 07/10/18 1214)  sodium chloride 0.9 % bolus 1,000 mL (0 mLs Intravenous Stopped 07/10/18 1230)    And  sodium chloride 0.9 % bolus 250 mL (250 mLs Intravenous New Bag/Given 07/10/18 1200)     Initial Impression / Assessment and Plan / ED Course  I have reviewed the triage vital signs and the nursing notes.  Pertinent labs & imaging results that were available during my care of the patient were reviewed by me and considered in my medical decision making (see chart for details).     84 yo M sent from a nursing home for altered mental status.  The patient appears alert and  is a bit hard of hearing.  He is noted to be confused on all his prior notes that are in the computer.  I am unsure of his baseline though this must be somewhat close.  He is significantly hypothermic with a temperature of 89.2 rectally.  Placed on the bear hugger and given warm fluids.  Will look for infectious cause.  Patient is chronically hypothyroid will obtain TSH and T4.  The patient's chest x-ray with a possible left lower lobe pneumonia.  Patient had a blood pressure that dipped below 90.  Code sepsis was initiated.  He was already given a liter of fluids will add 1250.  Started on broad-spectrum antibiotics as he is in a nursing home.  BP improved. Discussed with IM residency service will admit.   CRITICAL CARE Performed by: Cecilio Asper   Total critical care time: 35 minutes  Critical care time was exclusive of separately billable procedures and treating other patients.  Critical care was necessary to treat or prevent imminent or life-threatening deterioration.  Critical care was time spent personally by me on the following activities: development of treatment plan with patient and/or surrogate as well as nursing, discussions with consultants, evaluation of patient's response to treatment, examination of patient, obtaining  history from patient or surrogate, ordering and performing treatments and interventions, ordering and review of laboratory studies, ordering and review of radiographic studies, pulse oximetry and re-evaluation of patient's condition.  The patients results and plan were reviewed and discussed.   Any x-rays performed were independently reviewed by myself.   Differential diagnosis were considered with the presenting HPI.  Medications  ceFEPIme (MAXIPIME) 1 g in sodium chloride 0.9 % 100 mL IVPB (has no administration in time range)  vancomycin (VANCOCIN) 500 mg in sodium chloride 0.9 % 100 mL IVPB (has no administration in time range)  enoxaparin (LOVENOX) injection 40 mg (has no administration in time range)  acetaminophen (TYLENOL) tablet 650 mg (has no administration in time range)    Or  acetaminophen (TYLENOL) suppository 650 mg (has no administration in time range)  senna-docusate (Senokot-S) tablet 1 tablet (has no administration in time range)  magnesium hydroxide (MILK OF MAGNESIA) suspension 30 mL (has no administration in time range)  tamsulosin (FLOMAX) capsule 0.4 mg (has no administration in time range)  latanoprost (XALATAN) 0.005 % ophthalmic solution 1 drop (has no administration in time range)  sodium chloride 0.9 % bolus 1,000 mL (0 mLs Intravenous Stopped 07/10/18 1100)  vancomycin (VANCOCIN) IVPB 1000 mg/200 mL premix (1,000 mg Intravenous New Bag/Given 07/10/18 1130)  ceFEPIme (MAXIPIME) 2 g in sodium chloride 0.9 % 100 mL IVPB ( Intravenous Stopped 07/10/18 1214)  sodium chloride 0.9 % bolus 1,000 mL (0 mLs Intravenous Stopped 07/10/18 1230)    And  sodium chloride 0.9 % bolus 250 mL (250 mLs Intravenous New Bag/Given 07/10/18 1200)    Vitals:   07/10/18 1115 07/10/18 1139 07/10/18 1200 07/10/18 1230  BP: 102/77 114/72 103/72 100/67  Pulse: (!) 48 (!) 56 (!) 50 (!) 51  Resp: 14 15 16 17   Temp:      TempSrc:      SpO2: 100% 99% 99% 100%    Final diagnoses:  Hypovolemic  shock (HCC)  Hypothermia, initial encounter  Community acquired pneumonia of left lower lobe of lung ([place])  Hypothyroidism, unspecified type    Admission/ observation were discussed with the admitting physician, patient and/or family and they are comfortable with the plan.   Final Clinical Impressions(s) /  ED Diagnoses   Final diagnoses:  Hypovolemic shock ([place])  Hypothermia, initial encounter  Community acquired pneumonia of left lower lobe of lung ([place])  Hypothyroidism, unspecified type    ED Discharge Orders    None       Deno Etienne, DO 07/10/18 1308

## 2018-07-10 NOTE — Procedures (Signed)
Central Venous Catheter Insertion Procedure Note Jesus Ramsey [id number] [birth year]  Procedure: Insertion of Central Venous Catheter Indications: Assessment of intravascular volume, Drug and/or fluid administration and Frequent blood sampling  Procedure Details Consent: Risks of procedure as well as the alternatives and risks of each were explained to the (patient/caregiver).  Consent for procedure obtained. Time Out: Verified patient identification, verified procedure, site/side was marked, verified correct patient position, special equipment/implants available, medications/allergies/relevent history reviewed, required imaging and test results available.  Performed  Maximum sterile technique was used including antiseptics, cap, gloves, gown, hand hygiene, mask and sheet. Skin prep: Chlorhexidine; local anesthetic administered A antimicrobial bonded/coated triple lumen catheter was placed in the left subclavian vein using the Seldinger technique.  Evaluation Blood flow good Complications: No apparent complications Patient did tolerate procedure well. Chest X-ray ordered to verify placement.  CXR: pending.  Clementeen Graham 07/10/2018, 8:50 PM  Erick Colace ACNP-BC [place] Pager # [phone] OR # [phone] if no answer

## 2018-07-10 NOTE — Significant Event (Signed)
Rapid Response Event Note Hypotensive  Overview: Time Called: 1758 Arrival Time: 1800 Event Type: Hypotension  Initial Focused Assessment: Pt lying supine in bed, on bear hugger, has been non verbal since coming into ED, SBP 60-70's MAP 50's. CBG 52, axillary temp 92.7. IMTS to bedside, spoke with PCCM  Interventions: 500 cc NS bolus  1 amp dextrose  Transfer to 70m Levo gtt  Event Summary: Name of Physician Notified: Dr. Truman Hayward  at (PTA RRT )    at    Outcome: Transferred (Comment)(20m04)     Florentina Marquart, Sela Hua

## 2018-07-10 NOTE — Progress Notes (Signed)
Pt.  Looked comfortable under bear hugger.  Temp is slowly improving.  Retook BP and BP has dropped.  58/40, 68/46.  Called RR and Internal medicine.  Both came up to assess and care for patient at Bedside.  Bolusing patient with NS.

## 2018-07-10 NOTE — Progress Notes (Signed)
Spoke with patient's niece Desmen Schoffstall [phone]) and updated her about the patient's condition and being transfered to ICU. Clarified patient's code status and she re-confirmed that the patient is to be FULL CODE. She states she will be in [place] hopefully by tomorrow morning and she will try to come see the patient tomorrow morning. She also gave some more information about patient's baseline status. States he has had cognitive deficits for most of his life but is AAOx2 at baseline to name and place. States he has no known cardiac disease or cardiac events in the past that she is aware of.  -Gilberto Better Pager: [phone]

## 2018-07-10 NOTE — Progress Notes (Signed)
Pharmacy Antibiotic Note  Jesus Ramsey is a 84 y.o. male admitted on 07/10/2018 with sepsis suspected pneumonia.  Pharmacy has been consulted for Vancomycin and Cefepime dosing. Last available weight 68.9 kg. SCr 0.9.  Est CrCl ~ 61 mL/min. Cefepime 2g given in ED. WBC wnl. LA 1.06. Hypothermic on admission.   Plan: Vancomycin 1000 mg IV x1, then 500 mg IV every 12 hours Cefepime 1g IV q8h.  Follow-up for accurate weight Monitor renal function, culture results, and clinical status.      Temp (24hrs), Avg:89.2 F (31.8 C), Min:89.2 F (31.8 C), Max:89.2 F (31.8 C)  Recent Labs  Lab 07/10/18 1019 07/10/18 1027 07/10/18 1028  WBC 5.4  --   --   CREATININE 0.84  --  0.90  LATICACIDVEN  --  1.06  --     CrCl cannot be calculated (Unknown ideal weight.).    No Known Allergies  Antimicrobials this admission: Vancomycin 9/1 >> Cefepime 9/1 >>  Dose adjustments this admission:   Microbiology results: pending  Thank you for allowing pharmacy to be a part of this patient's care.  Sloan Leiter, PharmD, BCPS, BCCCP Clinical Pharmacist Clinical phone 07/10/2018 until 3:30PM [phone] Please refer to [facility] for [place] numbers 07/10/2018 11:57 AM

## 2018-07-10 NOTE — ED Notes (Signed)
Patient transported to X-ray 

## 2018-07-10 NOTE — Significant Event (Signed)
Rapid Response Event Note  Patient was transferred to 3M04, SBP was in the 100s, 20G PIV started in the LFA, great blood return, flushes well.

## 2018-07-11 DIAGNOSIS — J690 Pneumonitis due to inhalation of food and vomit: Secondary | ICD-10-CM

## 2018-07-11 DIAGNOSIS — Z515 Encounter for palliative care: Secondary | ICD-10-CM

## 2018-07-11 DIAGNOSIS — Z7189 Other specified counseling: Secondary | ICD-10-CM

## 2018-07-11 DIAGNOSIS — R401 Stupor: Secondary | ICD-10-CM

## 2018-07-11 DIAGNOSIS — G9341 Metabolic encephalopathy: Secondary | ICD-10-CM

## 2018-07-11 LAB — COMPREHENSIVE METABOLIC PANEL
ALK PHOS: 321 U/L — AB (ref 38–126)
ALT: 135 U/L — AB (ref 0–44)
ANION GAP: 4 — AB (ref 5–15)
AST: 168 U/L — ABNORMAL HIGH (ref 15–41)
Albumin: 2.2 g/dL — ABNORMAL LOW (ref 3.5–5.0)
BILIRUBIN TOTAL: 0.7 mg/dL (ref 0.3–1.2)
BUN: 15 mg/dL (ref 8–23)
CALCIUM: 8.3 mg/dL — AB (ref 8.9–10.3)
CO2: 29 mmol/L (ref 22–32)
CREATININE: 0.96 mg/dL (ref 0.61–1.24)
Chloride: 109 mmol/L (ref 98–111)
GFR calc non Af Amer: >60 mL/min (ref >60)
GLUCOSE: 91 mg/dL (ref 70–99)
Potassium: 4 mmol/L (ref 3.5–5.1)
Sodium: 142 mmol/L (ref 135–145)
TOTAL PROTEIN: 6.2 g/dL — AB (ref 6.5–8.1)

## 2018-07-11 LAB — URINE CULTURE: Culture: NO GROWTH

## 2018-07-11 LAB — CBC
HCT: 32.6 % — ABNORMAL LOW (ref 39.0–52.0)
Hemoglobin: 10.3 g/dL — ABNORMAL LOW (ref 13.0–17.0)
MCH: 29 pg (ref 26.0–34.0)
MCHC: 31.6 g/dL (ref 30.0–36.0)
MCV: 91.8 fL (ref 78.0–100.0)
PLATELETS: 143 10*3/uL — AB (ref 150–400)
RBC: 3.55 MIL/uL — AB (ref 4.22–5.81)
RDW: 14 % (ref 11.5–15.5)
WBC: 5 10*3/uL (ref 4.0–10.5)

## 2018-07-11 LAB — CORTISOL: CORTISOL PLASMA: 14.7 ug/dL

## 2018-07-11 LAB — PROCALCITONIN: Procalcitonin: <0.1 ng/mL

## 2018-07-11 LAB — T4: T4, Total: 8.1 ug/dL (ref 4.5–12.0)

## 2018-07-11 LAB — GLUCOSE, CAPILLARY
GLUCOSE-CAPILLARY: 69 mg/dL — AB (ref 70–99)
GLUCOSE-CAPILLARY: 93 mg/dL (ref 70–99)
Glucose-Capillary: 62 mg/dL — ABNORMAL LOW (ref 70–99)
Glucose-Capillary: 94 mg/dL (ref 70–99)

## 2018-07-11 MED ORDER — DEXTROSE 50 % IV SOLN
25.0000 mL | Freq: Once | INTRAVENOUS | Status: AC
  Administered 2018-07-11: 25 mL via INTRAVENOUS

## 2018-07-11 MED ORDER — DEXTROSE 50 % IV SOLN
INTRAVENOUS | Status: AC
  Filled 2018-07-11: qty 50

## 2018-07-11 MED ORDER — MUPIROCIN 2 % EX OINT
1.0000 "application " | TOPICAL_OINTMENT | Freq: Two times a day (BID) | CUTANEOUS | Status: DC
  Administered 2018-07-11 – 2018-07-15 (×9): 1 via NASAL
  Filled 2018-07-11: qty 22

## 2018-07-11 MED ORDER — DEXTROSE 10 % IV SOLN
INTRAVENOUS | Status: DC
  Administered 2018-07-12 – 2018-07-13 (×3): via INTRAVENOUS

## 2018-07-11 MED ORDER — CHLORHEXIDINE GLUCONATE CLOTH 2 % EX PADS
6.0000 | MEDICATED_PAD | Freq: Every day | CUTANEOUS | Status: AC
  Administered 2018-07-11 – 2018-07-15 (×5): 6 via TOPICAL

## 2018-07-11 MED ORDER — LACTATED RINGERS IV SOLN
INTRAVENOUS | Status: DC
  Administered 2018-07-12 – 2018-07-13 (×2): via INTRAVENOUS

## 2018-07-11 NOTE — Plan of Care (Signed)
IMTS to resume care 09/03 at 7am.

## 2018-07-11 NOTE — Progress Notes (Signed)
  Speech Language Pathology Treatment: Dysphagia  Patient Details Name: Jesus Ramsey MRN: [id number] DOB: 1931/08/28 Today's Date: 07/11/2018 Time: 7408-1448 SLP Time Calculation (min) (ACUTE ONLY): 10 min  Assessment / Plan / Recommendation Clinical Impression  Pt is resistant to oral care and PO trials offered via various delivery methods (spoon, swab, straw). Mod cues were provided for arousal prior to trials, although pt still keeps his eyes closed. What small amounts of POs he does get in his oral cavity, he promptly spits back out. Cognitively, he is not ready for PO trials. MBS would not be warranted yet either. SLP will continue to follow for differential diagnosis as able.   HPI HPI: 84 yo M with history of MR presented with altered mental status.  Per the nursing home they felt that his speech has been different and that he has been less active than normal.  This been going on for the past 2 or 3 weeks. CXR shows possible left lower lobe PNA. Head CT negative for acute findings. Bedside swallow 01/14/12 with findings of normal swallow function, regular/thin liquids was recommended with no SLP follow-up.      SLP Plan  Continue with current plan of care       Recommendations  Diet recommendations: NPO Medication Administration: Via alternative means                Oral Care Recommendations: Oral care QID Follow up Recommendations: (tba) SLP Visit Diagnosis: Dysphagia, unspecified (R13.10) Plan: Continue with current plan of care       GO                [name] 07/11/2018, 10:04 AM  [name], M.A. [place] Acute Environmental education officer [phone] Office [phone]

## 2018-07-11 NOTE — Progress Notes (Signed)
eLink Physician-Brief Progress Note Patient Name: Jesus Ramsey DOB: 11/13/1930 MRN: [id number]   Date of Service  07/11/2018  HPI/Events of Note  Hypoglycemia - Blood glucose = 62.  eICU Interventions  Will order: 1. D/C D5 LR IV infusion. 2. D10W IV infusion to run IV at 65 mL/hour. 3. LR IV infusion to run at 40 mL/hour.      Intervention Category Major Interventions: Other:  [name] 07/11/2018, 11:49 PM

## 2018-07-11 NOTE — Consult Note (Signed)
Consultation Note Date: 07/11/2018   Patient Name: Jesus Ramsey  DOB: 21-Mar-1931  MRN: [id number]  Age / Sex: 84 y.o., male  PCP: [name], MD Referring Physician: Aldean Jewett, MD  Reason for Consultation: Establishing goals of care and Psychosocial/spiritual support  HPI/Patient Profile: 84 y.o. male  admitted on 07/10/2018 with  a past medical history significant for delayed cognitive development (reportedly oriented to person and place at baseline/ wheelchair bound for the last several years living at [facility]), essential hypertension, hypothyroidism, and benign prostatic hypertrophy who presents with altered mental status for the last 2-3 weeks.   Admitted for treatment and stabilization 2/2 sepsis due to aspiration pneumonia of left lower lobe.  Family face treatment option decisions, advanced directive decisions and anticipatory care needs.     Clinical Assessment and Goals of Care:  This NP Wadie Lessen reviewed medical records, received report from team, assessed the patient and then meet at the patient's bedside along with [name] Areola/HPOA/neice  to discuss diagnosis, prognosis, GOC, EOL wishes disposition and options.  Concept of  Palliative Care was discussed  A  discussion was had today regarding advanced directives.  Concepts specific to code status, artifical feeding and hydration, continued IV antibiotics and rehospitalization was had.  The difference between a aggressive medical intervention path  and a palliative comfort care path for this patient at this time was had.  Values and goals of care important to patient and family were attempted to be elicited.  Continued discussion regarding MOST form.  Family did have the opportunity to discuss details of the form as discussed last night with CCM.  Decisions and parameters around level of care have been made  -No compressions, no shock, no intubation      -Continue all other medical interventions to prolong life and hopefully return patient to baseline  Natural trajectory and expectations at EOL were discussed, specifically to artificial hydration and antibiotic use.  Questions and concerns addressed.   Family encouraged to call with questions or concerns.    PMT will continue to support holistically.   HCPOA-documents copied and in hard chart    SUMMARY OF RECOMMENDATIONS    Code Status/Advance Care Planning:  Limited code   Palliative Prophylaxis:   Aspiration, Bowel Regimen, Delirium Protocol and Oral Care   Psycho-social/Spiritual:   Desire for further Chaplaincy support:yes   Prognosis:   Unable to determine-clearly dependent on decisions for life prolonging measures  Discharge Planning: To Be Determined      Primary Diagnoses: Present on Admission: . Altered mental status   I have reviewed the medical record, interviewed the patient and family, and examined the patient. The following aspects are pertinent.  Past Medical History:  Diagnosis Date  . Altered mental status   . B12 deficiency 01/14/2012  . B12 deficiency anemia   . BPH (benign prostatic hypertrophy)   . Cataract   . Chronic hyponatremia    [name] 01/13/2012  . Dysphagia   . Hypertension   . Mental  retardation   . Pneumonia   . Psychogenic polydipsia   . Seizures ([place])    Social History   Socioeconomic History  . Marital status: Single    Spouse name: Not on file  . Number of children: Not on file  . Years of education: Not on file  . Highest education level: Not on file  Occupational History  . Not on file  Social Needs  . Financial resource strain: Not on file  . Food insecurity:    Worry: Not on file    Inability: Not on file  . Transportation needs:    Medical: Not on file    Non-medical: Not on file  Tobacco Use  . Smoking status: Former Research scientist (life sciences)  . Smokeless tobacco: Never  Used  Substance and Sexual Activity  . Alcohol use: No  . Drug use: No  . Sexual activity: Not on file  Lifestyle  . Physical activity:    Days per week: Not on file    Minutes per session: Not on file  . Stress: Not on file  Relationships  . Social connections:    Talks on phone: Not on file    Gets together: Not on file    Attends religious service: Not on file    Active member of club or organization: Not on file    Attends meetings of clubs or organizations: Not on file    Relationship status: Not on file  Other Topics Concern  . Not on file  Social History Narrative  . Not on file   Family History  Family history unknown: Yes   Scheduled Meds: . Chlorhexidine Gluconate Cloth  6 each Topical Q0600  . enoxaparin (LOVENOX) injection  40 mg Subcutaneous Q24H  . latanoprost  1 drop Both Eyes QHS  . levothyroxine  12.5 mcg Intravenous Daily  . mupirocin ointment  1 application Nasal BID  . tamsulosin  0.4 mg Oral Daily   Continuous Infusions: . ceFEPime (MAXIPIME) IV Stopped (07/11/18 0408)  . dextrose 5% lactated ringers 100 mL/hr at 07/11/18 0900  . norepinephrine (LEVOPHED) Adult infusion 2 mcg/min (07/11/18 0900)  . vancomycin Stopped (07/11/18 0034)   PRN Meds:. Medications Prior to Admission:  Prior to Admission medications   Medication Sig Start Date End Date Taking? Authorizing Provider  acetaminophen (TYLENOL) 325 MG tablet Take 650 mg by mouth every 6 (six) hours as needed. pain   Yes [provider]  aspirin 81 MG chewable tablet Chew 81 mg by mouth daily.   Yes [provider]  atorvastatin (LIPITOR) 10 MG tablet Take 10 mg by mouth every evening. 07/08/18  Yes [provider]  hydrochlorothiazide (HYDRODIURIL) 12.5 MG tablet Take 12.5 mg by mouth every morning. 06/29/18  Yes [provider]  latanoprost (XALATAN) 0.005 % ophthalmic solution Place 1 drop into both eyes at bedtime.   Yes [provider]    levothyroxine (SYNTHROID, LEVOTHROID) 25 MCG tablet Take 25 mcg by mouth every morning. 07/06/18  Yes [provider]  magnesium hydroxide (MILK OF MAGNESIA) 400 MG/5ML suspension Take 30 mLs by mouth daily as needed. For constipation   Yes [provider]  Metoprolol Tartrate 75 MG TABS Take 75 mg by mouth 2 (two) times daily. Hold if SBP is <110 or HR <60   Yes [provider]  sodium chloride 1 G tablet Take 1 g by mouth every morning.   Yes [provider]  Tamsulosin HCl (FLOMAX) 0.4 MG CAPS Take 0.4 mg  by mouth daily.    Yes [provider]  timolol (TIMOPTIC) 0.5 % ophthalmic solution Place 1 drop into both eyes every morning. 05/23/18  Yes [provider]  UNABLE TO FIND Take 1 each by mouth See admin instructions. Med Name: Frozen magic cup by mouth twice daily (lunch and dinner)   Yes [provider]   No Known Allergies Review of Systems  Unable to perform ROS: Acuity of condition    Physical Exam  Constitutional: He appears lethargic. He appears ill.  Cardiovascular: Normal rate, regular rhythm and normal heart sounds.  Pulmonary/Chest: Effort normal and breath sounds normal.  Neurological: He appears lethargic.  Skin: Skin is warm and dry.    Vital Signs: BP 135/77   Pulse 79   Temp (!) 97.3 F (36.3 C) (Axillary)   Resp 18   Ht 5\' 11"  (1.803 m)   Wt 67.4 kg   SpO2 100%   BMI 20.72 kg/m  Pain Scale: PAINAD   Pain Score: 0-No pain   SpO2: SpO2: 100 % O2 Device:SpO2: 100 % O2 Flow Rate: .   IO: Intake/output summary:   Intake/Output Summary (Last 24 hours) at 07/11/2018 1009 Last data filed at 07/11/2018 0900 Gross per 24 hour  Intake 4104.27 ml  Output 1075 ml  Net 3029.27 ml    LBM:   Baseline Weight: Weight: 67.4 kg Most recent weight: Weight: 67.4 kg     Palliative Assessment/Data: 30%   Discussed with Dr Valeta Harms  Time In: 1230 Time Out: 1345 Time Total: 75 minutes Greater than 50%  of  this time was spent counseling and coordinating care related to the above assessment and plan.  Signed by: Wadie Lessen, NP   Please contact Palliative Medicine Team phone at [phone] for questions and concerns.  For individual provider: See Shea Evans

## 2018-07-11 NOTE — Progress Notes (Signed)
eLink Physician-Brief Progress Note Patient Name: Jesus Ramsey DOB: [birth year] MRN: [id number]   Date of Service  07/11/2018  HPI/Events of Note  Hypoglycemia - Blood glucose = 69. Given dextrose by bedside nurse who requests scheduled Q 4 hour POC BG checks  eICU Interventions  Will order: 1. POC BG Q 4 hours.      Intervention Category Major Interventions: Other:  [name] 07/11/2018, 7:58 PM

## 2018-07-11 NOTE — Progress Notes (Signed)
Patient's heart rate decreased to 30's sinus bradycardic on monitor and then back to 70's . Dr Valeta Harms informed

## 2018-07-11 NOTE — Progress Notes (Signed)
Jesus Ramsey  IRW:[id number] DOB: 1931-03-22 DOA: 07/10/2018 PCP: [name], MD    LOS: 1 day   Reason for Consult / Chief Complaint:  Septic shock Consulting MD and date:  [name]   HPI/Summary of hospital stay:  84 year old w/c bound male w/ significant h/o mental retardation, seizure disorder, dysphagia, hypothyroidism and chronic hyponatremia.  He resides in nursing home for his multiple medical problems.  At baseline he is unable to provide pertinent history And is confused at baseline.  He was admitted on 9/1 to the internal medicine service From the skilled nursing home with report of worsening mental status.  Apparently has become less responsive, and was found to be hypothermic with temperature of 99 degrees.  An evaluation in the ER found to be hypotensive with systolic blood pressure as low as the 50s, he was somnolent, bradycardic.  And admitted with a working diagnosis of presumed sepsis secondary to probable aspiration pneumonia and possibly urinary tract infection.  He was transferred to the emergency room to the medical stepdown unit where he c in the 70s in spite of 4 L crystalloid administration since admission because of this critical care was asked to evaluate.  He was transferred to the intensive care for further therapy  Subjective:  Comfortable, wearing mittens. Non-conversant.   Objective   Blood pressure 128/65, pulse 70, temperature (!) 96.1 F (35.6 C), temperature source Axillary, resp. rate 13, height 5' 11"  (1.803 m), weight 67.4 kg, SpO2 100 %. CVP:  [2 mmHg-64 mmHg] 64 mmHg      Intake/Output Summary (Last 24 hours) at 07/11/2018 1357 Last data filed at 07/11/2018 1300 Gross per 24 hour  Intake 2346.91 ml  Output 1275 ml  Net 1071.91 ml   Filed Weights   07/10/18 2045 07/11/18 0338  Weight: 67.4 kg 67.4 kg    Examination: General appearance: 84 y.o., male, chronically ill apearing, no verbal response, combative  Eyes: anicteric sclerae, moist  conjunctivae HENT: NCAT; oropharynx, MMM, Neck: Trachea midline; thin neck, muscle wasting  Lungs: no crackles no wheeze, diminished bilaterally  CV: RRR, S1, S2,, distant heart tones, no over murmur  Abdomen: Soft, non-tender; non-distended, BS present  Extremities: No peripheral edema or radial and DP pulses present bilaterally  Skin: Normal temperature, turgor and texture; no rash Neuro: moving all 4 ext spontaneously, not conversant, per family his baseline    Consults: date of consult/date signed off & final recs:   Critical care consulted on 9/1  Procedures: Left subclavian triple-lumen catheter placed 9/1>>>  Significant Diagnostic Tests: CT head 9/1:No acute intracranial abnormality.  There is moderate to severe generalized atrophy and mild chronic microvascular ischemia changes to the white matter  Micro Data: Urine culture 9/1 >>> Blood culture times two 9/1>>>  Antimicrobials:  Vancomycin 9/1 Cefepime 9/1  Resolved Hospital Problem list    Assessment & Plan:   Severe sepsis/septic shock: Likely secondary to urinary tract infection as well as possible aspiration versus hcap Admitted to the intensive care Central access placed Has had over 4 L of Crystalloid, lactic acid negative however remains hypotensive Plan Continue vanco and cefepime  Wean NEpi to maintain MAP > 101mHg  Cultures pending  Dysphasia with aspiration pneumonia Portable chest x-ray personally reviewed demonstrates left basilar atelectasis as well as left basilar consolidation.  He is been seen by speech therapy who notes risk of aspiration, and clear evidence of dysphasia.  Chart review demonstrates dysphasia is a chronic issue with him  Plan NPO, should be seen by SLP Known prior documented aspiration risk  Continue Abx, Cx pending   Acute metabolic encephalopathy, superimposed on underlying cognitive decline/mental retardation. He is wheelchair-bound at baseline Plan Continuing supportive  care  Hypothermia Likely related to sepsis and age  Plan Improved with resolution of sepsis  No longer on bear hugger   Elevated LFTs.  Likely secondary to shock liver. Does have elevated alk phos Plan Monitoring LFTs  Avoid liver toxic drugs   Anemia of chronic disease.  Currently no evidence of bleeding Plan Follow H&H Transfuse Hgb <7 No since of active bleeding   History of BPH Plan On flomax if able to take  Condom cath for the time being  Scan bladder if needed for residual will plan for foley   Hypoglycemia Plan CBGs q4, with SSI if needed   Chronically Ill, Chronically Debilitated  Agree with palliative care evaluation  We appreciate their recommendations as well as change in code status CCM agrees that this is appropriate due to the patients comorbidities   Disposition / Summary of Today's Plan 07/11/18   Weaning from [place] with palliative   Best Practice / Goals of Care / Disposition.   DVT prophylaxis: Low molecular weight heparin GI prophylaxis: Not applicable Diet: N.p.o. except meds for now  Mobility: will need PT/OT assessment  Code Status:   Code Status: Partial Code Family Communication: met with [place] at bedside   Labs   CBC: Recent Labs  Lab 07/10/18 1019 07/10/18 1028 07/10/18 1910 07/11/18 0455  WBC 5.4  --  3.7* 5.0  NEUTROABS 3.2  --   --   --   HGB 13.2 13.9 9.6* 10.3*  HCT 42.0 41.0 30.5* 32.6*  MCV 93.1  --  91.9 91.8  PLT 143*  --  121* 229*   Basic Metabolic Panel: Recent Labs  Lab 07/10/18 1019 07/10/18 1028 07/10/18 1546 07/10/18 1910 07/11/18 0455  NA 140 140 139 137 142  K 4.1 4.0 4.0 3.7 4.0  CL 101 100 104 106 109  CO2 31  --  28 24 29   GLUCOSE 71 67* 77 186* 91  BUN 20 24* 20 20 15   CREATININE 0.84 0.90 0.83 0.84 0.96  CALCIUM 9.6  --  8.5* 7.7* 8.3*   GFR: Estimated Creatinine Clearance: 53.6 mL/min (by C-G formula based on SCr of 0.96 mg/dL). Recent Labs  Lab 07/10/18 1019  07/10/18 1027 07/10/18 1910 07/10/18 2321 07/11/18 0455  PROCALCITON <0.10  --   --   --  <0.10  WBC 5.4  --  3.7*  --  5.0  LATICACIDVEN  --  1.06 0.8 0.8  --    Liver Function Tests: Recent Labs  Lab 07/10/18 1019 07/10/18 1910 07/11/18 0455  AST 87* 220* 168*  ALT 57* 144* 135*  ALKPHOS 137* 337* 321*  BILITOT 0.7 0.6 0.7  PROT 7.7 5.5* 6.2*  ALBUMIN 3.0* 2.1* 2.2*   No results for input(s): LIPASE, AMYLASE in the last 168 hours. No results for input(s): AMMONIA in the last 168 hours. ABG    Component Value Date/Time   PHART 7.401 07/10/2018 2037   PCO2ART 40.1 07/10/2018 2037   PO2ART 87.0 07/10/2018 2037   HCO3 25.7 07/10/2018 2037   TCO2 27 07/10/2018 2037   O2SAT 98.0 07/10/2018 2037    Coagulation Profile: Recent Labs  Lab 07/10/18 1910  INR 1.20   Cardiac Enzymes: No results for input(s): CKTOTAL, CKMB, CKMBINDEX, TROPONINI in the  last 168 hours. HbA1C: Hgb A1c MFr Bld  Date/Time Value Ref Range Status  01/14/2012 12:05 AM 6.0 (H) <5.7 % Final    Comment:    (NOTE)                                                                       According to the ADA Clinical Practice Recommendations for 2011, when HbA1c is used as a screening test:  >=6.5%   Diagnostic of Diabetes Mellitus           (if abnormal result is confirmed) 5.7-6.4%   Increased risk of developing Diabetes Mellitus References:Diagnosis and Classification of Diabetes Mellitus,Diabetes VZDG,3875,64(PPIRJ 1):S62-S69 and Standards of Medical Care in         Diabetes - 2011,Diabetes JOAC,1660,63 (Suppl 1):S11-S61.   CBG: Recent Labs  Lab 07/10/18 1851 07/10/18 2039  GLUCAP 52* 111*    This patient is critically ill with multiple organ system failure; which, requires frequent high complexity decision making, assessment, support, evaluation, and titration of therapies. This was completed through the application of advanced monitoring technologies and extensive interpretation of multiple  databases. During this encounter critical care time was devoted to patient care services described in this note for 32 minutes.   Garner Nash, DO [facility] 07/11/2018 1:58 PM  Personal pager: [phone] If unanswered, please page CCM On-call: [phone]

## 2018-07-12 ENCOUNTER — Inpatient Hospital Stay (HOSPITAL_COMMUNITY): Payer: Medicare Other

## 2018-07-12 DIAGNOSIS — R609 Edema, unspecified: Secondary | ICD-10-CM

## 2018-07-12 DIAGNOSIS — M7989 Other specified soft tissue disorders: Secondary | ICD-10-CM

## 2018-07-12 DIAGNOSIS — R627 Adult failure to thrive: Secondary | ICD-10-CM

## 2018-07-12 DIAGNOSIS — Z79899 Other long term (current) drug therapy: Secondary | ICD-10-CM

## 2018-07-12 DIAGNOSIS — R1310 Dysphagia, unspecified: Secondary | ICD-10-CM

## 2018-07-12 DIAGNOSIS — R4182 Altered mental status, unspecified: Secondary | ICD-10-CM

## 2018-07-12 DIAGNOSIS — M79609 Pain in unspecified limb: Secondary | ICD-10-CM

## 2018-07-12 LAB — CBC WITH DIFFERENTIAL/PLATELET
Abs Immature Granulocytes: 0 10*3/uL (ref 0.0–0.1)
Basophils Absolute: 0 10*3/uL (ref 0.0–0.1)
Basophils Relative: 1 %
EOS ABS: 0.3 10*3/uL (ref 0.0–0.7)
EOS PCT: 7 %
HEMATOCRIT: 31.1 % — AB (ref 39.0–52.0)
Hemoglobin: 10 g/dL — ABNORMAL LOW (ref 13.0–17.0)
Immature Granulocytes: 0 %
LYMPHS PCT: 38 %
Lymphs Abs: 1.6 10*3/uL (ref 0.7–4.0)
MCH: 29.4 pg (ref 26.0–34.0)
MCHC: 32.2 g/dL (ref 30.0–36.0)
MCV: 91.5 fL (ref 78.0–100.0)
Monocytes Absolute: 0.5 10*3/uL (ref 0.1–1.0)
Monocytes Relative: 12 %
NEUTROS ABS: 1.8 10*3/uL (ref 1.7–7.7)
NEUTROS PCT: 42 %
Platelets: 136 10*3/uL — ABNORMAL LOW (ref 150–400)
RBC: 3.4 MIL/uL — AB (ref 4.22–5.81)
RDW: 14.2 % (ref 11.5–15.5)
WBC: 4.2 10*3/uL (ref 4.0–10.5)

## 2018-07-12 LAB — GLUCOSE, CAPILLARY
GLUCOSE-CAPILLARY: 60 mg/dL — AB (ref 70–99)
GLUCOSE-CAPILLARY: 99 mg/dL (ref 70–99)
GLUCOSE-CAPILLARY: 53 mg/dL — AB (ref 70–99)
GLUCOSE-CAPILLARY: 48 mg/dL — AB (ref 70–99)
GLUCOSE-CAPILLARY: 85 mg/dL (ref 70–99)
Glucose-Capillary: 71 mg/dL (ref 70–99)
Glucose-Capillary: 72 mg/dL (ref 70–99)
Glucose-Capillary: 67 mg/dL — ABNORMAL LOW (ref 70–99)
Glucose-Capillary: 55 mg/dL — ABNORMAL LOW (ref 70–99)
Glucose-Capillary: 69 mg/dL — ABNORMAL LOW (ref 70–99)

## 2018-07-12 LAB — BASIC METABOLIC PANEL
Anion gap: 4 — ABNORMAL LOW (ref 5–15)
BUN: 10 mg/dL (ref 8–23)
CO2: 30 mmol/L (ref 22–32)
CREATININE: 0.9 mg/dL (ref 0.61–1.24)
Calcium: 8.4 mg/dL — ABNORMAL LOW (ref 8.9–10.3)
Chloride: 105 mmol/L (ref 98–111)
GFR calc non Af Amer: >60 mL/min (ref >60)
GLUCOSE: 85 mg/dL (ref 70–99)
Potassium: 3.9 mmol/L (ref 3.5–5.1)
Sodium: 139 mmol/L (ref 135–145)

## 2018-07-12 LAB — PROCALCITONIN: Procalcitonin: <0.1 ng/mL

## 2018-07-12 LAB — GLUCOSE, RANDOM: Glucose, Bld: 98 mg/dL (ref 70–99)

## 2018-07-12 MED ORDER — DEXTROSE 50 % IV SOLN
INTRAVENOUS | Status: AC
  Administered 2018-07-12: 50 mL
  Filled 2018-07-12: qty 50

## 2018-07-12 MED ORDER — DEXTROSE 50 % IV SOLN: 50.0000 mL | Freq: Once | INTRAVENOUS | Status: AC

## 2018-07-12 NOTE — Progress Notes (Signed)
CBG at 1630 was 67 again I called Dr Truman Hayward and gave another Dextrose 50% 50 mls per order and I rechecked CBG at 1730 by capillary stick and venous draw resulted at 53 and 55             Agei and Dr Truman Hayward notified also stat glucose ordered and drawn

## 2018-07-12 NOTE — Progress Notes (Signed)
The patient was sleep at the time of this visit. Chaplains will follow up at a later time. Chaplain Yaakov Guthrie [phone]

## 2018-07-12 NOTE — Progress Notes (Signed)
   07/12/18 1100  Clinical Encounter Type  Visited With Patient  Visit Type Initial  Referral From Nurse  Consult/Referral To Chaplain  Spiritual Encounters  Spiritual Needs Prayer;Emotional   Chaplain visited unresponsive PT. Chaplain offered soul care with sacred text reading and brief end of life care.

## 2018-07-12 NOTE — Progress Notes (Signed)
SLP Cancellation Note  Patient Details Name: Jesus Ramsey MRN: [id number] DOB: 1931/09/17   Cancelled treatment:       Reason Eval/Treat Not Completed: Patient at procedure or test/unavailable   [name] 07/12/2018, 1:19 PM  [name], M.A. [place] Acute Environmental education officer [phone] Office [phone]

## 2018-07-12 NOTE — Progress Notes (Signed)
Patient ID: Jesus Ramsey, male   DOB: [birth year], 84 y.o.   MRN: [id number]  This NP visited patient at the bedside as a follow up to  yesterday's [place].  Patient remains minimally responsive, unable to follow commands, able to take p.o. intake.  Placed call to niece and healthcare power of attorney Jesus Ramsey for continued conversation regarding diagnosis, prognosis, goals of care.  We discussed the patient's high risk for decompensation and the importance of ongoing goal setting depending on patient response to medical interventions.  Discussed with family the importance of continued conversation with the family and the  medical providers regarding overall plan of care and treatment options,  ensuring decisions are in the patient's best interest and within the  context of the patients values and GOCs.  Questions and concerns addressed   Discussed with Dr Truman Hayward and Phyllis/bedsdie RN  Total time spent on the unit was 35 minutes  Greater than 50% of the time was spent in counseling and coordination of care  Wadie Lessen NP  Palliative Medicine Team Team Phone # [phone] Pager [phone]

## 2018-07-12 NOTE — Progress Notes (Signed)
Subjective:  Jesus Ramsey is a 84 y.o. with PMH of BPh, Dysphagia, Developmental cognitive delays, and htn admit for AMS and Hypothermia on hospital day 2  [name] was examined and evaluated at bedside this morning.  He is unresponsive and unable to answer questions.  He grimaces to sternal rub. His mentation is significantly worse from his admission when he was AAO x 1 to place and able to answer some questions. Chart review shows that palliative have been working with family to transition patient to comfort care. On partial code at the moment with pressors ok but no compressions, intubation or cardioversion.  Objective:  Vital signs in last 24 hours: Vitals:   07/12/18 0749 07/12/18 0800 07/12/18 0900 07/12/18 1000  BP:  (!) 144/86 (!) 151/91   Pulse:  84 78 73  Resp:  16 20 (!) 25  Temp: (!) 97.1 F (36.2 C)     TempSrc: Axillary     SpO2:  100% 98% 100%  Weight:      Height:       Physical Exam  Constitutional: He appears distressed.  Cachetic appearing male   HENT:  Head: Normocephalic and atraumatic.  Mouth/Throat: Oropharyngeal exudate (whitish sputum around the mouth) present.  Temporal wasting  Eyes:  Refusing to open eyes  Neck: Normal range of motion. Neck supple. No tracheal deviation present. No thyromegaly present.  Cardiovascular: Normal rate, regular rhythm, normal heart sounds and intact distal pulses.  Pulmonary/Chest: Effort normal and breath sounds normal. No respiratory distress. He has no wheezes. He has no rales.  Abdominal: Soft. Bowel sounds are normal. He exhibits no distension. There is no guarding.  Musculoskeletal: Normal range of motion. He exhibits edema (1+ pitting edema on left lower extremity).  Lymphadenopathy:    He has no cervical adenopathy.  Neurological:  GCS 7: No eye opening, inappropriate words, Flexion to pain  Skin: Skin is warm and dry. He is not diaphoretic.   Assessment/Plan:  Principal Problem:   Hypothermia Active  Problems:   Altered mental status   Severe sepsis with septic shock (Jesus Ramsey)   Hypothyroidism   Community acquired pneumonia of left lower lobe of lung (Jesus Ramsey)   Acute cystitis without hematuria   DNR (do not resuscitate) discussion   Acute metabolic encephalopathy   Aspiration pneumonia due to gastric secretions [facility])   Palliative care by specialist   [name] is a 84 yo M w/ PMH of BPH, Hypothyroidism, HTN admit hypothermia, hypoglycemia and altered mental status. He is being transferred out of the ICU his BP has stabilized off pressors. Continuing to endorse hypothermia and hypoglycemia and AMS. Still unclear etiology but working with presumed pneumonia as the cause. However calcitonin negative, blood culture negative so far and no leukocytosis. Considering hypotensive event requiring pressors, will repeat CT head for anoxic injury and work up for possible DVT/PE. Will transfer to step-down.  Altered mental status 2/2 aspiration pneumonia vs anoxic brain injury Presented with 'speech changes and lowered activity level.' Worsening mentation after episode of hypotension requiring pressors in ICU. Currently BP stable off pressors but continuing to be minimally responsive. GCS 7. Presented with 89.78F on rectal temp but is now 97.22F. Currently on bear hugger. Will continue with empiric treatment and work up. WBC (4.2) MRSA PCR + Urine/blood culture so far negative - CT head wo contrast - F/u Urine Culture/Blood culture - C/w cefepime, Vanc per pharmacy  - Telemetry - C/w bair hugger  - C/w maintenance fluid: Lactated ringer 40cc/hr, Dextrose  10% 65cc/hr - Hold home bp med: metoprolol 75mg  BID - Will contact nursing home facility to get more information on possible inciting events - Palliative on board for goals of care  Lower extremity swelling 2/2 DVT vs Lymphedema Has 2+ pitting edema on left greater than right. Unclear of mobility status. 3+ on [place]. - DVT ultrasound of left lower  extremity  Dispo: Anticipated discharge in approximately 4 day(s).   Diet: NPO DVT prophx: Lovenox Bowel: Milk of Magnesia, Senokot Code: Partial (pressors ok but no compressions, cardioversion, intubation)  [name], MD 07/12/2018, 10:55 AM Pager: [phone]

## 2018-07-12 NOTE — Progress Notes (Signed)
Preliminary notes--Bilateral lower extremities venous duplex exam completed. Negative for DVT where exam can approach.  Limited access due to patient's extreme combativeness.  Incidental finding:  Left popliteal fossa, there is a greater than 4.04x1.72cm complex cystic non-vascular structure in the medial aspect.  Jesus Ramsey (RDMS RVT) 07/12/18 5:30 PM

## 2018-07-12 NOTE — Progress Notes (Signed)
Hypoglycemic Event  CBG:60  Treatment: D50 IV 50 mL  Symptoms: None  Follow-up CBG: Time:1320 CBG Result:99  Possible Reasons for Event: Inadequate meal intake  Comments/MD notified Dr Truman Hayward called and informed no furthur orders    Lunette Stands

## 2018-07-12 NOTE — Progress Notes (Signed)
Spoke with nursing staff at [facility]. He's been decline for past few weeks. At baseline he is usually able to ambulate without assistance. He is very independent and able to speak in full coherent sentences. Nursing staff noted that he has been taking more baths recently and also have started to develop slurred speech, incoherent sentence slowly over 2-3 weeks without any inciting events. He also began to have difficulty feeding himself with loss of coordination. Also he began to be unable to concentrate on his daily tasks. Nursing staff noted that this kind of altered mentation has never occurred with him in the past.

## 2018-07-12 NOTE — Progress Notes (Signed)
Patient BG is 69 at 2000 despite patient being on D10 at 100 ml/hr. Paged IM MD and gave an amp of D50 per order. Will recheck BG per protocol.

## 2018-07-12 NOTE — Plan of Care (Signed)
Retook BG 3 times due to patient fighting and resisting. BG at 2148 of 22  and 2156 48 is not accurate. The third BG was 89 at 2205

## 2018-07-12 NOTE — Progress Notes (Signed)
  Date: 07/12/2018  Patient name: Jesus Ramsey  Medical record number: [id number]  Date of birth: 12/19/1930   I have seen and evaluated this patient and I have discussed the plan of care with the house staff. Please see their note for complete details. I concur with their findings with the following additions/corrections: No longer hypothermic (but on bear hugger), no longer hypotensive, still hypoglycemic despite D 10.  Working diagnosis is severe sepsis secondary to aspiration pneumonia of the left lower lobe.  However, procalcitonin has been negative. He has not had a leukocytosis.  He has not had hypoxia or required supplemental oxygen.  Blood cultures are negative to date as is his urine culture.  He remains on cefepime and vanc.   Today, his mental status was more altered than when when the team last saw him on September 1.  We obtained a CT of the head to rule out infarct due to his severe hypotension.  The CT was severely degraded due to motion but nothing overt was seen.  Other etiology to be considered would be nonconvulsive status epilepticus.  His SNF note in May of this year indicated a seizure disorder but he is not on any antiepileptic medications.  However, this would not fit with his reported 2 to 3 weeks of symptom progression.  Additionally, inadvertent medication administration with either a sulfonylurea or insulin could cause his hypoglycemia, hypotension, and hypothermia.  However, once again his 2 to 3-week history of symptom onset is not consistent with this.  Transfer out of the ICU.  Try to obtain additional information from his SNF to ensure that this has been gradually coming on over 2 to 3 weeks.  Also try to get more history about his seizure disorder.  Continue to follow palliative care goals of care discussion.  Bartholomew Crews, MD 07/12/2018, 2:21 PM

## 2018-07-13 DIAGNOSIS — R627 Adult failure to thrive: Secondary | ICD-10-CM

## 2018-07-13 DIAGNOSIS — M7122 Synovial cyst of popliteal space [Baker], left knee: Secondary | ICD-10-CM

## 2018-07-13 LAB — CBC
HCT: 30.3 % — ABNORMAL LOW (ref 39.0–52.0)
Hemoglobin: 9.8 g/dL — ABNORMAL LOW (ref 13.0–17.0)
MCH: 29.7 pg (ref 26.0–34.0)
MCHC: 32.3 g/dL (ref 30.0–36.0)
MCV: 91.8 fL (ref 78.0–100.0)
PLATELETS: 124 10*3/uL — AB (ref 150–400)
RBC: 3.3 MIL/uL — AB (ref 4.22–5.81)
RDW: 14 % (ref 11.5–15.5)
WBC: 4.6 10*3/uL (ref 4.0–10.5)

## 2018-07-13 LAB — COMPREHENSIVE METABOLIC PANEL
ALT: 72 U/L — AB (ref 0–44)
ANION GAP: 5 (ref 5–15)
AST: 60 U/L — ABNORMAL HIGH (ref 15–41)
Albumin: 2.2 g/dL — ABNORMAL LOW (ref 3.5–5.0)
Alkaline Phosphatase: 222 U/L — ABNORMAL HIGH (ref 38–126)
BUN: 8 mg/dL (ref 8–23)
CALCIUM: 8.4 mg/dL — AB (ref 8.9–10.3)
CHLORIDE: 103 mmol/L (ref 98–111)
CO2: 30 mmol/L (ref 22–32)
CREATININE: 0.78 mg/dL (ref 0.61–1.24)
Glucose, Bld: 126 mg/dL — ABNORMAL HIGH (ref 70–99)
Potassium: 3.8 mmol/L (ref 3.5–5.1)
Sodium: 138 mmol/L (ref 135–145)
Total Bilirubin: 0.8 mg/dL (ref 0.3–1.2)
Total Protein: 6.4 g/dL — ABNORMAL LOW (ref 6.5–8.1)

## 2018-07-13 LAB — GLUCOSE, CAPILLARY
GLUCOSE-CAPILLARY: 108 mg/dL — AB (ref 70–99)
Glucose-Capillary: 108 mg/dL — ABNORMAL HIGH (ref 70–99)
Glucose-Capillary: 109 mg/dL — ABNORMAL HIGH (ref 70–99)
Glucose-Capillary: 131 mg/dL — ABNORMAL HIGH (ref 70–99)
Glucose-Capillary: 22 mg/dL — CL (ref 70–99)
Glucose-Capillary: 77 mg/dL (ref 70–99)

## 2018-07-13 LAB — CORTISOL-AM, BLOOD: CORTISOL - AM: 12.9 ug/dL (ref 6.7–22.6)

## 2018-07-13 MED ORDER — ADULT MULTIVITAMIN W/MINERALS CH
1.0000 | ORAL_TABLET | Freq: Every day | ORAL | Status: DC
  Administered 2018-07-13 – 2018-07-14 (×2): 1 via ORAL
  Filled 2018-07-13 (×3): qty 1

## 2018-07-13 MED ORDER — ENSURE ENLIVE PO LIQD
237.0000 mL | Freq: Two times a day (BID) | ORAL | Status: DC
  Administered 2018-07-13 – 2018-07-15 (×4): 237 mL via ORAL

## 2018-07-13 NOTE — Evaluation (Signed)
Physical Therapy Evaluation Patient Details Name: Jesus Ramsey MRN: [id number] DOB: 1931/04/01 Today's Date: 07/13/2018   History of Present Illness  84 y.o. with PMH of BPh, Dysphagia, Developmental cognitive delays, and htn admit for AMS and Hypothermia  Clinical Impression  Pt difficult to understand due to dysphagia, and hypophonia and unable to convey prior level of function. From chart pt was independent with ambulation and able to bathe and dress himself. Pt also spoke with clarity at SNF. Pt is currently limited in mobility by decreased cognition only follow commands 50% of the time, as well as decreased strength and mobility. Pt requires mod A for supine to sit from bed in egress position and HoB elevated and requires max Ax2 for sit<>stand. PT recommends SNF level rehab at d/c. PT will continue to follow acutely.     Follow Up Recommendations SNF    Equipment Recommendations  None recommended by PT       Precautions / Restrictions Precautions Precautions: Fall Restrictions Weight Bearing Restrictions: No      Mobility  Bed Mobility Overal bed mobility: Needs Assistance Bed Mobility: Rolling;Supine to Sit Rolling: Mod assist   Supine to sit: Mod assist     General bed mobility comments: bed placed in egress and with full back support pt requires mod A to come to sitting without assist, with rolling for pericare pt requires mod A, pt was able to come almost into longsitting during pericare and required redirection to lay back down   Transfers Overall transfer level: Needs assistance Equipment used: 2 person hand held assist Transfers: Sit to/from Stand Sit to Stand: Max assist;+2 physical assistance         General transfer comment: maxAx2 for egress of end of bed from chair position, pt with bilateral knee instability and unable to come to fully upright   Ambulation/Gait             General Gait Details: unable to attempt        Balance Overall  balance assessment: Needs assistance Sitting-balance support: Feet supported;Feet unsupported;Bilateral upper extremity supported Sitting balance-Leahy Scale: Fair                                       Pertinent Vitals/Pain Pain Assessment: Faces Faces Pain Scale: No hurt    Home Living Family/patient expects to be discharged to:: Skilled nursing facility                      Prior Function Level of Independence: Independent with assistive device(s)         Comments: pt unable to provide information, however from chart SNF contacted and reported pt previously was independent in ambulation and able to bathe and dress him self, however in the last 2-3 weeks has had a decline in function     Hand Dominance       Extremity/Trunk Assessment   Upper Extremity Assessment Upper Extremity Assessment: RUE deficits/detail;LUE deficits/detail;Difficult to assess due to impaired cognition RUE Deficits / Details: reduced AROM, shoulder flexion <90 degrees, lacking full elbow extension. strength grossly assessed at 3/5 LUE Deficits / Details: reduced AROM, shoulder flexion <90 degrees, lacking full elbow extension. strength grossly assessed at 2+/5    Lower Extremity Assessment Lower Extremity Assessment: RLE deficits/detail;LLE deficits/detail;Difficult to assess due to impaired cognition RLE Deficits / Details: AROM WFL, strength grossly assessed at 3+/5,  LLE  Deficits / Details: knee lacking full extension, strength grossly assessed at 2+/5         Communication   Communication: No difficulties  Cognition Arousal/Alertness: Awake/alert Behavior During Therapy: Flat affect Overall Cognitive Status: No family/caregiver present to determine baseline cognitive functioning                                 General Comments: appears to be decrease from report from SNF       General Comments General comments (skin integrity, edema, etc.): VSS          Assessment/Plan    PT Assessment Patient needs continued PT services  PT Problem List Decreased strength;Decreased range of motion;Decreased activity tolerance;Decreased balance;Decreased mobility;Decreased coordination;Decreased cognition;Decreased safety awareness       PT Treatment Interventions DME instruction;Gait training;Functional mobility training;Therapeutic activities;Therapeutic exercise;Balance training;Cognitive remediation;Patient/family education    PT Goals (Current goals can be found in the Care Plan section)  Acute Rehab PT Goals Patient Stated Goal: none stated PT Goal Formulation: Patient unable to participate in goal setting Time For Goal Achievement: 07/27/18 Potential to Achieve Goals: Fair    Frequency Min 2X/week    AM-PAC PT "6 Clicks" Daily Activity  Outcome Measure Difficulty turning over in bed (including adjusting bedclothes, sheets and blankets)?: Unable Difficulty moving from lying on back to sitting on the side of the bed? : Unable Difficulty sitting down on and standing up from a chair with arms (e.g., wheelchair, bedside commode, etc,.)?: Unable Help needed moving to and from a bed to chair (including a wheelchair)?: Total Help needed walking in hospital room?: Total Help needed climbing 3-5 steps with a railing? : Total 6 Click Score: 6    End of Session Equipment Utilized During Treatment: Gait belt Activity Tolerance: Patient tolerated treatment well Patient left: in bed;with call bell/phone within reach;with nursing/sitter in room Nurse Communication: Mobility status PT Visit Diagnosis: Unsteadiness on feet (R26.81);Other abnormalities of gait and mobility (R26.89);Muscle weakness (generalized) (M62.81);Difficulty in walking, not elsewhere classified (R26.2);Other symptoms and signs involving the nervous system (R29.898)    Time: 1405-1430 PT Time Calculation (min) (ACUTE ONLY): 25 min   Charges:   PT Evaluation $PT Eval  Moderate Complexity: 1 Mod PT Treatments $Therapeutic Activity: 8-22 mins        Perina Salvaggio B. Migdalia Dk PT, DPT Acute Rehabilitation Services Pager [phone] Office [phone]   [place] 07/13/2018, 5:30 PM

## 2018-07-13 NOTE — Progress Notes (Signed)
Report called to [phone]

## 2018-07-13 NOTE — Progress Notes (Signed)
Received from 11M at this time. Sitter at bedside

## 2018-07-13 NOTE — Progress Notes (Addendum)
CSW is aware that patient is a resident at [facility] facility and will continue to follow.   Percell Locus Arra Connaughton LCSW [phone]

## 2018-07-13 NOTE — Progress Notes (Signed)
Pharmacy Antibiotic Note  Jesus Ramsey is a 84 y.o. male admitted on 07/10/2018 with sepsis suspected pneumonia. On day #4 vancomycin/cefepime. All cultures are negative with the exception of her nares which are mrsa positive, likely colonization.    Plan: Vancomycin 500 mg IV q12 hours - recommend discontinuing  Cefepime 1g IV q8h Monitor renal function, culture results, and clinical status.    Height: 5\' 11"  (180.3 cm) Weight: 156 lb 15.5 oz (71.2 kg) IBW/kg (Calculated) : 75.3  Temp (24hrs), Avg:97.2 F (36.2 C), Min:96.2 F (35.7 C), Max:98.4 F (36.9 C)  Recent Labs  Lab 07/10/18 1019 07/10/18 1027  07/10/18 1546 07/10/18 1910 07/10/18 2321 07/11/18 0455 07/12/18 0338 07/13/18 0345  WBC 5.4  --   --   --  3.7*  --  5.0 4.2 4.6  CREATININE 0.84  --    < > 0.83 0.84  --  0.96 0.90 0.78  LATICACIDVEN  --  1.06  --   --  0.8 0.8  --   --   --    < > = values in this interval not displayed.     Antimicrobials this admission: Vancomycin 9/1 >> Cefepime 9/1 >>  Dose adjustments this admission: N/A  Microbiology results: 9/1 blood cx: 9/1 urine cx: 9/1 mrsa pcr: pos   Jesus Ramsey, Jesus Ramsey 07/13/2018 9:38 AM

## 2018-07-13 NOTE — Progress Notes (Signed)
Paged by RN that Jesus Ramsey was pulling at his lines and trying to pull off his mittens. Evaluated the patient at bedside. He was alert only to person. He denied any pain and stated that he was comfortable. The patient was consistently pulling at his mittens. Given the patient's need for IV access and his disorientation, ordered soft restraints. At this time, also increased his maintenance D10 from 121ml/hr to 125 ml/hr due to downtrending glucoses since his last D50 bolus and NPO status.

## 2018-07-13 NOTE — Progress Notes (Signed)
  Date: 07/13/2018  Patient name: Jesus Ramsey  Medical record number: [id number]  Date of birth: 1930-12-15   I have seen and evaluated this patient and I have discussed the plan of care with the house staff. Please see their note for complete details. I concur with their findings with the following additions/corrections: [name] was seen on morning rounds and is much improved in regards to his mental status today compared to yesterday.  He was alert and talking and oriented x1.  He was able to follow simple commands and could appropriately answer most questions.  Nursing informed us that he required restraints and mittens due to pulling on his necessary medical equipment.  We still do not know the etiology of his hypotension, hypothermia, and hypoglycemia.  No bacterial infection to be found so we are stopping his antibiotics.  We are assessing for an insulinoma since he is still requiring D5 to maintain his glucose.  However, if he has had decreased intake over the past 2 to 3 weeks, that could also account for his hypoglycemia.  I now doubt that nonconvulsive status epilepticus is the etiology due to the remarkable improvement from yesterday to today.  He is stable to be transferred to a non-telemetry floor with a sitter for safety I appreciate palliative care this is of the same regarding goals of care.  We will get PT and OT to see the patient.  Speech therapy has recommended a dysphagia 1 diet.  He is not yet ready to be transferred back to his SNF as he is still requiring D5 to maintain euglycemia.  Hopefully as his oral intake increases, will be able to get him off the D5.  Bartholomew Crews, MD 07/13/2018, 4:28 PM

## 2018-07-13 NOTE — Progress Notes (Signed)
  Speech Language Pathology Treatment: Dysphagia  Patient Details Name: Jesus Ramsey MRN: [id number] DOB: 1931-04-03 Today's Date: 07/13/2018 Time: 8676-7209 SLP Time Calculation (min) (ACUTE ONLY): 13 min  Assessment / Plan / Recommendation Clinical Impression  Pt's mentation is significantly improved compared to previous visits, now with better awareness and sustained attention for oral intake. He had an immediate cough on initial bolus of thin liquid, but no further coughing could be elicited even with completion of 3 oz water challenge. He does have multiple swallows across trials, and mastication is limited given that he is missing most of his teeth. Per description, it sounds like he eats soft textures ("pea soup") unless food is in "tiny pieces." Recommend starting with Dys 1 diet and thin liquids with full supervision. Will f/u for tolerance and potential for advancement.   HPI HPI: 84 yo M with history of MR presented with altered mental status.  Per the nursing home they felt that his speech has been different and that he has been less active than normal.  This been going on for the past 2 or 3 weeks. CXR shows possible left lower lobe PNA. Head CT negative for acute findings. Bedside swallow 01/14/12 with findings of normal swallow function, regular/thin liquids was recommended with no SLP follow-up.      SLP Plan  Continue with current plan of care       Recommendations  Diet recommendations: Dysphagia 1 (puree);Thin liquid Liquids provided via: Straw Medication Administration: Crushed with puree Supervision: Staff to assist with self feeding;Full supervision/cueing for compensatory strategies Compensations: Slow rate;Small sips/bites Postural Changes and/or Swallow Maneuvers: Seated upright 90 degrees;Upright 30-60 min after meal                Oral Care Recommendations: Oral care BID Follow up Recommendations: Skilled Nursing facility SLP Visit Diagnosis: Dysphagia,  unspecified (R13.10) Plan: Continue with current plan of care       GO                Jesus Ramsey 07/13/2018, 11:27 AM  Jesus Ramsey, M.A. [place] Acute Environmental education officer [phone] Office [phone]

## 2018-07-13 NOTE — Progress Notes (Signed)
Initial Nutrition Assessment  DOCUMENTATION CODES:   Severe malnutrition in context of chronic illness  INTERVENTION:   Ensure Enlive po BID, each supplement provides 350 kcal and 20 grams of protein  Magic cup TID with meals, each supplement provides 290 kcal and 9 grams of protein  MVI with minerals daily  NUTRITION DIAGNOSIS:   Severe Malnutrition related to (FTT, dysphagia) as evidenced by severe fat depletion, severe muscle depletion.  GOAL:   Patient will meet greater than or equal to 90% of their needs  MONITOR:   PO intake, Supplement acceptance, Labs, Weight trends  REASON FOR ASSESSMENT:   Malnutrition Screening Tool    ASSESSMENT:   84 yo male admitted with severe sepsis secondary to aspiration pneumonia and AMS, FTT. PMH includes HTN,  delayed cognitive development and resides at [facility]. Per nursing staff, pt is usually indepenent and ambulatory   Pt alert, intermittently coherent (difficult to understand). Pt with 1:1 sitter.   Diet advanced to Dysphagia I, Thins today. Pt has been NPO since admission on 09/01. No meal intake yet.   Pt reports he likes "pea soup and pizza." Reports he is hungry and wants to eat. He also says he likes vanilla ice cream, RD to order Magic Cup TID.  Pt with poor dentition; assessed by SLP today  Noted hypoglycemic yesterday, CBGs 22-85, D10 infusion started, CBGs improved  LE very tight on exam; difficult to assess Pt with very dry, scaly appearing skin all over.   Per weight encounters, no significant weight loss. Current wt 71.2 kg. Weight up since admission, pt net +7 L since admission. Admission wt   Labs: reviewed Meds: D10@ 125  NUTRITION - FOCUSED PHYSICAL EXAM:    Most Recent Value  Orbital Region  Severe depletion  Upper Arm Region  Severe depletion  Thoracic and Lumbar Region  Moderate depletion  Buccal Region  Moderate depletion  Temple Region  Severe depletion  Clavicle Bone Region  Severe  depletion  Clavicle and Acromion Bone Region  Severe depletion  Scapular Bone Region  Severe depletion  Dorsal Hand  Moderate depletion  Patellar Region  Unable to assess  Anterior Thigh Region  Unable to assess  Posterior Calf Region  Unable to assess  Edema (RD Assessment)  Moderate  Skin  -- [very dry, scaly]       Diet Order:   Diet Order            DIET - DYS 1 Room service appropriate? Yes; Fluid consistency: Thin  Diet effective now              EDUCATION NEEDS:   Not appropriate for education at this time  Skin:  Skin Assessment: Skin Integrity Issues:(no pressure ulcer noted) Skin Integrity Issues:: Other (Comment) Other: MASD: buttock  Last BM:  no documented BM  Height:   Ht Readings from Last 1 Encounters:  07/10/18 5\' 11"  (1.803 m)    Weight:   Wt Readings from Last 1 Encounters:  07/13/18 71.2 kg    Ideal Body Weight:     BMI:  Body mass index is 21.89 kg/m.  Estimated Nutritional Needs:   Kcal:  1700-1900 kcals   Protein:  85-95 g  Fluid:  >/= 1.7 L   Kerman Passey MS, RD, LDN, CNSC [phone] Pager  [phone] Weekend/On-Call Pager

## 2018-07-13 NOTE — NC FL2 (Signed)
[place] MEDICAID FL2 LEVEL OF CARE SCREENING TOOL     IDENTIFICATION  Patient Name: Jesus Ramsey Birthdate: 12/02/1930 Sex: male Admission Date (Current Location): 07/10/2018  [facility] and [place] Number:  Herbalist and Address:  [facility] [place], [place] [address], [place], [place] [address]      Provider Number: [id number]  Attending Physician Name and Address:  Bartholomew Crews, MD  Relative Name and Phone Number:       Current Level of Care: Hospital Recommended Level of Care: [place] Prior Approval Number:    Date Approved/Denied:   PASRR Number: [id number]  Discharge Plan: SNF    Current Diagnoses: Patient Active Problem List   Diagnosis Date Noted  . Adult failure to thrive   . Acute metabolic encephalopathy   . Aspiration pneumonia due to gastric secretions ([place])   . Palliative care by specialist   . Altered mental status 07/10/2018  . Hypothermia 07/10/2018  . Severe sepsis with septic shock ([place])   . Hypothyroidism   . Community acquired pneumonia of left lower lobe of lung ([place])   . Acute cystitis without hematuria   . DNR (do not resuscitate) discussion   . Dysphagia, pharyngoesophageal phase 04/08/2015  . Seizures ([place]) 08/10/2014  . Essential hypertension 08/10/2014  . Absolute anemia 08/10/2014  . Psychogenic polydipsia 01/14/2012  . BPH (benign prostatic hyperplasia) 01/14/2012  . Mental retardation 01/13/2012  . Hyponatremia 01/13/2012    Orientation RESPIRATION BLADDER Height & Weight     Self  Normal Incontinent, External catheter Weight: 71.2 kg Height:  5\' 11"  (180.3 cm)  BEHAVIORAL SYMPTOMS/MOOD NEUROLOGICAL BOWEL NUTRITION STATUS      Continent Diet(Please see DC Summary)  AMBULATORY STATUS COMMUNICATION OF NEEDS Skin   Extensive Assist Verbally Normal                       Personal Care Assistance Level of Assistance  Bathing, Feeding, Dressing Bathing Assistance: Maximum  assistance Feeding assistance: Maximum assistance Dressing Assistance: Maximum assistance     Functional Limitations Info  Sight, Hearing, Speech Sight Info: Adequate Hearing Info: Adequate Speech Info: Adequate    SPECIAL CARE FACTORS FREQUENCY  PT (By licensed PT), OT (By licensed OT)     PT Frequency: 5x/week OT Frequency: 3x/week            Contractures Contractures Info: Not present    Additional Factors Info  Code Status, Allergies, Isolation Precautions Code Status Info: Partial Allergies Info: NKA     Isolation Precautions Info: Contact precautions     Current Medications (07/13/2018):  This is the current hospital active medication list Current Facility-Administered Medications  Medication Dose Route Frequency Provider Last Rate Last Dose  . Chlorhexidine Gluconate Cloth 2 % PADS 6 each  6 each Topical Q0600 Icard, Bradley L, DO   6 each at 07/13/18 0537  . dextrose 10 % infusion   Intravenous Continuous Dorrell, Andree Elk, MD 125 mL/hr at 07/13/18 1500    . enoxaparin (LOVENOX) injection 40 mg  40 mg Subcutaneous Q24H Neva Seat, MD   40 mg at 07/13/18 1501  . feeding supplement (ENSURE ENLIVE) (ENSURE ENLIVE) liquid 237 mL  237 mL Oral BID BM Bartholomew Crews, MD   237 mL at 07/13/18 1502  . latanoprost (XALATAN) 0.005 % ophthalmic solution 1 drop  1 drop Both Eyes QHS Neva Seat, MD   1 drop at 07/12/18 2108  . levothyroxine (SYNTHROID,  LEVOTHROID) injection 12.5 mcg  12.5 mcg Intravenous Daily Erick Colace, NP   12.5 mcg at 07/13/18 0957  . mupirocin ointment (BACTROBAN) 2 % 1 application  1 application Nasal BID Icard, Bradley L, DO   1 application at 28/76/81 0957  . tamsulosin (FLOMAX) capsule 0.4 mg  0.4 mg Oral Daily Neva Seat, MD   0.4 mg at 07/13/18 1502     Discharge Medications: Please see discharge summary for a list of discharge medications.  Relevant Imaging Results:  Relevant Lab Results:   Additional  Information SSN: [place], [place]

## 2018-07-13 NOTE — Progress Notes (Signed)
Subjective:  Jesus Ramsey is a 84 y.o. with PMH of  admit for dysphagia, bph, htn admit for AMS on hospital day 3  Jesus Ramsey was examined and evaluated at bedside this a.m.  His mentation has improved significantly.  He is AAO x1 to name. He is able to follow directions. His speech is intermittently coherent.  He states he is a little bit hungry and would like to eat. Able to request for his glasses. Able to tell us his favorite food and his prior occupation.  He was still on soft restraints and mittens per nursing staff as he was pulling at his IV lines. Denies any headache, nausea, vomiting.  Objective:  Vital signs in last 24 hours: Vitals:   07/13/18 0800 07/13/18 0900 07/13/18 1000 07/13/18 1100  BP: (!) 161/97 (!) 151/90 (!) 139/91 (!) 146/71  Pulse: 65 71 62 (!) 58  Resp: 17 17 19 15   Temp:      TempSrc:      SpO2: 100% 100% 100% 100%  Weight:      Height:       Physical Exam  Constitutional: No distress.  In soft restraints and mittens  HENT:  Head: Normocephalic and atraumatic.  Mouth/Throat: Oropharyngeal exudate (whitish sputum visualized on his lips) present.  Temporal wasting  Eyes: Pupils are equal, round, and reactive to light. Conjunctivae and EOM are normal. No scleral icterus.  Neck: Normal range of motion. Neck supple. No thyromegaly present.  Cardiovascular: Normal rate, regular rhythm, normal heart sounds and intact distal pulses.  Pulmonary/Chest: Effort normal and breath sounds normal. No respiratory distress. He has no wheezes. He has no rales.  Abdominal: Soft. Bowel sounds are normal. He exhibits no distension. There is no tenderness. There is no guarding.  Musculoskeletal: Normal range of motion. He exhibits edema (1+ pitting edema on left leg. Trace pitting edema on right).  Neurological: He is alert. No cranial nerve deficit. GCS score is 15.  Skin: Skin is warm and dry. He is not diaphoretic.  Psychiatric: Mood and affect normal.      Assessment/Plan:  Principal Problem:   Hypothermia Active Problems:   Altered mental status   Severe sepsis with septic shock (HCC)   Hypothyroidism   Community acquired pneumonia of left lower lobe of lung ([place])   Acute cystitis without hematuria   DNR (do not resuscitate) discussion   Acute metabolic encephalopathy   Aspiration pneumonia due to gastric secretions [facility])   Palliative care by specialist   Adult failure to thrive   Jesus Ramsey is a 84 yo M w/ PMH of BPH, Hypothyroidism, HTN admit hypothermia, hypoglycemia and altered mental status.Mentation significantly improved today. Compared to his baseline based on conversation with his nursing home staff he does not appear to be quite back at baseline but definitely much improved from yesterday. Off bair warmers. Continuing to be hypoglycemic on D10W. Increasing the rate to keep hm from hypoglycemic events. Will transfer to general floor.  Altered mental status 2/2 hypothermia vs anoxic brain injury vs hypoglycemia Mentation improving continuing to endorse hypoglycemia and no longer hypothermic. Difficult to assess origin of his hypothermia as this presenting sign appear to be the cause of his original altered mental status. CT head unhelpful as patient was agitated and moving during the test. Urine/Blood culture negative. AM cortisol WNL (12.9) T4 WNL (8.1) - F/u random insulin - Stop cefepime, Vanc - D/C Telemetry - C/w maintenance fluid: Dextrose 10% 125cc/hr - Palliative on board for goals  of care - Speech and swallow - PT eval  Lower extremity swelling 2/2 popliteal fossa cyst LLE ultrasound show no DVT but 4.04x1.71cm complex cyst - will monitor for now, if patient endorse pain or difficulty walking, may need to tap  Diet: NPO until cleared by speech and swallow DVT prophx: Lovenox Bowel: Milk of Magnesia, Senokot Code: Partial (pressors ok but no compressions, cardioversion, intubation)  Dispo: Anticipated  discharge in approximately 2 day(s).   Jesus Anis, MD 07/13/2018, 2:16 PM Pager: [phone]

## 2018-07-14 DIAGNOSIS — E43 Unspecified severe protein-calorie malnutrition: Secondary | ICD-10-CM

## 2018-07-14 LAB — GLUCOSE, CAPILLARY
GLUCOSE-CAPILLARY: 135 mg/dL — AB (ref 70–99)
Glucose-Capillary: 115 mg/dL — ABNORMAL HIGH (ref 70–99)
Glucose-Capillary: 107 mg/dL — ABNORMAL HIGH (ref 70–99)
Glucose-Capillary: 143 mg/dL — ABNORMAL HIGH (ref 70–99)
Glucose-Capillary: 168 mg/dL — ABNORMAL HIGH (ref 70–99)

## 2018-07-14 LAB — COMPREHENSIVE METABOLIC PANEL
ALT: 65 U/L — ABNORMAL HIGH (ref 0–44)
ANION GAP: 7 (ref 5–15)
AST: 48 U/L — ABNORMAL HIGH (ref 15–41)
Albumin: 2.2 g/dL — ABNORMAL LOW (ref 3.5–5.0)
Alkaline Phosphatase: 193 U/L — ABNORMAL HIGH (ref 38–126)
BILIRUBIN TOTAL: 0.7 mg/dL (ref 0.3–1.2)
BUN: 8 mg/dL (ref 8–23)
CO2: 29 mmol/L (ref 22–32)
Calcium: 8.5 mg/dL — ABNORMAL LOW (ref 8.9–10.3)
Chloride: 96 mmol/L — ABNORMAL LOW (ref 98–111)
Creatinine, Ser: 0.66 mg/dL (ref 0.61–1.24)
GFR calc Af Amer: >60 mL/min (ref >60)
Glucose, Bld: 105 mg/dL — ABNORMAL HIGH (ref 70–99)
Potassium: 3.5 mmol/L (ref 3.5–5.1)
Sodium: 132 mmol/L — ABNORMAL LOW (ref 135–145)
Total Protein: 6.2 g/dL — ABNORMAL LOW (ref 6.5–8.1)

## 2018-07-14 LAB — INSULIN, RANDOM: Insulin: 6.3 u[IU]/mL (ref 2.6–24.9)

## 2018-07-14 MED ORDER — DEXTROSE 50 % IV SOLN
1.0000 | INTRAVENOUS | Status: DC | PRN
  Administered 2018-07-15: 50 mL via INTRAVENOUS
  Filled 2018-07-14: qty 50

## 2018-07-14 MED ORDER — LEVOTHYROXINE SODIUM 25 MCG PO TABS
25.0000 ug | ORAL_TABLET | Freq: Every day | ORAL | Status: DC
  Filled 2018-07-14: qty 1

## 2018-07-14 NOTE — Clinical Social Work Note (Signed)
Clinical Social Work Assessment  Patient Details  Name: Jesus Ramsey MRN: [id number] Date of Birth: [birth year]  Date of referral:  07/14/18               Reason for consult:  Facility Placement, Discharge Planning                Permission sought to share information with:  Family Supports, Customer service manager Permission granted to share information::  No(pt confused)  Name::     Yvetta Coder and [employer]  Agency::  [employer]  Relationship::  brother and sister in Financial trader Information:  [phone]  Housing/Transportation Living arrangements for the past 2 months:  [place] of Information:  Siblings Patient Interpreter Needed:  None Criminal Activity/Legal Involvement Pertinent to Current Situation/Hospitalization:  No - Comment as needed Significant Relationships:  Other Family Members, Siblings Lives with:  Facility Resident Do you feel safe going back to the place where you live?  Yes Need for family participation in patient care:  Yes (Comment)  Care giving concerns:  Pt LTC resident at SNF, pt has had to have restraints during this hospitalization but has returned back to being more alert but still confused.    Social Worker assessment / plan:  CSW spoke with pt sister in law at number listed for brother and sister in Sports coach. Pt sister in law states that she was at hospital today visiting pt and feels he is doing well for baseline today. Pt sister in law states agreement with pt returning to LTC at [facility]. Pt sister in law informed that according to physician notes that pending medical stability pt will likely be discharged either 9/6 pr 9/7. Requested that staff call when pt cleared to return to SNF.   Employment status:  Retired Forensic scientist:  [place] [employer] PT Recommendations:  [place], [place] / Referral to community resources:  [place]  Patient/Family's  Response to care:  Pt sister in law states understanding of reason for call, understands and is in agreement with pt's return to SNF LTC at [facility].   Patient/Family's Understanding of and Emotional Response to Diagnosis, Current Treatment, and Prognosis:  Pt sister in law states understanding of pts diagnosis, current treatment and prognosis. Pt sister in law states reasonable expectations for pt continued care and desire for pt to return to SNF where he is a resident at discharge.   Emotional Assessment Appearance:  Appears stated age Attitude/Demeanor/Rapport:  Unable to Assess Affect (typically observed):  Unable to Assess Orientation:  Oriented to Self Alcohol / Substance use:  Not Applicable Psych involvement (Current and /or in the community):  No (Comment)  Discharge Needs  Concerns to be addressed:  Care Coordination Readmission within the last 30 days:  No Current discharge risk:  Cognitively Impaired, Dependent with Mobility, Physical Impairment Barriers to Discharge:  Continued Medical Work up   [employer], [place] 07/14/2018, 5:20 PM

## 2018-07-14 NOTE — Progress Notes (Signed)
Subjective:  Jesus Ramsey is a 84 y.o. with PMH of dysphagia, bph, htn admit for AMS on hospital day 4  Jesus Ramsey was examined and evaluated at bedside this a.m. He is AAO x1 to name.  He knows he is in [place] but does not know where. His speech is difficult to understand and intermittently coherent, but he is able to follow directions.  He is observed reaching for his lower extremities stating his legs are itchy. When his legs were exposed, he was observed scratching his leg vigorously with enough effort to cause bleeding. States his legs are itchy. Denies any fever pain nausea vomiting.  Objective:  Vital signs in last 24 hours: Vitals:   07/13/18 1551 07/13/18 1646 07/13/18 2057 07/14/18 0500  BP:  (!) 146/80 (!) 146/70 132/74  Pulse:  63 63 61  Resp:  16 18 18   Temp: 98.6 F (37 C) 98.6 F (37 C)    TempSrc: Rectal     SpO2:  100% 98% 99%  Weight:    73 kg  Height:       Physical Exam  Constitutional: No distress.  [place] appearing male sitting up in bed  HENT:  Mouth/Throat: Oropharyngeal exudate (Guttural speech) present.  Temporal wasting  Neck: Normal range of motion. Neck supple.  Cardiovascular: Normal rate, regular rhythm, normal heart sounds and intact distal pulses.  Pulmonary/Chest: Effort normal. Stridor: guttural noise when speaking. No respiratory distress. He has no wheezes. He has no rales.  Diminished breath sounds  Abdominal: Soft. Bowel sounds are normal. He exhibits no distension. There is no tenderness. There is no guarding.  Musculoskeletal: He exhibits edema (Trace pitting edema bilateral lower extremities. Left leg greater in size than right).  Neurological: He is alert. No cranial nerve deficit. GCS score is 15.  Oriented x1  Skin: Skin is dry. He is not diaphoretic.  Skin feels cool to touch. Discoloration and Crepey skin of lower extremities with scratch marks. SCD on right leg removed.    Assessment/Plan:  Principal Problem:  Hypothermia Active Problems:   Altered mental status   Severe sepsis with septic shock ([place])   Hypothyroidism   Community acquired pneumonia of left lower lobe of lung ([place])   Acute cystitis without hematuria   DNR (do not resuscitate) discussion   Acute metabolic encephalopathy   Aspiration pneumonia due to gastric secretions [facility])   Palliative care by specialist   Adult failure to thrive   Protein-calorie malnutrition, severe  Jesus Ramsey is a 84 yo M w/ PMH of BPH, Hypothyroidism, HTNadmit for hypothermia, hypoglycemia andaltered mental status. Mentation improved from ICU but not back to baseline. We will try to advance his diet with speech and swallow recommendations to improve his hypoglycemia and hypothermia. Not yet ready to be discharged to SNF, recommended by PT, considering his D5 requirement. No episode of hypoglycemia overnight.  Altered mental status 2/2hypothermia vs hypoglycemia Mentation not significantly changed from yesterday. Off soft restraints and out of ICU. Currently normoglycemic while on D10 125cc/hr. Bmp this morning show mild hyponatremia. Will decrease his Dextrose and allow him to normalize by diet as he is cleared by speech and swallow for oral intake with supervision. AM cortisol WNL (12.9) T4 WNL (8.1) Random insulin WNL 6.3 - C/w maintenance fluid:Dextrose 10% 75cc/hr - Will D/C Dextrose once patient is no longer having hypoglycemic episodes - Palliative on board for goals of care - Dysphagia 1 diet per speech and swallow - C/w PT - POC glucose checks  Lower extremity swelling 2/2 popliteal fossa cyst LLE ultrasound show no DVT but 4.04x1.71cm complex cyst - will monitor for now, if patient endorse pain or significant swelling, may need to tap  Diet: Dysphagia 1 diet DVT prophx: Lovenox Bowel: Milk of Magnesia, Senokot Code:Partial (pressors ok but no compressions, cardioversion, intubation)  Dispo: Anticipated discharge in approximately 2  day(s).   [name], MD 07/14/2018, 11:21 AM Pager: [phone]

## 2018-07-14 NOTE — Progress Notes (Signed)
Patient ID: Jesus Ramsey, male   DOB: Nov 18, 1930, 84 y.o.   MRN: [id number]  This NP visited patient at the bedside as a follow up to  yesterday's [place].   Patient is more alert today and feeding himself lunch.  Placed call to niece the  healthcare power of attorney [name] for continued conversation regarding diagnosis, prognosis, goals of care.  Family is very "thrilled" with his improvement.  They hope for continued improvement and then transition back to the SNF.    We discussed the patient's high risk for decompensation and the importance of ongoing goal setting depending on patient response to medical interventions over the next days to weeks and ongoing   Discussed with family the importance of continued conversation with the family and the  medical providers regarding overall plan of care and treatment options,  ensuring decisions are in the patient's best interest and within the  context of the patients values and GOCs.  Encouraged her to complete MOST with the providers at the SNF once family has defined GOCs, this niece lives out of town and plans to visit closer to the time of discharge.  Discussed a referral for palliative services in the skilled facility once he is discharged, they too can assist with completion of  MOST form and ongoing goal setting.  Questions and concerns addressed     Total time spent on the unit was 20 minutes  Greater than 50% of the time was spent in counseling and coordination of care  Wadie Lessen NP  Palliative Medicine Team Team Phone # [phone] Pager [phone]

## 2018-07-14 NOTE — Progress Notes (Signed)
  Speech Language Pathology Treatment: Dysphagia  Patient Details Name: Jesus Ramsey MRN: [id number] DOB: 23-Sep-1931 Today's Date: 07/14/2018 Time: 1950-9326 SLP Time Calculation (min) (ACUTE ONLY): 9 min  Assessment / Plan / Recommendation Clinical Impression  Pt consumed pureed solids and thin liquids with subtle wet vocal quality observed x1. Mod cues were needed to elicit a volitional throat clear to return voice to baseline. No further signs concerning for decreased airway protection were observed, although given pt's mentation, it is possible for his risk to fluctuate throughout the day. Would continue current diet with full supervision to facilitate awareness and attention throughout meals to increase safety.   HPI HPI: 84 yo M with history of MR presented with altered mental status.  Per the nursing home they felt that his speech has been different and that he has been less active than normal.  This been going on for the past 2 or 3 weeks. CXR shows possible left lower lobe PNA. Head CT negative for acute findings. Bedside swallow 01/14/12 with findings of normal swallow function, regular/thin liquids was recommended with no SLP follow-up.      SLP Plan  Continue with current plan of care       Recommendations  Diet recommendations: Dysphagia 1 (puree);Thin liquid Liquids provided via: Cup;Straw Medication Administration: Crushed with puree Supervision: Staff to assist with self feeding;Full supervision/cueing for compensatory strategies Compensations: Slow rate;Small sips/bites Postural Changes and/or Swallow Maneuvers: Seated upright 90 degrees;Upright 30-60 min after meal                Oral Care Recommendations: Oral care BID Follow up Recommendations: Skilled Nursing facility SLP Visit Diagnosis: Dysphagia, unspecified (R13.10) Plan: Continue with current plan of care       GO                Jesus Ramsey 07/14/2018, 8:40 AM  Jesus Ramsey, M.A.  [place] Acute Environmental education officer [phone] Office [phone]

## 2018-07-14 NOTE — Consult Note (Signed)
            [facility] CM Primary Care Navigator  07/14/2018  Jesus Ramsey [birth year] [id number]   Went to seepatient in the room to identify possible discharge needsbut he is resting in bed. No noted family at thebedside.   Per Inpatient social worker note, patient is a resident of [facility] skilled nursing facility. Primary care provider listed is Dr. [name] with [facility] in [place].  PerMD note, patient presented with an altered mental status and was admitted with working diagnosis of severe sepsis secondary to aspiration pneumonia of the left lower lobe.   Therapy recommends for patient todischarge back to SNF-skilled nursing facility.  Per MD note, palliative has been working with family to transition patient to comfort care.  Noted nofurtherhealth management needs identified atthis point.   For additional questions please contact:  Edwena Felty A. Shiloh Swopes, BSN, RN-BC [facility] PRIMARY CARE Navigator Cell: [phone]

## 2018-07-14 NOTE — Consult Note (Signed)
[place] Nurse wound consult note Reason for Consult: Chronic skin changes to bilateral lower legs with partial thickness scattered skin loss.  Wound type: venous changes  Dry skin and scattered nonintact skin loss.  Pressure Injury POA: NA Measurement: 0.2 cm scattered nonintact skin lesions, left >right Wound VAN:VBTY and moist Drainage (amount, consistency, odor) minimal serosanguinous  No odor Periwound:dry skin, generalized edema Dressing procedure/placement/frequency:  Cleanse bilateral lower legs with soap and water and pat dry.  Apply moisture barrier ointment daily.  Will not follow at this time.  Please re-consult if needed.  Domenic Moras RN BSN [place] Pager [phone]

## 2018-07-14 NOTE — Progress Notes (Signed)
Tele called with HR of 47 on patient. VS stable patient non symptomatic and resting. Paged Dr on call and made them aware. Unconcerning at this time

## 2018-07-14 NOTE — Progress Notes (Signed)
  Date: 07/14/2018  Patient name: Zyren Sevigny  Medical record number: [id number]  Date of birth: 26-Jun-1931   I have seen and evaluated this patient and I have discussed the plan of care with the house staff. Please see their note for complete details. I concur with their findings with the following additions/corrections: Mr Guggenheim was seen on morning rounds.  He remains alert and conversant although his speech is at times difficult to decipher.  He wanted chocolate ice cream and also complained of itching on his legs.  He was unable to uncover his legs which were covered by a blanket.  When we uncovered his legs to examine them, he did use his nails to scratch his lower extremities.  He was noted to have some serious seeping from his left lower extremity so we consulted wound care.  He remains on D 10 although Dr. Truman Hayward has decreased his weight.  Hopefully, he will be able to maintain euglycemia with oral intake so that we can stop the D10.  Once he is able to come off the D10, he will be able to be transferred back to the SNF.  We still do not know the etiology of his acute on chronic decompensation.  Bacterial infection has been ruled out.  Insulin levels are still pending for insulinoma I doubt nonconvulsive status epilepticus as he recovered without any intervention.  Anticipate transfer to SNF on the sixth or the seventh.  Bartholomew Crews, MD 07/14/2018, 2:49 PM

## 2018-07-15 DIAGNOSIS — N400 Enlarged prostate without lower urinary tract symptoms: Secondary | ICD-10-CM | POA: Diagnosis present

## 2018-07-15 DIAGNOSIS — J690 Pneumonitis due to inhalation of food and vomit: Secondary | ICD-10-CM | POA: Diagnosis not present

## 2018-07-15 DIAGNOSIS — G301 Alzheimer's disease with late onset: Secondary | ICD-10-CM | POA: Diagnosis not present

## 2018-07-15 DIAGNOSIS — Z960 Presence of urogenital implants: Secondary | ICD-10-CM | POA: Diagnosis not present

## 2018-07-15 DIAGNOSIS — M2550 Pain in unspecified joint: Secondary | ICD-10-CM | POA: Diagnosis not present

## 2018-07-15 DIAGNOSIS — I959 Hypotension, unspecified: Secondary | ICD-10-CM | POA: Diagnosis not present

## 2018-07-15 DIAGNOSIS — R2681 Unsteadiness on feet: Secondary | ICD-10-CM | POA: Diagnosis present

## 2018-07-15 DIAGNOSIS — E039 Hypothyroidism, unspecified: Secondary | ICD-10-CM | POA: Diagnosis present

## 2018-07-15 DIAGNOSIS — R1319 Other dysphagia: Secondary | ICD-10-CM | POA: Diagnosis not present

## 2018-07-15 DIAGNOSIS — R404 Transient alteration of awareness: Secondary | ICD-10-CM | POA: Diagnosis not present

## 2018-07-15 DIAGNOSIS — E43 Unspecified severe protein-calorie malnutrition: Secondary | ICD-10-CM | POA: Diagnosis present

## 2018-07-15 DIAGNOSIS — T68XXXA Hypothermia, initial encounter: Secondary | ICD-10-CM | POA: Diagnosis not present

## 2018-07-15 DIAGNOSIS — Z87891 Personal history of nicotine dependence: Secondary | ICD-10-CM | POA: Diagnosis not present

## 2018-07-15 DIAGNOSIS — D649 Anemia, unspecified: Secondary | ICD-10-CM | POA: Diagnosis not present

## 2018-07-15 DIAGNOSIS — Z8701 Personal history of pneumonia (recurrent): Secondary | ICD-10-CM | POA: Diagnosis not present

## 2018-07-15 DIAGNOSIS — R1312 Dysphagia, oropharyngeal phase: Secondary | ICD-10-CM | POA: Diagnosis present

## 2018-07-15 DIAGNOSIS — F0390 Unspecified dementia without behavioral disturbance: Secondary | ICD-10-CM | POA: Diagnosis present

## 2018-07-15 DIAGNOSIS — R40243 Glasgow coma scale score 3-8, unspecified time: Secondary | ICD-10-CM | POA: Diagnosis present

## 2018-07-15 DIAGNOSIS — I9589 Other hypotension: Secondary | ICD-10-CM | POA: Diagnosis present

## 2018-07-15 DIAGNOSIS — E162 Hypoglycemia, unspecified: Secondary | ICD-10-CM | POA: Diagnosis present

## 2018-07-15 DIAGNOSIS — E161 Other hypoglycemia: Secondary | ICD-10-CM | POA: Diagnosis not present

## 2018-07-15 DIAGNOSIS — G9341 Metabolic encephalopathy: Secondary | ICD-10-CM | POA: Diagnosis present

## 2018-07-15 DIAGNOSIS — R471 Dysarthria and anarthria: Secondary | ICD-10-CM | POA: Diagnosis not present

## 2018-07-15 DIAGNOSIS — Z66 Do not resuscitate: Secondary | ICD-10-CM | POA: Diagnosis present

## 2018-07-15 DIAGNOSIS — R627 Adult failure to thrive: Secondary | ICD-10-CM | POA: Diagnosis present

## 2018-07-15 DIAGNOSIS — J180 Bronchopneumonia, unspecified organism: Secondary | ICD-10-CM | POA: Diagnosis present

## 2018-07-15 DIAGNOSIS — I10 Essential (primary) hypertension: Secondary | ICD-10-CM | POA: Diagnosis present

## 2018-07-15 DIAGNOSIS — M6281 Muscle weakness (generalized): Secondary | ICD-10-CM | POA: Diagnosis present

## 2018-07-15 DIAGNOSIS — Z6821 Body mass index (BMI) 21.0-21.9, adult: Secondary | ICD-10-CM | POA: Diagnosis not present

## 2018-07-15 DIAGNOSIS — Z79899 Other long term (current) drug therapy: Secondary | ICD-10-CM | POA: Diagnosis not present

## 2018-07-15 DIAGNOSIS — Z515 Encounter for palliative care: Secondary | ICD-10-CM | POA: Diagnosis present

## 2018-07-15 DIAGNOSIS — E876 Hypokalemia: Secondary | ICD-10-CM | POA: Diagnosis present

## 2018-07-15 DIAGNOSIS — Z7982 Long term (current) use of aspirin: Secondary | ICD-10-CM | POA: Diagnosis not present

## 2018-07-15 DIAGNOSIS — J90 Pleural effusion, not elsewhere classified: Secondary | ICD-10-CM | POA: Diagnosis present

## 2018-07-15 DIAGNOSIS — R946 Abnormal results of thyroid function studies: Secondary | ICD-10-CM | POA: Diagnosis present

## 2018-07-15 DIAGNOSIS — J984 Other disorders of lung: Secondary | ICD-10-CM | POA: Diagnosis not present

## 2018-07-15 DIAGNOSIS — F79 Unspecified intellectual disabilities: Secondary | ICD-10-CM | POA: Diagnosis present

## 2018-07-15 DIAGNOSIS — R001 Bradycardia, unspecified: Secondary | ICD-10-CM | POA: Diagnosis present

## 2018-07-15 DIAGNOSIS — R1310 Dysphagia, unspecified: Secondary | ICD-10-CM | POA: Diagnosis present

## 2018-07-15 DIAGNOSIS — Y95 Nosocomial condition: Secondary | ICD-10-CM | POA: Diagnosis present

## 2018-07-15 DIAGNOSIS — J988 Other specified respiratory disorders: Secondary | ICD-10-CM | POA: Diagnosis not present

## 2018-07-15 DIAGNOSIS — F0280 Dementia in other diseases classified elsewhere without behavioral disturbance: Secondary | ICD-10-CM | POA: Diagnosis not present

## 2018-07-15 DIAGNOSIS — T68XXXS Hypothermia, sequela: Secondary | ICD-10-CM | POA: Diagnosis not present

## 2018-07-15 DIAGNOSIS — J181 Lobar pneumonia, unspecified organism: Secondary | ICD-10-CM | POA: Diagnosis not present

## 2018-07-15 DIAGNOSIS — R680 Hypothermia, not associated with low environmental temperature: Secondary | ICD-10-CM | POA: Diagnosis present

## 2018-07-15 DIAGNOSIS — R4189 Other symptoms and signs involving cognitive functions and awareness: Secondary | ICD-10-CM | POA: Diagnosis not present

## 2018-07-15 DIAGNOSIS — Z7189 Other specified counseling: Secondary | ICD-10-CM | POA: Diagnosis not present

## 2018-07-15 DIAGNOSIS — R279 Unspecified lack of coordination: Secondary | ICD-10-CM | POA: Diagnosis present

## 2018-07-15 DIAGNOSIS — R4182 Altered mental status, unspecified: Secondary | ICD-10-CM | POA: Diagnosis present

## 2018-07-15 DIAGNOSIS — Z79890 Hormone replacement therapy (postmenopausal): Secondary | ICD-10-CM | POA: Diagnosis not present

## 2018-07-15 DIAGNOSIS — R41841 Cognitive communication deficit: Secondary | ICD-10-CM | POA: Diagnosis present

## 2018-07-15 DIAGNOSIS — J918 Pleural effusion in other conditions classified elsewhere: Secondary | ICD-10-CM | POA: Diagnosis not present

## 2018-07-15 DIAGNOSIS — T68XXXD Hypothermia, subsequent encounter: Secondary | ICD-10-CM | POA: Diagnosis not present

## 2018-07-15 DIAGNOSIS — R531 Weakness: Secondary | ICD-10-CM | POA: Diagnosis not present

## 2018-07-15 DIAGNOSIS — Z7401 Bed confinement status: Secondary | ICD-10-CM | POA: Diagnosis not present

## 2018-07-15 LAB — CULTURE, BLOOD (ROUTINE X 2)
Culture: NO GROWTH
Culture: NO GROWTH
SPECIAL REQUESTS: ADEQUATE

## 2018-07-15 LAB — BASIC METABOLIC PANEL
Anion gap: 7 (ref 5–15)
BUN: 10 mg/dL (ref 8–23)
CO2: 31 mmol/L (ref 22–32)
Calcium: 8.8 mg/dL — ABNORMAL LOW (ref 8.9–10.3)
Chloride: 98 mmol/L (ref 98–111)
Creatinine, Ser: 0.63 mg/dL (ref 0.61–1.24)
GFR calc non Af Amer: >60 mL/min (ref >60)
GLUCOSE: 110 mg/dL — AB (ref 70–99)
Potassium: 3.7 mmol/L (ref 3.5–5.1)
Sodium: 136 mmol/L (ref 135–145)

## 2018-07-15 LAB — GLUCOSE, CAPILLARY
GLUCOSE-CAPILLARY: 127 mg/dL — AB (ref 70–99)
GLUCOSE-CAPILLARY: 68 mg/dL — AB (ref 70–99)
GLUCOSE-CAPILLARY: 164 mg/dL — AB (ref 70–99)
GLUCOSE-CAPILLARY: 75 mg/dL (ref 70–99)
Glucose-Capillary: 152 mg/dL — ABNORMAL HIGH (ref 70–99)
Glucose-Capillary: 93 mg/dL (ref 70–99)

## 2018-07-15 LAB — PHOSPHORUS: Phosphorus: 2.3 mg/dL — ABNORMAL LOW (ref 2.5–4.6)

## 2018-07-15 MED ORDER — POTASSIUM PHOSPHATE MONOBASIC 500 MG PO TABS
500.0000 mg | ORAL_TABLET | Freq: Once | ORAL | Status: DC
  Filled 2018-07-15: qty 1

## 2018-07-15 MED ORDER — SODIUM PHOSPHATES 45 MMOLE/15ML IV SOLN
10.0000 mmol | Freq: Once | INTRAVENOUS | Status: DC
  Administered 2018-07-15: 10 mmol via INTRAVENOUS
  Filled 2018-07-15: qty 3.33

## 2018-07-15 MED ORDER — K PHOS MONO-SOD PHOS DI & MONO 155-852-130 MG PO TABS
500.0000 mg | ORAL_TABLET | Freq: Once | ORAL | Status: AC
  Administered 2018-07-15: 500 mg via ORAL
  Filled 2018-07-15: qty 2

## 2018-07-15 NOTE — Progress Notes (Signed)
Clinical Social Worker facilitated patient discharge including contacting patient family and facility to confirm patient discharge plans.  Clinical information faxed to facility and family agreeable with plan.  CSW arranged ambulance transport via [facility] to [facility].  RN to call [phone] (pt will go in room 143A) for report prior to discharge.  Clinical Social Worker will sign off for now as social work intervention is no longer needed. Please consult [place] again if new need arises.  Rhea Pink, MSW, [place]

## 2018-07-15 NOTE — Progress Notes (Deleted)
Report received from Brian, RN

## 2018-07-15 NOTE — Progress Notes (Signed)
  Date: 07/15/2018  Patient name: Jesus Ramsey  Medical record number: [id number]  Date of birth: 1931-09-02   I have seen and evaluated this patient and I have discussed the plan of care with the house staff. Please see their note for complete details. I concur with their findings with the following additions/corrections: [name] was seen on morning rounds.  He was not responding to voice but would withdrawal his legs from pain.  We checked his CBG, O2 sat, blood pressure all of which were within normal range.  We attempted to check his temperature but was unable to get a reading.  Subsequently, his temperature was recorded as 98.1.  With more stimulation, he started making spontaneous movements of his extremities.  As Dr. Manya Silvas documented, he squeezed his eyes shut when she said she was going to look into them and also nodded.  When she returned later in the day, he was back to baseline.  We had not found an etiology for his hypotension, hypoglycemia, and hypothermia.  His current status does seem consistent with his status at the SNF over the past couple weeks but is a decline compared to where he was a month ago.  Again, we have not found any reversible causes that would allow [place] to return him to his baseline a month ago.  There is no further inpatient interventions needed and he is stable to be transferred back to his SNF.  Bartholomew Crews, MD 07/15/2018, 4:25 PM

## 2018-07-15 NOTE — Progress Notes (Signed)
Subjective: Jesus Ramsey was resting in bed this morning. He was not alert or awake when we saw him with the team. He did not follow commands and would not open his eyes. He did respond to painful stimulation and would pull away from the source. He did close his eyes tighter when I said I was going to look at his eyes. He also gave a small nod when I reported that I was going to come back later.   We got a CBG when we were in there and it was 93, his temperature was unable to be obtained due to equipment malfunction, but he was oxygenating at 100% and was normotensive. He was bradycardic at that time. We learned that he was somewhat like this yesterday morning but improved as the day went on.   When I returned later in the day patient was somewhat more interactive. He was still difficult to understand. He opened his eyes when I was talking to him and would follow commands like gripping my hands. The nurses reported that he had a large bowel movement and that he had become more alert after that.   Objective:  Vital signs in last 24 hours: Vitals:   07/13/18 2057 07/14/18 0500 07/14/18 1529 07/14/18 2019  BP: (!) 146/70 132/74 (!) 109/50 133/78  Pulse: 63 61 60 71  Resp: 18 18 18 18   Temp:      TempSrc:      SpO2: 98% 99% 99% 95%  Weight:  73 kg    Height:        General: Laying in bed, unresponsive, frail appearing Cardiac: RRR, normal S1, S2, no murmurs, rubs or gallops  Pulmonary: Lungs CTA bilaterally, no wheezing, rhonchi or rales  Abdomen: Soft, non-tender, +bowel sounds, no guarding or masses noted  Extremity: No LE edema, no muscle atrophy, no lesions or wounds noted  Psychiatry: Normal mood and affect     Assessment/Plan:  Principal Problem:   Hypothermia Active Problems:   Altered mental status   Severe sepsis with septic shock (HCC)   Hypothyroidism   Community acquired pneumonia of left lower lobe of lung ([place])   Acute cystitis without hematuria   DNR (do not  resuscitate) discussion   Acute metabolic encephalopathy   Aspiration pneumonia due to gastric secretions ([place])   Palliative care by specialist   Adult failure to thrive   Protein-calorie malnutrition, severe This is a 84 year old male with a PMH of BPH, hypothyroidism, and HTN who presented with hypothermia, hypoglycemia, and altered mental status who spent some time in the ICU. His mentation had been improving however this morning he was unresponsive and not following commands. His CBG and vitals were all WNL, no clear etiologies on labs were found. He was planned to go back to SNF when his D5 requirement was not needed. Patients hypoglycemia has been controlled overnight, did not need any D5 yesterday.   Altered mental status -Mentation is not well today, was not responding and not following commands this morning. His CBGs have been normal. Speech saw him yesterday and recommended a dysphagia diet, however patient has not been taking anything by mouth due to his altered mental status.  -AM cortisol 12.9 (WNL), T4 8.1 (WNL), and random insulin 6.3 (WNL), his CT was unreliable due to patient moving while getting it done however it did not show any obvious abnormalities. Urine and blood cultures have been negative.  -It's unclear what is causing the patient to experience this  altered mental status. He had been improving yesterday but seems to be worse this morning. His temperature has been difficult to obtain due to the equipment not registering it. An insulinoma has been ruled out, no signs of infection, has been normoglycemic, CT scan was normal, thyroid levels and cortisol levels were normal -He has been having this decline for a few weeks not, developing more slurred speech, incoherent sentences, loss of coordination, inability to concentrate while he was at [place] -Continue senokot and mild of magnesia  Hypothyroidism: -TSh was WNL at 8.1 Continue synthroid 20 mcg  FEN: No fluids,  replete lytes prn, dysphagia diet VTE ppx: Lovenox  Code Status: Partial (pressors okay, no compressions, cardioversion, or intubation)   Dispo: Anticipated discharge in approximately 0-1 day(s).   Asencion Noble, MD 07/15/2018, 6:26 AM Pager: [phone]

## 2018-07-15 NOTE — Progress Notes (Addendum)
Physical Therapy Treatment Patient Details Name: Jesus Ramsey MRN: [id number] DOB: [birth year] Today's Date: 07/15/2018    History of Present Illness 84 y.o. with PMH of BPh, Dysphagia, Developmental cognitive delays, and htn admit for AMS and Hypothermia    PT Comments    Pt sidelying in bed on arrival, minimally conversive and slow to respond to commands.  Pt performed transfers from various surface height with varrying levels of assistance from +1 to +2 assistance.  Pt continues to benefit from skilled placement at d/c.    Follow Up Recommendations  SNF     Equipment Recommendations  None recommended by PT    Recommendations for Other Services       Precautions / Restrictions Precautions Precautions: Fall Restrictions Weight Bearing Restrictions: No    Mobility  Bed Mobility Overal bed mobility: Needs Assistance       Supine to sit: Mod assist     General bed mobility comments: Pt in R sidelying on arrival and required moderate assistance to achieve sitting upright.  Pt with posterior lean requiring cues to right, also present with R lateral lean.  Pt able to respond to cues and once he was able to ground B feet his balance improved.    Transfers Overall transfer level: Needs assistance Equipment used: (sara stedy,) Transfers: Sit to/from Stand Sit to Stand: Mod assist;Max assist;+2 physical assistance(Pt's assistance level varried during session based on cognitive presentation and ability to follow commands.  mod+1 to max +2.  )         General transfer comment: Pt performed transfer from edge of bed to standing with sara stedy frame.  Pt is able to follow commands for hand placement and to stand upright.  Pt performed multiple reps of standing from stedy plates, BSC and edge of bed, due to bowel incontinence in standing.  Pt performed with various levels of assistance based on cognition and poor ability to achieve erect stance and presents with flexed hips which  prevents his stedy plates from sliding behind his bottom.       Ambulation/Gait Ambulation/Gait assistance: (NT)               Stairs             Wheelchair Mobility    Modified Rankin (Stroke Patients Only)       Balance Overall balance assessment: Needs assistance Sitting-balance support: Feet supported;Feet unsupported;Bilateral upper extremity supported Sitting balance-Leahy Scale: Fair       Standing balance-Leahy Scale: Fair                              Cognition Arousal/Alertness: Awake/alert Behavior During Therapy: Flat affect Overall Cognitive Status: No family/caregiver present to determine baseline cognitive functioning                                 General Comments: appears to be decrease from report from SNF       Exercises      General Comments        Pertinent Vitals/Pain Pain Assessment: Faces Faces Pain Scale: No hurt    Home Living                      Prior Function            PT Goals (current goals can now be found in  the care plan section) Acute Rehab PT Goals Patient Stated Goal: none stated Potential to Achieve Goals: Fair Progress towards PT goals: Progressing toward goals    Frequency    Min 2X/week      PT Plan Current plan remains appropriate    Co-evaluation              AM-PAC PT "6 Clicks" Daily Activity  Outcome Measure  Difficulty turning over in bed (including adjusting bedclothes, sheets and blankets)?: Unable Difficulty moving from lying on back to sitting on the side of the bed? : Unable Difficulty sitting down on and standing up from a chair with arms (e.g., wheelchair, bedside commode, etc,.)?: Unable Help needed moving to and from a bed to chair (including a wheelchair)?: Total Help needed walking in hospital room?: Total Help needed climbing 3-5 steps with a railing? : Total 6 Click Score: 6    End of Session Equipment Utilized During  Treatment: Gait belt Activity Tolerance: Patient tolerated treatment well Patient left: in bed;with call bell/phone within reach;with nursing/sitter in room Nurse Communication: Mobility status PT Visit Diagnosis: Unsteadiness on feet (R26.81);Other abnormalities of gait and mobility (R26.89);Muscle weakness (generalized) (M62.81);Difficulty in walking, not elsewhere classified (R26.2);Other symptoms and signs involving the nervous system (R29.898)     Time: 5465-0354 PT Time Calculation (min) (ACUTE ONLY): 31 min  Charges:  $Therapeutic Activity: 23-37 mins                     Governor Rooks, PTA Acute Rehabilitation Services Pager [phone] Office [phone]     Jesus Ramsey 07/15/2018, 2:10 PM

## 2018-07-15 NOTE — Progress Notes (Signed)
Spoke with MD Frederico Hamman in regard to filling out MOST form for patient but stated she could not complete form due to liability and that the attending MD would need to follow-up with form involving the family member also. MD Frederico Hamman stated that she will take the form and follow-up with the appropriate person to complete the process for initiating a partial code for patient. Will follow-up with nursing facility tomorrow. PTAR present also during conversation and made aware. Patient ready for discharge and transported to facility.

## 2018-07-15 NOTE — Progress Notes (Signed)
  Speech Language Pathology Treatment: Dysphagia  Patient Details Name: Jesus Ramsey MRN: [id number] DOB: 05-16-1931 Today's Date: 07/15/2018 Time: 1025-1040 SLP Time Calculation (min) (ACUTE ONLY): 15 min  Assessment / Plan / Recommendation Clinical Impression  RN reports pt has been lethargic and difficult to arouse. SLP facilitated alertness with repositioning, tactile stimulation (cool washcloth), and by attempting oral care, which pt refused. Pt opens eyes but does not respond verbally. SLP attempted assessment with thin liquids, ice chips. Pt purses lips around straw but does not generate negative pressure sufficient for bolus retrieval, despite max cues. With ice chip presented via teaspoon, pt does not make efforts to retrieve, and SLP ceased attempts for POs as pt lethargic, not responding to cues for re-arousal. As pt's mentation fluctuating throughout the day, he remains at increased risk for aspiration. Would continue dys 1 (puree) and thin liquids, meds crushed in puree. Pt must have full supervision and assistance, and feeding should only be attempted when pt is alert. Discussed with RN to hold trays/ medications when lethargic. Will continue to follow.     HPI HPI: 84 yo M with history of MR presented with altered mental status.  Per the nursing home they felt that his speech has been different and that he has been less active than normal.  This been going on for the past 2 or 3 weeks. CXR shows possible left lower lobe PNA. Head CT negative for acute findings. Bedside swallow 01/14/12 with findings of normal swallow function, regular/thin liquids was recommended with no SLP follow-up.      SLP Plan  Continue with current plan of care       Recommendations  Diet recommendations: Dysphagia 1 (puree);Thin liquid(Hold POs when lethargic) Liquids provided via: Cup;Straw Medication Administration: Crushed with puree Supervision: Staff to assist with self feeding;Full supervision/cueing  for compensatory strategies Compensations: Slow rate;Small sips/bites Postural Changes and/or Swallow Maneuvers: Seated upright 90 degrees;Upright 30-60 min after meal                Oral Care Recommendations: Oral care BID Follow up Recommendations: Skilled Nursing facility SLP Visit Diagnosis: Dysphagia, unspecified (R13.10) Plan: Continue with current plan of care       [place], [place], [place] Pathologist Acute Rehabilitation Services Pager: [phone] Office: [phone]   [name] 07/15/2018, 11:16 AM

## 2018-07-15 NOTE — Progress Notes (Signed)
PTAR present to transfer patient to nursing facility. MD called to fill out MOST form for patient to be transferred.

## 2018-07-15 NOTE — Discharge Summary (Signed)
Name: Jesus Ramsey MRN: [id number] DOB: 1931-05-10 84 y.o. PCP: [name], MD  Date of Admission: 07/10/2018  9:49 AM Date of Discharge:  07/15/2018 Attending Physician: Bartholomew Crews, MD  Discharge Diagnosis: 1. Hypoglycemia, hypothermia, hypotension  Discharge Medications: Allergies as of 07/15/2018   No Known Allergies     Medication List    TAKE these medications   acetaminophen 325 MG tablet Commonly known as:  TYLENOL Take 650 mg by mouth every 6 (six) hours as needed. pain   aspirin 81 MG chewable tablet Chew 81 mg by mouth daily.   atorvastatin 10 MG tablet Commonly known as:  LIPITOR Take 10 mg by mouth every evening.   hydrochlorothiazide 12.5 MG tablet Commonly known as:  HYDRODIURIL Take 12.5 mg by mouth every morning.   latanoprost 0.005 % ophthalmic solution Commonly known as:  XALATAN Place 1 drop into both eyes at bedtime.   levothyroxine 25 MCG tablet Commonly known as:  SYNTHROID, LEVOTHROID Take 25 mcg by mouth every morning.   magnesium hydroxide 400 MG/5ML suspension Commonly known as:  MILK OF MAGNESIA Take 30 mLs by mouth daily as needed. For constipation   Metoprolol Tartrate 75 MG Tabs Take 75 mg by mouth 2 (two) times daily. Hold if SBP is <110 or HR <60   sodium chloride 1 g tablet Take 1 g by mouth every morning.   tamsulosin 0.4 MG Caps capsule Commonly known as:  FLOMAX Take 0.4 mg by mouth daily.   timolol 0.5 % ophthalmic solution Commonly known as:  TIMOPTIC Place 1 drop into both eyes every morning.   UNABLE TO FIND Take 1 each by mouth See admin instructions. Med Name: Frozen magic cup by mouth twice daily (lunch and dinner)       Disposition and follow-up:   Jesus Ramsey was discharged from [facility] in [place] condition.  At the hospital follow up visit please address:  1.  Please check his blood sugar levels and his temperature.   2.  Labs / imaging needed at time of follow-up:  None  3.  Pending labs/ test needing follow-up: None  Follow-up Appointments: Contact information for after-discharge care    Destination    [facility] .   Service:  Skilled Nursing Contact information: [place] Hospital Course by problem list: 1. Hypoglycemia, hypothermia: Patient is a 84 year old male with a history of delayer cognitive development living in a SNF for the past several years who presented with a decreased interactiveness, with some speech changes for the past 2-3 weeks, found to be hypothermic, rectal temp of 89 degrees, and hypotensive. He spent time in ICU due to his severe septic shock thought to be aspiration PNA/HCAP, he was on levophed at that time, was able to be weaned off. Procalcitonin was negative. He completed 4 days of cefepime but this was stopped since no bacterial infection was found. CBG was found to be 69, was started on D5 amps PRN. Patients temperature and CBGs had improved and was WNL. CT was done which was difficult to assess due to motion but no overt findings were seen. There was concern for nonconvulsive status epilepticus but given his 2-3 week history and fluctuating mental status this was unlikely. AM cortisol 12.9 (WNL), T4 8.1 (WNL), and random insulin 6.3 (WNL). Urine and blood cultures have been negative. He  continued to have fluctuating mental status changes, that was worse in the morning and improved as the day went on. We were unable to identify a source of the symptoms however we did correct his blood glucose levels and his temperature. He also has had some hypotension but became normotensive on discharge date.   Palliative care was consulted while in the ICU and they discussed goals of care with the family, recommended outpatient palliative care to further discuss goals of care.  Discharge Vitals:   BP 110/61 (BP Location: Left Arm)   Pulse (!)  48   Temp 98.1 F (36.7 C) (Oral)   Resp 18   Ht 5\' 11"  (1.803 m)   Wt 73 kg   SpO2 100%   BMI 22.45 kg/m   Pertinent Labs, Studies, and Procedures:  CBC Latest Ref Rng & Units 07/13/2018 07/12/2018 07/11/2018  WBC 4.0 - 10.5 K/uL 4.6 4.2 5.0  Hemoglobin 13.0 - 17.0 g/dL 9.8(L) 10.0(L) 10.3(L)  Hematocrit 39.0 - 52.0 % 30.3(L) 31.1(L) 32.6(L)  Platelets 150 - 400 K/uL 124(L) 136(L) 143(L)   BMP Latest Ref Rng & Units 07/15/2018 07/14/2018 07/13/2018  Glucose 70 - 99 mg/dL 110(H) 105(H) 126(H)  BUN 8 - 23 mg/dL 10 8 8   Creatinine 0.61 - 1.24 mg/dL 0.63 0.66 0.78  Sodium 135 - 145 mmol/L 136 132(L) 138  Potassium 3.5 - 5.1 mmol/L 3.7 3.5 3.8  Chloride 98 - 111 mmol/L 98 96(L) 103  CO2 22 - 32 mmol/L 31 29 30   Calcium 8.9 - 10.3 mg/dL 8.8(L) 8.5(L) 8.4(L)   CT head without contrast IMPRESSION: Very suboptimal study in view of the patient's motion during multiple attempted scans. No obvious intracranial abnormality acutely, but this study is markedly limited. There are changes of atrophy  Chest X-ray IMPRESSION: Central venous catheter tip projects over the superior vena cava.  Focal consolidation left lower hemithorax concerning for pneumonia in the appropriate clinical setting. Followup PA and lateral chest X-ray is recommended in 3-4 weeks following trial of antibiotic therapy to ensure resolution and exclude underlying malignancy.  Probable right basilar atelectasis.   Discharge Instructions: Discharge Instructions    Call MD for:  difficulty breathing, headache or visual disturbances   Complete by:  As directed    Call MD for:  extreme fatigue   Complete by:  As directed    Call MD for:  hives   Complete by:  As directed    Call MD for:  persistant dizziness or light-headedness   Complete by:  As directed    Call MD for:  persistant nausea and vomiting   Complete by:  As directed    Call MD for:  redness, tenderness, or signs of infection (pain, swelling, redness,  odor or green/yellow discharge around incision site)   Complete by:  As directed    Call MD for:  severe uncontrolled pain   Complete by:  As directed    Call MD for:  temperature >100.4   Complete by:  As directed    Diet - low sodium heart healthy   Complete by:  As directed    Increase activity slowly   Complete by:  As directed       Signed: Asencion Noble, MD 07/15/2018, 3:00 PM   Pager: [phone]

## 2018-07-15 NOTE — Progress Notes (Signed)
Report called to Reynaldo Minium, RN at [facility].

## 2018-07-15 NOTE — Discharge Instructions (Signed)
Jesus Ramsey,   It has been a pleasure working with you and we are glad you're feeling better. You were hospitalized for hypoglycemia(low blood sugar), and hypothermia (low temperature).   We checked a lot of tests and they all came back negative. We corrected your blood sugar levels so please continue to monitor your sugars while you are at home to make sure you don't become hypoglycemic again. Unfortunately we did not find any reversible causes for your change in mental status.   Follow up with your primary care provider in 1-2 weeks  If your symptoms worsen or you develop new symptoms, please seek medical help whether it is your primary care provider or emergency department.  If you have any questions about this hospitalization please call [phone].

## 2018-07-15 NOTE — Progress Notes (Signed)
Physical Therapy Treatment Patient Details Name: Jesus Ramsey MRN: [id number] DOB: 1931-04-03 Today's Date: 07/15/2018    History of Present Illness 84 y.o. with PMH of BPh, Dysphagia, Developmental cognitive delays, and htn admit for AMS and Hypothermia    PT Comments    PTA returned to room to assist with back to bed transfer.  Pt increasingly difficult to place into stedy and remove stedy plates due to poor hip extension. Increased time needed to perform transfers.     Follow Up Recommendations  SNF     Equipment Recommendations  None recommended by PT    Recommendations for Other Services       Precautions / Restrictions Precautions Precautions: Fall Restrictions Weight Bearing Restrictions: No    Mobility  Bed Mobility Overal bed mobility: Needs Assistance Bed Mobility: Sit to Supine      Sit to supine: Max assist   General bed mobility comments: Pt required assistance of trunk and LE to return to supine.  Once in supine patient require total +2 two to boost in supine due to poor ability to follow commands for scooting.    Transfers Overall transfer level: Needs assistance Equipment used: (sara stedy) Transfers: Sit to/from Stand Sit to Stand: Mod assist;Max assist;+2 physical assistance         General transfer comment: Pt remains to require varried level of assistance with increased assistance to place and remove sara stedy plates due to poor hip extension and resistance to facilitation.    Ambulation/Gait Ambulation/Gait assistance: (NT)               Stairs             Wheelchair Mobility    Modified Rankin (Stroke Patients Only)       Balance Overall balance assessment: Needs assistance Sitting-balance support: Feet supported;Feet unsupported;Bilateral upper extremity supported Sitting balance-Leahy Scale: Fair       Standing balance-Leahy Scale: Poor                              Cognition Arousal/Alertness:  Awake/alert Behavior During Therapy: Flat affect Overall Cognitive Status: No family/caregiver present to determine baseline cognitive functioning                                 General Comments: appears to be decrease from report from SNF       Exercises      General Comments        Pertinent Vitals/Pain Pain Assessment: Faces Faces Pain Scale: No hurt    Home Living                      Prior Function            PT Goals (current goals can now be found in the care plan section) Acute Rehab PT Goals Patient Stated Goal: none stated Potential to Achieve Goals: Fair Progress towards PT goals: Progressing toward goals    Frequency    Min 2X/week      PT Plan Current plan remains appropriate    Co-evaluation              AM-PAC PT "6 Clicks" Daily Activity  Outcome Measure  Difficulty turning over in bed (including adjusting bedclothes, sheets and blankets)?: Unable Difficulty moving from lying on back to sitting on the side of the bed? :  Unable Difficulty sitting down on and standing up from a chair with arms (e.g., wheelchair, bedside commode, etc,.)?: Unable Help needed moving to and from a bed to chair (including a wheelchair)?: Total Help needed walking in hospital room?: Total Help needed climbing 3-5 steps with a railing? : Total 6 Click Score: 6    End of Session Equipment Utilized During Treatment: Gait belt Activity Tolerance: Patient tolerated treatment well Patient left: in bed;with call bell/phone within reach;with nursing/sitter in room Nurse Communication: Mobility status PT Visit Diagnosis: Unsteadiness on feet (R26.81);Other abnormalities of gait and mobility (R26.89);Muscle weakness (generalized) (M62.81);Difficulty in walking, not elsewhere classified (R26.2);Other symptoms and signs involving the nervous system (R29.898)     Time: 0355-9741 PT Time Calculation (min) (ACUTE ONLY): 19 min  Charges:   $Therapeutic Activity: 8-22 mins                     Governor Rooks, PTA Acute Rehabilitation Services Pager [phone] Office [phone]     Adrie Picking Eli Hose 07/15/2018, 4:44 PM

## 2018-07-18 ENCOUNTER — Encounter (HOSPITAL_COMMUNITY): Payer: Self-pay | Admitting: Emergency Medicine

## 2018-07-18 ENCOUNTER — Emergency Department (HOSPITAL_COMMUNITY): Payer: Medicare Other

## 2018-07-18 ENCOUNTER — Other Ambulatory Visit: Payer: Self-pay

## 2018-07-18 ENCOUNTER — Inpatient Hospital Stay (HOSPITAL_COMMUNITY)
Admission: EM | Admit: 2018-07-18 | Discharge: 2018-07-25 | DRG: 193 | Disposition: A | Discharge status: Skilled Nursing Facility | Payer: Medicare Other | Source: Skilled Nursing Facility | Attending: Internal Medicine | Admitting: Internal Medicine

## 2018-07-18 ENCOUNTER — Inpatient Hospital Stay (HOSPITAL_COMMUNITY): Payer: Medicare Other

## 2018-07-18 DIAGNOSIS — G9341 Metabolic encephalopathy: Secondary | ICD-10-CM | POA: Diagnosis present

## 2018-07-18 DIAGNOSIS — R680 Hypothermia, not associated with low environmental temperature: Secondary | ICD-10-CM | POA: Diagnosis present

## 2018-07-18 DIAGNOSIS — Z87891 Personal history of nicotine dependence: Secondary | ICD-10-CM

## 2018-07-18 DIAGNOSIS — N400 Enlarged prostate without lower urinary tract symptoms: Secondary | ICD-10-CM | POA: Diagnosis present

## 2018-07-18 DIAGNOSIS — Z66 Do not resuscitate: Secondary | ICD-10-CM | POA: Diagnosis present

## 2018-07-18 DIAGNOSIS — F0390 Unspecified dementia without behavioral disturbance: Secondary | ICD-10-CM | POA: Diagnosis present

## 2018-07-18 DIAGNOSIS — M6281 Muscle weakness (generalized): Secondary | ICD-10-CM | POA: Diagnosis present

## 2018-07-18 DIAGNOSIS — E43 Unspecified severe protein-calorie malnutrition: Secondary | ICD-10-CM | POA: Diagnosis present

## 2018-07-18 DIAGNOSIS — I959 Hypotension, unspecified: Secondary | ICD-10-CM | POA: Diagnosis not present

## 2018-07-18 DIAGNOSIS — R404 Transient alteration of awareness: Secondary | ICD-10-CM | POA: Diagnosis not present

## 2018-07-18 DIAGNOSIS — J988 Other specified respiratory disorders: Secondary | ICD-10-CM | POA: Diagnosis not present

## 2018-07-18 DIAGNOSIS — J180 Bronchopneumonia, unspecified organism: Secondary | ICD-10-CM | POA: Diagnosis not present

## 2018-07-18 DIAGNOSIS — J690 Pneumonitis due to inhalation of food and vomit: Secondary | ICD-10-CM | POA: Diagnosis not present

## 2018-07-18 DIAGNOSIS — Z7189 Other specified counseling: Secondary | ICD-10-CM | POA: Diagnosis not present

## 2018-07-18 DIAGNOSIS — J984 Other disorders of lung: Secondary | ICD-10-CM | POA: Diagnosis not present

## 2018-07-18 DIAGNOSIS — I10 Essential (primary) hypertension: Secondary | ICD-10-CM | POA: Diagnosis present

## 2018-07-18 DIAGNOSIS — Z79890 Hormone replacement therapy (postmenopausal): Secondary | ICD-10-CM | POA: Diagnosis not present

## 2018-07-18 DIAGNOSIS — R40243 Glasgow coma scale score 3-8, unspecified time: Secondary | ICD-10-CM | POA: Diagnosis present

## 2018-07-18 DIAGNOSIS — Z515 Encounter for palliative care: Secondary | ICD-10-CM | POA: Diagnosis present

## 2018-07-18 DIAGNOSIS — R627 Adult failure to thrive: Secondary | ICD-10-CM | POA: Diagnosis present

## 2018-07-18 DIAGNOSIS — Y95 Nosocomial condition: Secondary | ICD-10-CM | POA: Diagnosis present

## 2018-07-18 DIAGNOSIS — R279 Unspecified lack of coordination: Secondary | ICD-10-CM | POA: Diagnosis present

## 2018-07-18 DIAGNOSIS — Z6821 Body mass index (BMI) 21.0-21.9, adult: Secondary | ICD-10-CM

## 2018-07-18 DIAGNOSIS — T68XXXA Hypothermia, initial encounter: Secondary | ICD-10-CM | POA: Diagnosis not present

## 2018-07-18 DIAGNOSIS — F0280 Dementia in other diseases classified elsewhere without behavioral disturbance: Secondary | ICD-10-CM | POA: Diagnosis not present

## 2018-07-18 DIAGNOSIS — R1310 Dysphagia, unspecified: Secondary | ICD-10-CM

## 2018-07-18 DIAGNOSIS — Z7401 Bed confinement status: Secondary | ICD-10-CM | POA: Diagnosis not present

## 2018-07-18 DIAGNOSIS — E161 Other hypoglycemia: Secondary | ICD-10-CM | POA: Diagnosis not present

## 2018-07-18 DIAGNOSIS — R0902 Hypoxemia: Secondary | ICD-10-CM | POA: Diagnosis not present

## 2018-07-18 DIAGNOSIS — E039 Hypothyroidism, unspecified: Secondary | ICD-10-CM | POA: Diagnosis present

## 2018-07-18 DIAGNOSIS — J181 Lobar pneumonia, unspecified organism: Secondary | ICD-10-CM | POA: Diagnosis not present

## 2018-07-18 DIAGNOSIS — Z7982 Long term (current) use of aspirin: Secondary | ICD-10-CM

## 2018-07-18 DIAGNOSIS — J918 Pleural effusion in other conditions classified elsewhere: Secondary | ICD-10-CM | POA: Diagnosis not present

## 2018-07-18 DIAGNOSIS — E162 Hypoglycemia, unspecified: Secondary | ICD-10-CM | POA: Diagnosis present

## 2018-07-18 DIAGNOSIS — R946 Abnormal results of thyroid function studies: Secondary | ICD-10-CM | POA: Diagnosis present

## 2018-07-18 DIAGNOSIS — R4182 Altered mental status, unspecified: Secondary | ICD-10-CM | POA: Diagnosis not present

## 2018-07-18 DIAGNOSIS — Z8701 Personal history of pneumonia (recurrent): Secondary | ICD-10-CM | POA: Diagnosis not present

## 2018-07-18 DIAGNOSIS — R471 Dysarthria and anarthria: Secondary | ICD-10-CM | POA: Diagnosis not present

## 2018-07-18 DIAGNOSIS — E876 Hypokalemia: Secondary | ICD-10-CM | POA: Diagnosis not present

## 2018-07-18 DIAGNOSIS — I9589 Other hypotension: Secondary | ICD-10-CM | POA: Diagnosis not present

## 2018-07-18 DIAGNOSIS — R1319 Other dysphagia: Secondary | ICD-10-CM | POA: Diagnosis not present

## 2018-07-18 DIAGNOSIS — F79 Unspecified intellectual disabilities: Secondary | ICD-10-CM | POA: Diagnosis present

## 2018-07-18 DIAGNOSIS — G301 Alzheimer's disease with late onset: Secondary | ICD-10-CM | POA: Diagnosis not present

## 2018-07-18 DIAGNOSIS — R001 Bradycardia, unspecified: Secondary | ICD-10-CM | POA: Diagnosis not present

## 2018-07-18 DIAGNOSIS — R1312 Dysphagia, oropharyngeal phase: Secondary | ICD-10-CM | POA: Diagnosis present

## 2018-07-18 DIAGNOSIS — T68XXXS Hypothermia, sequela: Secondary | ICD-10-CM | POA: Diagnosis not present

## 2018-07-18 DIAGNOSIS — T68XXXD Hypothermia, subsequent encounter: Secondary | ICD-10-CM | POA: Diagnosis not present

## 2018-07-18 DIAGNOSIS — J90 Pleural effusion, not elsewhere classified: Secondary | ICD-10-CM | POA: Diagnosis present

## 2018-07-18 DIAGNOSIS — M2550 Pain in unspecified joint: Secondary | ICD-10-CM | POA: Diagnosis not present

## 2018-07-18 DIAGNOSIS — Z960 Presence of urogenital implants: Secondary | ICD-10-CM | POA: Diagnosis not present

## 2018-07-18 DIAGNOSIS — R531 Weakness: Secondary | ICD-10-CM

## 2018-07-18 DIAGNOSIS — Z79899 Other long term (current) drug therapy: Secondary | ICD-10-CM | POA: Diagnosis not present

## 2018-07-18 DIAGNOSIS — D649 Anemia, unspecified: Secondary | ICD-10-CM | POA: Diagnosis not present

## 2018-07-18 LAB — COMPREHENSIVE METABOLIC PANEL
ALK PHOS: 206 U/L — AB (ref 38–126)
ALT: 69 U/L — AB (ref 0–44)
ANION GAP: 7 (ref 5–15)
AST: 65 U/L — ABNORMAL HIGH (ref 15–41)
Albumin: 2.4 g/dL — ABNORMAL LOW (ref 3.5–5.0)
BUN: 7 mg/dL — AB (ref 8–23)
CHLORIDE: 101 mmol/L (ref 98–111)
CO2: 32 mmol/L (ref 22–32)
Calcium: 9.1 mg/dL (ref 8.9–10.3)
Creatinine, Ser: 0.61 mg/dL (ref 0.61–1.24)
GLUCOSE: 72 mg/dL (ref 70–99)
Potassium: 4.2 mmol/L (ref 3.5–5.1)
SODIUM: 140 mmol/L (ref 135–145)
TOTAL PROTEIN: 6.8 g/dL (ref 6.5–8.1)
Total Bilirubin: 0.4 mg/dL (ref 0.3–1.2)

## 2018-07-18 LAB — I-STAT CHEM 8, ED
BUN: 8 mg/dL (ref 8–23)
CHLORIDE: 100 mmol/L (ref 98–111)
CREATININE: 0.6 mg/dL — AB (ref 0.61–1.24)
Calcium, Ion: 1.12 mmol/L — ABNORMAL LOW (ref 1.15–1.40)
Glucose, Bld: 66 mg/dL — ABNORMAL LOW (ref 70–99)
HEMATOCRIT: 37 % — AB (ref 39.0–52.0)
Hemoglobin: 12.6 g/dL — ABNORMAL LOW (ref 13.0–17.0)
POTASSIUM: 4.4 mmol/L (ref 3.5–5.1)
Sodium: 138 mmol/L (ref 135–145)
TCO2: 33 mmol/L — ABNORMAL HIGH (ref 22–32)

## 2018-07-18 LAB — URINALYSIS, ROUTINE W REFLEX MICROSCOPIC
Bilirubin Urine: NEGATIVE
Glucose, UA: NEGATIVE mg/dL
Hgb urine dipstick: NEGATIVE
Ketones, ur: NEGATIVE mg/dL
LEUKOCYTES UA: NEGATIVE
NITRITE: NEGATIVE
Protein, ur: NEGATIVE mg/dL
Specific Gravity, Urine: 1.015 (ref 1.005–1.030)
pH: 7 (ref 5.0–8.0)

## 2018-07-18 LAB — CBC WITH DIFFERENTIAL/PLATELET
ABS IMMATURE GRANULOCYTES: 0 10*3/uL (ref 0.0–0.1)
BASOS ABS: 0 10*3/uL (ref 0.0–0.1)
Basophils Relative: 0 %
Eosinophils Absolute: 0.3 10*3/uL (ref 0.0–0.7)
Eosinophils Relative: 6 %
HCT: 38.2 % — ABNORMAL LOW (ref 39.0–52.0)
Hemoglobin: 12.1 g/dL — ABNORMAL LOW (ref 13.0–17.0)
Immature Granulocytes: 1 %
LYMPHS PCT: 22 %
Lymphs Abs: 1.1 10*3/uL (ref 0.7–4.0)
MCH: 28.8 pg (ref 26.0–34.0)
MCHC: 31.7 g/dL (ref 30.0–36.0)
MCV: 91 fL (ref 78.0–100.0)
Monocytes Absolute: 0.2 10*3/uL (ref 0.1–1.0)
Monocytes Relative: 5 %
NEUTROS ABS: 3.1 10*3/uL (ref 1.7–7.7)
NEUTROS PCT: 66 %
PLATELETS: 111 10*3/uL — AB (ref 150–400)
RBC: 4.2 MIL/uL — AB (ref 4.22–5.81)
RDW: 14.1 % (ref 11.5–15.5)
WBC: 4.8 10*3/uL (ref 4.0–10.5)

## 2018-07-18 LAB — CBG MONITORING, ED
GLUCOSE-CAPILLARY: 84 mg/dL (ref 70–99)
GLUCOSE-CAPILLARY: 48 mg/dL — AB (ref 70–99)
Glucose-Capillary: 181 mg/dL — ABNORMAL HIGH (ref 70–99)

## 2018-07-18 LAB — I-STAT TROPONIN, ED: Troponin i, poc: 0.03 ng/mL (ref 0.00–0.08)

## 2018-07-18 LAB — I-STAT CG4 LACTIC ACID, ED: Lactic Acid, Venous: 1.19 mmol/L (ref 0.5–1.9)

## 2018-07-18 LAB — MRSA PCR SCREENING: MRSA by PCR: NEGATIVE

## 2018-07-18 LAB — GLUCOSE, CAPILLARY
Glucose-Capillary: 77 mg/dL (ref 70–99)
Glucose-Capillary: 132 mg/dL — ABNORMAL HIGH (ref 70–99)
Glucose-Capillary: 79 mg/dL (ref 70–99)

## 2018-07-18 LAB — BASIC METABOLIC PANEL
Anion gap: 6 (ref 5–15)
BUN: 7 mg/dL — AB (ref 8–23)
CALCIUM: 8.3 mg/dL — AB (ref 8.9–10.3)
CHLORIDE: 102 mmol/L (ref 98–111)
CO2: 31 mmol/L (ref 22–32)
Creatinine, Ser: 0.65 mg/dL (ref 0.61–1.24)
GFR calc Af Amer: >60 mL/min (ref >60)
GFR calc non Af Amer: >60 mL/min (ref >60)
Glucose, Bld: 63 mg/dL — ABNORMAL LOW (ref 70–99)
Potassium: 3.8 mmol/L (ref 3.5–5.1)
SODIUM: 139 mmol/L (ref 135–145)

## 2018-07-18 LAB — TSH: TSH: 5.714 u[IU]/mL — ABNORMAL HIGH (ref 0.350–4.500)

## 2018-07-18 LAB — T4, FREE: Free T4: 0.93 ng/dL (ref 0.82–1.77)

## 2018-07-18 MED ORDER — ONDANSETRON HCL 4 MG PO TABS: 4.0000 mg | ORAL_TABLET | Freq: Four times a day (QID) | ORAL | Status: DC | PRN

## 2018-07-18 MED ORDER — SODIUM CHLORIDE 0.9 % IV SOLN
250.0000 mL | INTRAVENOUS | Status: DC | PRN
  Administered 2018-07-22: 250 mL via INTRAVENOUS

## 2018-07-18 MED ORDER — ACETAMINOPHEN 325 MG PO TABS: 650.0000 mg | ORAL_TABLET | Freq: Four times a day (QID) | ORAL | Status: DC | PRN

## 2018-07-18 MED ORDER — ONDANSETRON HCL 4 MG/2ML IJ SOLN: 4.0000 mg | Freq: Four times a day (QID) | INTRAMUSCULAR | Status: DC | PRN

## 2018-07-18 MED ORDER — LACTATED RINGERS IV BOLUS
1000.0000 mL | Freq: Once | INTRAVENOUS | Status: AC
  Administered 2018-07-18: 1000 mL via INTRAVENOUS

## 2018-07-18 MED ORDER — DEXTROSE 10 % IV SOLN
INTRAVENOUS | Status: DC
  Administered 2018-07-18: 22:00:00 via INTRAVENOUS
  Administered 2018-07-19: 130 mL/h via INTRAVENOUS
  Administered 2018-07-19 – 2018-07-24 (×14): via INTRAVENOUS

## 2018-07-18 MED ORDER — SODIUM CHLORIDE 0.9 % IV SOLN
2.0000 g | Freq: Two times a day (BID) | INTRAVENOUS | Status: DC
  Administered 2018-07-18 – 2018-07-20 (×4): 2 g via INTRAVENOUS
  Filled 2018-07-18 (×5): qty 2

## 2018-07-18 MED ORDER — ENOXAPARIN SODIUM 40 MG/0.4ML ~~LOC~~ SOLN
40.0000 mg | SUBCUTANEOUS | Status: DC
  Administered 2018-07-18 – 2018-07-23 (×6): 40 mg via SUBCUTANEOUS
  Filled 2018-07-18 (×6): qty 0.4

## 2018-07-18 MED ORDER — ACETAMINOPHEN 650 MG RE SUPP: 650.0000 mg | Freq: Four times a day (QID) | RECTAL | Status: DC | PRN

## 2018-07-18 MED ORDER — VANCOMYCIN HCL IN DEXTROSE 1-5 GM/200ML-% IV SOLN
1000.0000 mg | Freq: Once | INTRAVENOUS | Status: AC
  Administered 2018-07-18: 1000 mg via INTRAVENOUS
  Filled 2018-07-18: qty 200

## 2018-07-18 MED ORDER — SODIUM CHLORIDE 0.9 % IV BOLUS
1000.0000 mL | Freq: Once | INTRAVENOUS | Status: AC
  Administered 2018-07-18: 1000 mL via INTRAVENOUS

## 2018-07-18 MED ORDER — METRONIDAZOLE IN NACL 5-0.79 MG/ML-% IV SOLN
500.0000 mg | Freq: Three times a day (TID) | INTRAVENOUS | Status: DC
  Administered 2018-07-18 – 2018-07-20 (×7): 500 mg via INTRAVENOUS
  Filled 2018-07-18 (×8): qty 100

## 2018-07-18 MED ORDER — SODIUM CHLORIDE 0.9% FLUSH
3.0000 mL | Freq: Two times a day (BID) | INTRAVENOUS | Status: DC
  Administered 2018-07-18 – 2018-07-24 (×8): 3 mL via INTRAVENOUS

## 2018-07-18 MED ORDER — DEXTROSE 50 % IV SOLN
INTRAVENOUS | Status: AC
  Administered 2018-07-18: 50 mL via INTRAVENOUS
  Filled 2018-07-18: qty 50

## 2018-07-18 MED ORDER — VANCOMYCIN HCL IN DEXTROSE 1-5 GM/200ML-% IV SOLN
1000.0000 mg | Freq: Two times a day (BID) | INTRAVENOUS | Status: DC
  Administered 2018-07-19: 1000 mg via INTRAVENOUS
  Filled 2018-07-18 (×2): qty 200

## 2018-07-18 MED ORDER — DEXTROSE 50 % IV SOLN
INTRAVENOUS | Status: AC
  Administered 2018-07-18: 1 via INTRAVENOUS
  Filled 2018-07-18: qty 50

## 2018-07-18 MED ORDER — DEXTROSE 50 % IV SOLN
1.0000 | Freq: Once | INTRAVENOUS | Status: AC
  Administered 2018-07-18: 50 mL via INTRAVENOUS
  Administered 2018-07-18: 1 via INTRAVENOUS
  Administered 2018-07-19: 50 mL via INTRAVENOUS

## 2018-07-18 MED ORDER — SODIUM CHLORIDE 0.9% FLUSH
3.0000 mL | INTRAVENOUS | Status: DC | PRN
  Administered 2018-07-20: 3 mL via INTRAVENOUS
  Filled 2018-07-18: qty 3

## 2018-07-18 MED ORDER — SODIUM CHLORIDE 0.9 % IV SOLN
2.0000 g | Freq: Once | INTRAVENOUS | Status: AC
  Administered 2018-07-18: 2 g via INTRAVENOUS
  Filled 2018-07-18: qty 2

## 2018-07-18 MED ORDER — DEXTROSE-NACL 5-0.9 % IV SOLN
INTRAVENOUS | Status: DC
  Administered 2018-07-18: 16:00:00 via INTRAVENOUS

## 2018-07-18 NOTE — Progress Notes (Signed)
Pharmacy Antibiotic Note  Jesus Ramsey is a 84 y.o. male admitted on 07/18/2018 with sepsis.  Plan: Cefepime 2 g q12h Vanc 1 g q12h Flagyl per MD Monitor renal fx cx vt prn     Temp (24hrs), Avg:86.5 F (30.3 C), Min:86.5 F (30.3 C), Max:86.5 F (30.3 C)  Recent Labs  Lab 07/12/18 0338 07/13/18 0345 07/14/18 0512 07/15/18 0409 07/18/18 0955 07/18/18 1031 07/18/18 1032  WBC 4.2 4.6  --   --  4.8  --   --   CREATININE 0.90 0.78 0.66 0.63 0.61 0.60*  --   LATICACIDVEN  --   --   --   --   --   --  1.19    Estimated Creatinine Clearance: 69.7 mL/min (A) (by C-G formula based on SCr of 0.6 mg/dL (L)).    No Known Allergies  Levester Fresh, PharmD, BCPS, BCCCP Clinical Pharmacist [phone]  Please check AMION for all [place] numbers  07/18/2018 11:04 AM

## 2018-07-18 NOTE — ED Provider Notes (Signed)
[facility] EMERGENCY DEPARTMENT Provider Note   CSN: [id number] Arrival date & time: 07/18/18  5366     History   Chief Complaint Chief Complaint  Patient presents with  . Cold Exposure  . Bradycardia    HPI Jesus Ramsey is a 84 y.o. male w/ h/o hypothyroidism, HTN, cognitive deficits is here for evaluation of persistent hypothermia, bradycardia and confusion since discharge from hospital yesterday.  Pt is able to tell me his name.  Per chart pt has had decline in mentation for several weeks.  He denies being in any pain. Level 5 caveat applies given baseline cognitive deficits. Rectal temp 86.36F and HR 40s on arrival.  Chart review shows pt dc from hospital yesterday. He was admitted 9/1-9/8 for hypothermia, bradycardia thought to be from aspiration PNA.  He was in ICU. Blood and urine cultures negative. Brief episode of hypoglcyemia requiring D5 PRN.  Temperature, CBG normalized and he was dc without identifiable source.  Mental status was fluctuating worst in the AM, improving in evening. Palliative care consulted recommended outpatient palliative care.   HPI  Past Medical History:  Diagnosis Date  . Altered mental status   . B12 deficiency 01/14/2012  . B12 deficiency anemia   . BPH (benign prostatic hypertrophy)   . Cataract   . Chronic hyponatremia    [name] 01/13/2012  . Dysphagia   . Hypertension   . Mental retardation   . Pneumonia   . Psychogenic polydipsia   . Seizures [facility])     Patient Active Problem List   Diagnosis Date Noted  . Protein-calorie malnutrition, severe 07/14/2018  . Adult failure to thrive   . Acute metabolic encephalopathy   . Aspiration pneumonia due to gastric secretions ([place])   . Palliative care by specialist   . Altered mental status 07/10/2018  . Hypothermia 07/10/2018  . Severe sepsis with septic shock ([place])   . Hypothyroidism   . Community acquired pneumonia of left lower lobe of lung ([place])   . Acute cystitis without  hematuria   . DNR (do not resuscitate) discussion   . Dysphagia, pharyngoesophageal phase 04/08/2015  . Seizures ([place]) 08/10/2014  . Essential hypertension 08/10/2014  . Absolute anemia 08/10/2014  . Psychogenic polydipsia 01/14/2012  . BPH (benign prostatic hyperplasia) 01/14/2012  . Mental retardation 01/13/2012  . Hyponatremia 01/13/2012    Past Surgical History:  Procedure Laterality Date  . NO PAST SURGERIES          Home Medications    Prior to Admission medications   Medication Sig Start Date End Date Taking? Authorizing Provider  acetaminophen (TYLENOL) 325 MG tablet Take 650 mg by mouth every 6 (six) hours as needed. pain    [provider]  aspirin 81 MG chewable tablet Chew 81 mg by mouth daily.    [provider]  atorvastatin (LIPITOR) 10 MG tablet Take 10 mg by mouth every evening. 07/08/18   [provider]  hydrochlorothiazide (HYDRODIURIL) 12.5 MG tablet Take 12.5 mg by mouth every morning. 06/29/18   [provider]  latanoprost (XALATAN) 0.005 % ophthalmic solution Place 1 drop into both eyes at bedtime.    [provider]  levothyroxine (SYNTHROID, LEVOTHROID) 25 MCG tablet Take 25 mcg by mouth every morning. 07/06/18   [provider]  magnesium hydroxide (MILK OF MAGNESIA) 400 MG/5ML suspension Take 30 mLs by mouth daily as needed. For constipation    [provider]  Metoprolol Tartrate 75 MG TABS Take 75  mg by mouth 2 (two) times daily. Hold if SBP is <110 or HR <60    [provider]  sodium chloride 1 G tablet Take 1 g by mouth every morning.    [provider]  Tamsulosin HCl (FLOMAX) 0.4 MG CAPS Take 0.4 mg by mouth daily.     [provider]  timolol (TIMOPTIC) 0.5 % ophthalmic solution Place 1 drop into both eyes every morning. 05/23/18   [provider]  UNABLE TO FIND Take 1 each by mouth See admin instructions. Med Name: Frozen magic cup by mouth twice  daily (lunch and dinner)    [provider]    Family History Family History  Family history unknown: Yes    Social History Social History   Tobacco Use  . Smoking status: Former Research scientist (life sciences)  . Smokeless tobacco: Never Used  Substance Use Topics  . Alcohol use: No  . Drug use: No     Allergies   Patient has no known allergies.   Review of Systems Review of Systems  Unable to perform ROS: Mental status change  Constitutional:       Hypothermia   Psychiatric/Behavioral: Positive for confusion.  All other systems reviewed and are negative.    Physical Exam Updated Vital Signs BP 118/65   Pulse (!) 55   Temp (!) 87.9 F (31.1 C) (Rectal)   Resp (!) 23   SpO2 100%   Physical Exam  Constitutional: He appears well-developed. He appears lethargic. He appears cachectic.  Chronically ill appearing. Temporal wasting. Keeps eyes closed.   HENT:  Head: Normocephalic and atraumatic.  Right Ear: External ear normal.  Left Ear: External ear normal.  Nose: Nose normal.  Dry lips but MMM. Poor dentition.   Eyes: Conjunctivae and EOM are normal.  Neck: Normal range of motion. Neck supple.  Cardiovascular: Normal rate, regular rhythm, normal heart sounds and intact distal pulses.  1+ femoral and radial pulses. Thready DP pulses. Hands and feet cold.   Pulmonary/Chest: Effort normal. He has decreased breath sounds.  Decreased breath sounds to lower lobes. No respiratory distress.   Abdominal: Soft. There is no tenderness.  Musculoskeletal: Normal range of motion. He exhibits no deformity.  Neurological: He appears lethargic.  Oriented to name, slurs his name. Cannot answer further questions. PERRL bilaterally intact. Attempts to hand grip bilaterally. Can follow simple commands (open mouth, eyes and squeeze hands)  Skin: Skin is warm and dry. Capillary refill takes less than 2 seconds.  Psychiatric: He has a normal mood and affect. His behavior is normal. Judgment and  thought content normal.  Nursing note and vitals reviewed.    ED Treatments / Results  Labs (all labs ordered are listed, but only abnormal results are displayed) Labs Reviewed  CBC WITH DIFFERENTIAL/PLATELET - Abnormal; Notable for the following components:      Result Value   RBC 4.20 (*)    Hemoglobin 12.1 (*)    HCT 38.2 (*)    Platelets 111 (*)    All other components within normal limits  COMPREHENSIVE METABOLIC PANEL - Abnormal; Notable for the following components:   BUN 7 (*)    Albumin 2.4 (*)    AST 65 (*)    ALT 69 (*)    Alkaline Phosphatase 206 (*)    All other components within normal limits  TSH - Abnormal; Notable for the following components:   TSH 5.714 (*)    All other components within normal limits  I-STAT  CHEM 8, ED - Abnormal; Notable for the following components:   Creatinine, Ser 0.60 (*)    Glucose, Bld 66 (*)    Calcium, Ion 1.12 (*)    TCO2 33 (*)    Hemoglobin 12.6 (*)    HCT 37.0 (*)    All other components within normal limits  CULTURE, BLOOD (ROUTINE X 2)  CULTURE, BLOOD (ROUTINE X 2)  URINE CULTURE  T4, FREE  URINALYSIS, ROUTINE W REFLEX MICROSCOPIC  I-STAT CG4 LACTIC ACID, ED  I-STAT TROPONIN, ED  I-STAT CG4 LACTIC ACID, ED    EKG None  Radiology Dg Chest Portable 1 View  Result Date: 07/18/2018 CLINICAL DATA:  Hypothermia EXAM: PORTABLE CHEST 1 VIEW COMPARISON:  Portable chest x-ray of July 10, 2018 FINDINGS: The lungs are adequately inflated. There is increased density in the right mid and lower lung. There is persistent increased density at the left lung base. The heart and pulmonary vascularity are normal. There is calcification in the wall of the aortic arch. IMPRESSION: Findings worrisome for posterior layering pleural fluid on the right which appears new since the previous study. Alternatively, atelectasis or pneumonia in the right lower lobe could be present. No CHF. Stable coarse lung markings at the left lung base  likely reflects atelectasis. Thoracic aortic atherosclerosis. Electronically Signed   By: David  Martinique M.D.   On: 07/18/2018 10:49    Procedures .Critical Care Performed by: Kinnie Feil, PA-C Authorized by: Kinnie Feil, PA-C   Critical care provider statement:    Critical care time (minutes):  45   Critical care was necessary to treat or prevent imminent or life-threatening deterioration of the following conditions: hypothermia.   Critical care was time spent personally by me on the following activities:  Discussions with consultants, evaluation of patient's response to treatment, examination of patient, ordering and review of laboratory studies, ordering and review of radiographic studies, re-evaluation of patient's condition, obtaining history from patient or surrogate and review of old charts   I assumed direction of critical care for this patient from another provider in my specialty: no     (including critical care time)  Medications Ordered in ED Medications  metroNIDAZOLE (FLAGYL) IVPB 500 mg (has no administration in time range)  ceFEPIme (MAXIPIME) 2 g in sodium chloride 0.9 % 100 mL IVPB (has no administration in time range)  vancomycin (VANCOCIN) IVPB 1000 mg/200 mL premix (has no administration in time range)  sodium chloride 0.9 % bolus 1,000 mL (1,000 mLs Intravenous New Bag/Given 07/18/18 1001)  ceFEPIme (MAXIPIME) 2 g in sodium chloride 0.9 % 100 mL IVPB (0 g Intravenous Stopped 07/18/18 1141)  vancomycin (VANCOCIN) IVPB 1000 mg/200 mL premix (1,000 mg Intravenous New Bag/Given 07/18/18 1015)     Initial Impression / Assessment and Plan / ED Course  I have reviewed the triage vital signs and the nursing notes.  Pertinent labs & imaging results that were available during my care of the patient were reviewed by me and considered in my medical decision making (see chart for details).  Clinical Course as of Jul 18 1218  Mon Jul 19, 7335  1178 84 year old male  with recent discharge here from nursing home for increased lethargy.  Patient found to be very hypothermic.  He is unable to give any history level 5 caveat.  He is got clear lungs and a soft abdomen.  He is getting sepsis labs and we started antibiotics empirically.   [MB]  1031 Temp(!): 86.5 F (30.3  C) [CG]  1031 Pulse Rate(!): 40 [CG]  1031 Manual  BP(!): 148/110 [CG]  1031 BP: 108/70 [CG]  1031 Alkaline Phosphatase(!): 206 [CG]  1114 ALT(!): 69 [CG]  1114 AST(!): 65 [CG]  1114 Albumin(!): 2.4 [CG]  1114 IMPRESSION: Findings worrisome for posterior layering pleural fluid on the right which appears new since the previous study. Alternatively, atelectasis or pneumonia in the right lower lobe could be present. No CHF. Stable coarse lung markings at the left lung base likely reflects atelectasis.  Thoracic aortic atherosclerosis.    DG Chest Portable 1 View [CG]    Clinical Course User Index [CG] Kinnie Feil, PA-C [MB] Hayden Rasmussen, MD    84 yo with hypothermia, bradycardia.  DC from hospital yesterday for same.  Rectal temperature 86.43F, cold extremities, HR low 40s.  When compared to previous notes, mentation appears at baseline.  Sepsis protocol initiated.   Reviewed pt's DC summary, at DC his temperature, BP, CBG normalized. His AMS seemed to be transient.    1145:Work up in ER remarkable for chronically elevated LFTs.  CXR with new right pleural fluid concerning for atelectasis vs PNA. Empiric abx and 1 l IVF ordered.  Last BP 93/61.  Will request admission for persistent hypothermia, right pleural effusion vs PNA/HCAP.   Shared visit with Dr Melina Copa.  Final Clinical Impressions(s) / ED Diagnoses   Final diagnoses:  Bradycardia  Hypothermia, subsequent encounter    ED Discharge Orders    None       Arlean Hopping 07/18/18 1219    Hayden Rasmussen, MD 07/19/18 1026

## 2018-07-18 NOTE — Progress Notes (Signed)
Hypoglycemic Event  CBG: 53 and 57 with double check  Treatment:Dextrose 50% 1 amp Symptoms: None  Follow-up CBG: [id number] CBG Result132  Possible Reasons for Event: Unknown  Comments/MD notified Dr. Jarome Matin

## 2018-07-18 NOTE — ED Triage Notes (Signed)
Pt here from Nursing home with c/o hypothermia and bradycardia , cbg 71 , pt was d/c from hospital yesterday

## 2018-07-18 NOTE — H&P (Signed)
Date: 07/18/2018               Patient Name:  Jesus Ramsey MRN: [id number]  DOB: 1930/12/27 Age / Sex: 84 y.o., male   PCP: [name], MD         Medical Service: Internal Medicine Teaching Service         Attending Physician: Dr. Bartholomew Crews, MD    First Contact: Dr. Truman Hayward Pager: [phone]  Second Contact: Dr. Trilby Drummer Pager: [phone]       After Hours (After 5p/  First Contact Pager: [phone]  weekends / holidays): Second Contact Pager: [phone]   Chief Complaint: Altered mental status  History of Present Illness: [name] is a 84 yo M w/ PMH of Hypothyroidism, BPH, HTN, and cognitive developmental delays presenting with altered mental status as well as hypoglycemia, hypothermia, and hypotension. His GCS score is 7 with grimacing and withdrawing from pain but no eye opening and no verbal response.  History was obtained from his nursing facility [facility] [phone]). His nurse at [facility] [facility] stated that after his recent discharge from [facility] he arrived in stable condition. On the 7th day after discharge, he had poor oral intake and his diet was changed to Nectar thick liquids. He also was put on 2L of O2 as he had some desat events during feeding. He also became bradycardic during the 7th and his metoprolol was stopped but his temperature was at 97.66F. The next day he continued to be on 2L of O2 but had stable vitals except for the bradycardia. This morning, his weekday nurse came in and found him to be minimally responsive and refusing meals and refusing medications. His temperature was found to be 86 and he was sent by EMS to the ED.   In the ED, he was put on bair huggers, given 1 L fluid and found to have right sided opacity & pleural effusion on X-ray. TSH, CMP, CBC showed normal values. Lactate was normal. Blood and urine culture were collected. He was started on vanc, cef, flagyl for empiric coverage.  Meds:  Acetaminophen 650 mg q6hr PRN ASA 81  mg daily Metoprolol Tartarate 75mg  BID HCTZ 12.5mg  qdaily Atorvastatin 10mg  qhs Tamsulosin 0.4mg  daily Sodium Chloride 1g tab Milk of magnesia 400mg /5mg  PRN Levothyroxine 25mg  qdaily  Allergies: Allergies as of 07/18/2018  . (No Known Allergies)   Past Medical History:  Diagnosis Date  . Altered mental status   . B12 deficiency 01/14/2012  . B12 deficiency anemia   . BPH (benign prostatic hypertrophy)   . Cataract   . Chronic hyponatremia    [name] 01/13/2012  . Dysphagia   . Hypertension   . Mental retardation   . Pneumonia   . Psychogenic polydipsia   . Seizures ([place])     Family History: Pt unable to provide FH  Social History: Pt unable to provide specifics. Currently living at [facility]  Review of Systems: A complete ROS was negative except as per HPI.  Physical Exam: Blood pressure 90/70, pulse (!) 48, temperature (!) 87.9 F (31.1 C), temperature source Rectal, resp. rate 12, SpO2 98 %. Physical Exam  Constitutional:  Cachetic appearing male. GCS 7. Appears not in acute distress but minimally responsive  HENT:  Head: Normocephalic and atraumatic.  Mouth/Throat: No oropharyngeal exudate.  Dry mucous membrane  Eyes:  Will not open his eyes  Neck: Normal range of motion. Neck supple. No JVD present. No tracheal deviation present.  No thyromegaly present.  Cardiovascular: Regular rhythm, normal heart sounds and intact distal pulses.  Bradycardic soft heart sounds  Pulmonary/Chest:  Unable to sit patient up for respiratory exam  Abdominal: Soft. Bowel sounds are normal. He exhibits no distension. There is no tenderness. There is no guarding.  Musculoskeletal: Normal range of motion. He exhibits edema (trace pitting edema bilateral lower extremities).  Neurological: He has normal reflexes.  GCS 7  Skin: Skin is warm and dry. He is not diaphoretic.  Thin, fragile skin on bilateral shins     EKG: personally reviewed my interpretation is  sinus bradycardia, QT prolongation, minimally concave-up ST elevation on leads II,III, V3-V6  CXR: personally reviewed my interpretation is poor penetration. left lower consolidation appears to have resolved. Right sided pleural effusion and consolidation in right lower lobes.  Assessment & Plan by Problem:  [name] is a 84 yo M w/ PMH of Hypothyroidism, BPH, HTN, and cognitive developmental delays presenting with altered mental status as well as hypoglycemia, hypothermia, and hypotension. He was admitted discharged 3 days ago after being admitted for the same issues. During that admission, he was treated empirically for pneumonia and discharged once his temperature, pressure, and glucose was stable. Work-up for adrenal insuffiencey, sepsis, and insulinoma was negative during last admission. It appears he was stable for two days and crashed overnight during his nursing home stay post-discharge. We will work closely with palliative to re-define goals of care during this admission stay as he may be having these unexplained complications due to failure to thrive.  Altered mental status 2/2 hypothermia & hypoglycemia Currently GCS 7: No eye opening, unable to verbalize, grimaces and withdrawing to pain. Work up for causes of hypothermia and hypoglycemia during last admission was negative. Supportive care for now. Temp on admission 86.29F,BG 66 - Bair Huggers for rewarming (core only for central heating wait on heating peripheral) - Palliative Consult - Sulfonylurea panel - Delirium precautions  Hypotension & Bradycardia 2/2 Sepsis vs Failure to Thrive BP 84/52, HR 49, TSH 5.7, T4 WNL (0.93) Lactate 1.19 WBC 4.8 Blood culture and urine culture collected - F/u Blood culture, urine culture - Another bolus of 1L NS - Start D5W @ 75cc/hr after bolus - Hold home bp meds: Metoprolol, HCTZ  Right lung pleural effusion and consolidation 2/2 HCAP vs aspiration Chest X-ray on last admission showed left  lower lobe opacity. Chest X-ray this admission showed right sided pleural effusion and possible consolidation on right lower lobe. Difficult to tell with 1-view. Will get better imaging to determine if possible to tap for thoracentesis while treating empirically. - c/w Cefepime, Vanc, Flagyl - CT Chest w/o contrast  Hypothyroidism: - c/w home med: Synthroid 25mg  qdaily  BPH: - C/w home med: Tamsulosin 0.4mg  daily  DVT prophx: Lovenox Diet: NPO Bowel: Milk of Magnesia Code Status: Partial (pressors okay, no compressions, cardioversion, or intubation)   Dispo: Admit patient to Inpatient with expected length of stay greater than 2 midnights.  Signed: [name], MD 07/18/2018, 1:14 PM  Pager: [phone]

## 2018-07-18 NOTE — ED Notes (Signed)
Unable to obtain 2nd IV after 3 sticks

## 2018-07-18 NOTE — ED Notes (Signed)
Bear hugger placed on pt on arrival

## 2018-07-19 ENCOUNTER — Inpatient Hospital Stay (HOSPITAL_COMMUNITY): Payer: Medicare Other

## 2018-07-19 DIAGNOSIS — T68XXXD Hypothermia, subsequent encounter: Secondary | ICD-10-CM

## 2018-07-19 DIAGNOSIS — R001 Bradycardia, unspecified: Secondary | ICD-10-CM | POA: Diagnosis present

## 2018-07-19 DIAGNOSIS — R627 Adult failure to thrive: Secondary | ICD-10-CM

## 2018-07-19 DIAGNOSIS — E162 Hypoglycemia, unspecified: Secondary | ICD-10-CM

## 2018-07-19 DIAGNOSIS — R531 Weakness: Secondary | ICD-10-CM

## 2018-07-19 DIAGNOSIS — Z515 Encounter for palliative care: Secondary | ICD-10-CM

## 2018-07-19 DIAGNOSIS — R1310 Dysphagia, unspecified: Secondary | ICD-10-CM

## 2018-07-19 LAB — COMPREHENSIVE METABOLIC PANEL
ALT: 53 U/L — AB (ref 0–44)
AST: 47 U/L — AB (ref 15–41)
Albumin: 2 g/dL — ABNORMAL LOW (ref 3.5–5.0)
Alkaline Phosphatase: 153 U/L — ABNORMAL HIGH (ref 38–126)
Anion gap: 5 (ref 5–15)
BUN: 7 mg/dL — AB (ref 8–23)
CHLORIDE: 104 mmol/L (ref 98–111)
CO2: 30 mmol/L (ref 22–32)
CREATININE: 0.87 mg/dL (ref 0.61–1.24)
Calcium: 8 mg/dL — ABNORMAL LOW (ref 8.9–10.3)
GFR calc Af Amer: >60 mL/min (ref >60)
GFR calc non Af Amer: >60 mL/min (ref >60)
GLUCOSE: 64 mg/dL — AB (ref 70–99)
Potassium: 3.8 mmol/L (ref 3.5–5.1)
Sodium: 139 mmol/L (ref 135–145)
Total Bilirubin: 0.5 mg/dL (ref 0.3–1.2)
Total Protein: 5.7 g/dL — ABNORMAL LOW (ref 6.5–8.1)

## 2018-07-19 LAB — GLUCOSE, CAPILLARY
GLUCOSE-CAPILLARY: 61 mg/dL — AB (ref 70–99)
GLUCOSE-CAPILLARY: 96 mg/dL (ref 70–99)
GLUCOSE-CAPILLARY: 117 mg/dL — AB (ref 70–99)
GLUCOSE-CAPILLARY: 127 mg/dL — AB (ref 70–99)
Glucose-Capillary: 130 mg/dL — ABNORMAL HIGH (ref 70–99)
Glucose-Capillary: 149 mg/dL — ABNORMAL HIGH (ref 70–99)
Glucose-Capillary: 53 mg/dL — ABNORMAL LOW (ref 70–99)
Glucose-Capillary: 57 mg/dL — ABNORMAL LOW (ref 70–99)
Glucose-Capillary: 73 mg/dL (ref 70–99)
Glucose-Capillary: 76 mg/dL (ref 70–99)
Glucose-Capillary: 79 mg/dL (ref 70–99)
Glucose-Capillary: 93 mg/dL (ref 70–99)

## 2018-07-19 LAB — CBC
HCT: 32 % — ABNORMAL LOW (ref 39.0–52.0)
Hemoglobin: 10.3 g/dL — ABNORMAL LOW (ref 13.0–17.0)
MCH: 29.1 pg (ref 26.0–34.0)
MCHC: 32.2 g/dL (ref 30.0–36.0)
MCV: 90.4 fL (ref 78.0–100.0)
PLATELETS: 149 10*3/uL — AB (ref 150–400)
RBC: 3.54 MIL/uL — ABNORMAL LOW (ref 4.22–5.81)
RDW: 14.2 % (ref 11.5–15.5)
WBC: 6.5 10*3/uL (ref 4.0–10.5)

## 2018-07-19 LAB — URINE CULTURE: Culture: NO GROWTH

## 2018-07-19 LAB — VITAMIN B12: VITAMIN B 12: 1142 pg/mL — AB (ref 180–914)

## 2018-07-19 MED ORDER — LEVOTHYROXINE SODIUM 100 MCG IV SOLR
12.5000 ug | Freq: Every day | INTRAVENOUS | Status: DC
  Administered 2018-07-19 – 2018-07-24 (×6): 12.5 ug via INTRAVENOUS
  Filled 2018-07-19 (×7): qty 5

## 2018-07-19 MED ORDER — SODIUM CHLORIDE 0.9 % IV BOLUS: 1000.0000 mL | Freq: Once | INTRAVENOUS | Status: DC

## 2018-07-19 MED ORDER — CHLORHEXIDINE GLUCONATE 0.12 % MT SOLN
15.0000 mL | Freq: Two times a day (BID) | OROMUCOSAL | Status: DC
  Administered 2018-07-19 – 2018-07-25 (×10): 15 mL via OROMUCOSAL
  Filled 2018-07-19 (×9): qty 15

## 2018-07-19 MED ORDER — DEXTROSE 50 % IV SOLN
25.0000 mL | Freq: Once | INTRAVENOUS | Status: AC
  Administered 2018-07-19: 25 mL via INTRAVENOUS

## 2018-07-19 MED ORDER — HYDROCORTISONE NA SUCCINATE PF 100 MG IJ SOLR
50.0000 mg | Freq: Four times a day (QID) | INTRAMUSCULAR | Status: DC
  Administered 2018-07-19 – 2018-07-21 (×9): 50 mg via INTRAVENOUS
  Filled 2018-07-19 (×9): qty 2

## 2018-07-19 MED ORDER — SCOPOLAMINE 1 MG/3DAYS TD PT72
1.0000 | MEDICATED_PATCH | TRANSDERMAL | Status: DC
  Administered 2018-07-19 – 2018-07-25 (×3): 1.5 mg via TRANSDERMAL
  Filled 2018-07-19 (×3): qty 1

## 2018-07-19 MED ORDER — ORAL CARE MOUTH RINSE
15.0000 mL | Freq: Two times a day (BID) | OROMUCOSAL | Status: DC
  Administered 2018-07-20 – 2018-07-25 (×9): 15 mL via OROMUCOSAL

## 2018-07-19 MED ORDER — SODIUM CHLORIDE 0.9 % IV BOLUS
1000.0000 mL | Freq: Once | INTRAVENOUS | Status: AC
  Administered 2018-07-19: 1000 mL via INTRAVENOUS

## 2018-07-19 MED ORDER — PHENYLEPHRINE HCL-NACL 10-0.9 MG/250ML-% IV SOLN
0.0000 ug/min | INTRAVENOUS | Status: DC
  Administered 2018-07-19: 20 ug/min via INTRAVENOUS
  Filled 2018-07-19: qty 250

## 2018-07-19 MED ORDER — DEXTROSE 50 % IV SOLN
INTRAVENOUS | Status: AC
  Administered 2018-07-19: 50 mL via INTRAVENOUS
  Filled 2018-07-19: qty 50

## 2018-07-19 MED ORDER — DEXTROSE 50 % IV SOLN
INTRAVENOUS | Status: AC
  Administered 2018-07-19: 25 mL via INTRAVENOUS
  Filled 2018-07-19: qty 50

## 2018-07-19 NOTE — Clinical Social Work Note (Signed)
Clinical Social Work Assessment  Patient Details  Name: Jesus Ramsey MRN: [id number] Date of Birth: 1931/01/22  Date of referral:  07/19/18               Reason for consult:  Facility Placement, Discharge Planning                Permission sought to share information with:  Family Supports Permission granted to share information::  Yes, Release of Information Signed  Name::     Yvetta Coder and [place]::  family  Relationship::  brother adn sister in Financial trader Information:  Yvetta Coder and [place] [phone]  Housing/Transportation Living arrangements for the past 2 months:  [place] of Information:  Other (Comment Required)(pt's sister in Sports coach) Patient Interpreter Needed:  None Criminal Activity/Legal Involvement Pertinent to Current Situation/Hospitalization:  No - Comment as needed Significant Relationships:  Siblings Lives with:  Facility Resident Do you feel safe going back to the place where you live?  Yes Need for family participation in patient care:  Yes (Comment)  Care giving concerns:  CSW consulted ofr discharge planning concerns. CSW aware that pt is from [facility].    Social Worker assessment / plan:  CSW aware that opt is currently disoriented times 4. For this assessment CSW spoke with pt's sister in law [name] as [name] expressed that pt's brother is bed bound. CSW was informed that pt has been at [facility] for almost 3-4 years. Sister in law informed CSW that her daughter [name] is pt's POA. Per [name] though she pays all of pt's bills as [name] lives in [place].   Plan at the time of discharge is for pt to return to [place] if needs permit.   Employment status:  Retired Forensic scientist:  Medicare PT Recommendations:  Not assessed at this time Information / Referral to community resources:     Patient/Family's Response to care:  Pt's sister in law appeared to be understanding and  agreeable to plan of care at this time.   Patient/Family's Understanding of and Emotional Response to Diagnosis, Current Treatment, and Prognosis:  No further questions or concerns have been presented to CSW at this time. Emotional response of sister in law was positive and hopeful and concerned about pt's care.   Emotional Assessment Appearance:  Appears stated age Attitude/Demeanor/Rapport:  Unable to Assess Affect (typically observed):  Unable to Assess Orientation:  (pt is disoriented times 4.) Alcohol / Substance use:  Not Applicable Psych involvement (Current and /or in the community):  No (Comment)  Discharge Needs  Concerns to be addressed:  Denies Needs/Concerns at this time Readmission within the last 30 days:  Yes Current discharge risk:  Dependent with Mobility, Physical Impairment Barriers to Discharge:  Continued Medical Work up   [employer], [place] 07/19/2018, 9:29 AM

## 2018-07-19 NOTE — Progress Notes (Signed)
Paged that Jesus Ramsey's blood pressures trended back down after receiving 1L NS. Evaluated the patient at bedside. BP 83/45. Tachycardic to 104. Satting well on 2L [place].  Neurological status is unchanged (no eye opening, unable to verbalize, grimacing to pain). Patient has bibasilar crackles, a wet cough requiring frequent suction, and periorbital edema. He has received three separate 1L boluses for hypotension with only transient improvements in his pressures. Reluctant to give another bolus due to volume overload. Case discussed with Dr. Oletta Darter. Will start phenylephrine for hypotension and transfer to the ICU.

## 2018-07-19 NOTE — Progress Notes (Signed)
CSW acknowledges consult for SNF placement. CSW aware that ps is from [facility]. CSW made aware that [name] (niece) is pt's POA, however CSW received permission to speak with [name] pt's sister in law regarding pt. CSW will continue to follow for further needs.    Virgie Dad Diva Lemberger, MSW, [facility] Clinical Social Worker [phone]

## 2018-07-19 NOTE — Progress Notes (Signed)
Hypoglycemic Event  CBG: 61  Treatment: D50 IV 50 mL  Symptoms: None  Follow-up CBG: [id number] CBG Result:96  Possible Reasons for Event: Unknown  Comments/MD notified [place] increased dex 10 to 100 cc hr    Karl Pock

## 2018-07-19 NOTE — Consult Note (Signed)
Jesus Ramsey  GQQ:[id number] DOB: 06-05-1931 DOA: 07/18/2018 PCP: [name], MD    LOS: 1 day   Reason for Consult / Chief Complaint:  AMS, hypotension  Consulting MD and date:  Lynnae January 07/19/18  HPI/Summary of hospital stay:  Jesus Ramsey is a 84 y.o. male who has a past medical history of Altered mental status, B12 deficiency (01/14/2012), B12 deficiency anemia, BPH (benign prostatic hypertrophy), Cataract, Chronic hyponatremia, Dysphagia, Hypertension, Mental retardation, Pneumonia, Psychogenic polydipsia, and Seizures ([place]). He had recent admission 07/10/18 through 07/15/18 for septic shock due to HCAP, hypoglycemia, hypothermia.  He was discharged back to nursing home where he has resided for the past 5 years due to his hx of cognitive developmental delay.  He presented back to [facility] on 07/18/18 with AMS (GCS 7), hypoglycemia, hypothermia, hypotension.  Per chart review, he apparently declined roughly 7 days after his discharge when he began to have bradycardia , hypoxia, and decrease in mental status.  He was subsequently sent to the ED for further evaluation and management.  CXR showed right effusion and possible HCAP.  He was admitted for further workup of AMS and sepsis presumed due to HCAP with possible aspiration (started on vanc, cefepime, flagyl).  During early AM hours 9/10, he had recurrent hypotension that had minimal response to IVF's.  He was subsequently started on low dose neosynephrine and PCCM was therefore consulted for transfer to the ICU.  Per goals of care discussion with family and IM team, pt is a DNR / DNI; however, vasopressors are OK.  A palliative care consult has been placed for AM 9/10.  Subjective:  Non-responsive.  Does have + gag and cough.  MAP high 80's on 63mcg/min of neosynephrine.  Objective   Blood pressure 126/60, pulse 81, temperature (!) 96.9 F (36.1 C), temperature source Rectal, resp. rate (!) 24, height 5' 10.98" (1.803 m), weight 69.5 kg, SpO2  98 %.        Intake/Output Summary (Last 24 hours) at 07/19/2018 0446 Last data filed at 07/19/2018 0300 Gross per 24 hour  Intake 4105.02 ml  Output 500 ml  Net 3605.02 ml   Filed Weights   07/18/18 1700 07/18/18 2000  Weight: 69.5 kg 69.5 kg    Examination: General: Elderly male, chronically ill appearing, resting in bed, in NAD. Neuro: Non-responsive.  + gag. HEENT: [place]/AT. Sclerae anicteric. Cardiovascular: RRR, no M/R/G.  Lungs: Respirations even and unlabored.  Coarse in bases. Abdomen: BS x 4, soft, NT/ND.  Musculoskeletal: No gross deformities, no edema.  Skin: Intact, warm, no rashes.  Consults: date of consult/date signed off & final recs:  PCCM 9/10  Procedures: None.  Significant Diagnostic Tests: CT chest 9/9 > multilobar PNA with small effusions and bilateral atelectasis.  Micro Data: Blood 9/9 >  Sputum 9/9 >   Antimicrobials:  Vanc 9/9 >  Cefepime 9/9 >  Flagyl 9/9 >    Resolved Hospital Problem list    Assessment & Plan:   Septic shock - presumed due to HCAP with possible aspiration given his hx. Plan: Continue low dose neo via PIV. No CVL for now - palliative care to see in AM 9/10. Continue empiric abx and follow cultures. Add stress dose steroids (Cortisol from 9/4 was 12).  Failure to thrive - with underlying hx of mental retardation / developmental delays. Plan: Agree with palliative care consult.  Acute metabolic encephalopathy - unclear etiology at this point but certainly confounded with sepsis, hypoglycemia, hypotension.  Electrolyte panel normal,  TSH mildly elevated, free T4 normal.  No long acting sedating meds seen on MAR. Plan: Assess B12, folate. Continue supportive care.  Hx hypothyroidism - initially based off clinical features, suspected possible component of myxedema; however, only mildly elevated TSH with normal free T4 makes this less likely. Relative AI - cortisol from 9/4 was 12. Plan: Continue synthroid. Added  stress steroids.  Small bilateral pleural effusions and bilateral atelectasis. Plan: Continue supportive care. No interventions for now, await palliative care consult.  Disposition / Summary of Today's Plan 07/19/18   Transferred to ICU given low dose neosynephrine needs. Continue empiric abx.  Start stress steroids. Palliative care consult placed for AM 9/10 - agree with goals of care discussions given pt's failure to thrive.  Best Practice / Goals of Care / Disposition.   DVT prophylaxis: SCD's / lovenox. GI prophylaxis: N/A. Diet: NPO. Mobility: Bedrest. Code Status: Partial - DNR / DNI, vasopressors OK. Family Communication: None available.   Labs   CBC: Recent Labs  Lab 07/13/18 0345 07/18/18 0955 07/18/18 1031  WBC 4.6 4.8  --   NEUTROABS  --  3.1  --   HGB 9.8* 12.1* 12.6*  HCT 30.3* 38.2* 37.0*  MCV 91.8 91.0  --   PLT 124* 111*  --    Basic Metabolic Panel: Recent Labs  Lab 07/13/18 0345 07/14/18 0512 07/15/18 0409 07/18/18 0955 07/18/18 1031 07/18/18 1931  NA 138 132* 136 140 138 139  K 3.8 3.5 3.7 4.2 4.4 3.8  CL 103 96* 98 101 100 102  CO2 30 29 31  32  --  31  GLUCOSE 126* 105* 110* 72 66* 63*  BUN 8 8 10  7* 8 7*  CREATININE 0.78 0.66 0.63 0.61 0.60* 0.65  CALCIUM 8.4* 8.5* 8.8* 9.1  --  8.3*  PHOS  --   --  2.3*  --   --   --    GFR: Estimated Creatinine Clearance: 66.4 mL/min (by C-G formula based on SCr of 0.65 mg/dL). Recent Labs  Lab 07/13/18 0345 07/18/18 0955 07/18/18 1032  WBC 4.6 4.8  --   LATICACIDVEN  --   --  1.19   Liver Function Tests: Recent Labs  Lab 07/13/18 0345 07/14/18 0512 07/18/18 0955  AST 60* 48* 65*  ALT 72* 65* 69*  ALKPHOS 222* 193* 206*  BILITOT 0.8 0.7 0.4  PROT 6.4* 6.2* 6.8  ALBUMIN 2.2* 2.2* 2.4*   No results for input(s): LIPASE, AMYLASE in the last 168 hours. No results for input(s): AMMONIA in the last 168 hours. ABG    Component Value Date/Time   PHART 7.401 07/10/2018 2037   PCO2ART  40.1 07/10/2018 2037   PO2ART 87.0 07/10/2018 2037   HCO3 25.7 07/10/2018 2037   TCO2 33 (H) 07/18/2018 1031   O2SAT 98.0 07/10/2018 2037    Coagulation Profile: No results for input(s): INR, PROTIME in the last 168 hours. Cardiac Enzymes: No results for input(s): CKTOTAL, CKMB, CKMBINDEX, TROPONINI in the last 168 hours. HbA1C: Hgb A1c MFr Bld  Date/Time Value Ref Range Status  01/14/2012 12:05 AM 6.0 (H) <5.7 % Final    Comment:    (NOTE)  According to the ADA Clinical Practice Recommendations for 2011, when HbA1c is used as a screening test:  >=6.5%   Diagnostic of Diabetes Mellitus           (if abnormal result is confirmed) 5.7-6.4%   Increased risk of developing Diabetes Mellitus References:Diagnosis and Classification of Diabetes Mellitus,Diabetes CBJS,2831,51(VOHYW 1):S62-S69 and Standards of Medical Care in         Diabetes - 2011,Diabetes VPXT,0626,94 (Suppl 1):S11-S61.   CBG: Recent Labs  Lab 07/18/18 2102 07/18/18 2211 07/19/18 0005 07/19/18 0219 07/19/18 0312  GLUCAP 132* 79 76 61* 96     Review of Systems:   Unable to obtain as pt is encephalopathic.  Past medical history  He,  has a past medical history of Altered mental status, B12 deficiency (01/14/2012), B12 deficiency anemia, BPH (benign prostatic hypertrophy), Cataract, Chronic hyponatremia, Dysphagia, Hypertension, Mental retardation, Pneumonia, Psychogenic polydipsia, and Seizures ([place]).   Surgical History    Past Surgical History:  Procedure Laterality Date  . NO PAST SURGERIES       Social History   Social History   Socioeconomic History  . Marital status: Single    Spouse name: Not on file  . Number of children: Not on file  . Years of education: Not on file  . Highest education level: Not on file  Occupational History  . Not on file  Social Needs  . Financial resource strain: Not on file  . Food insecurity:     Worry: Not on file    Inability: Not on file  . Transportation needs:    Medical: Not on file    Non-medical: Not on file  Tobacco Use  . Smoking status: Former Research scientist (life sciences)  . Smokeless tobacco: Never Used  Substance and Sexual Activity  . Alcohol use: No  . Drug use: No  . Sexual activity: Not on file  Lifestyle  . Physical activity:    Days per week: Not on file    Minutes per session: Not on file  . Stress: Not on file  Relationships  . Social connections:    Talks on phone: Not on file    Gets together: Not on file    Attends religious service: Not on file    Active member of club or organization: Not on file    Attends meetings of clubs or organizations: Not on file    Relationship status: Not on file  . Intimate partner violence:    Fear of current or ex partner: Not on file    Emotionally abused: Not on file    Physically abused: Not on file    Forced sexual activity: Not on file  Other Topics Concern  . Not on file  Social History Narrative  . Not on file  ,  reports that he has quit smoking. He has never used smokeless tobacco. He reports that he does not drink alcohol or use drugs.   Family history   His Family history is unknown by patient.   Allergies No Known Allergies  Home meds  Prior to Admission medications   Medication Sig Start Date End Date Taking? Authorizing Provider  acetaminophen (TYLENOL) 325 MG tablet Take 650 mg by mouth every 6 (six) hours as needed. pain   Yes [provider]  aspirin 81 MG chewable tablet Chew 81 mg by mouth daily.   Yes [provider]  hydrochlorothiazide (HYDRODIURIL) 12.5 MG tablet Take 12.5 mg by mouth every morning. 06/29/18  Yes [provider]  latanoprost (XALATAN) 0.005 % ophthalmic solution Place 1 drop into both eyes at bedtime.   Yes [provider]  levothyroxine (SYNTHROID, LEVOTHROID) 25 MCG tablet Take 25 mcg by mouth every morning. 07/06/18  Yes [provider]    magnesium hydroxide (MILK OF MAGNESIA) 400 MG/5ML suspension Take 30 mLs by mouth daily as needed. For constipation   Yes [provider]  Metoprolol Tartrate 75 MG TABS Take 75 mg by mouth 2 (two) times daily. Hold if SBP is <110 or HR <60   Yes [provider]  sodium chloride 1 G tablet Take 1 g by mouth every morning.   Yes [provider]  Tamsulosin HCl (FLOMAX) 0.4 MG CAPS Take 0.4 mg by mouth daily.    Yes [provider]  timolol (TIMOPTIC) 0.5 % ophthalmic solution Place 1 drop into both eyes every morning. 05/23/18  Yes [provider]      Montey Hora, [facility] Pager: [phone]  or [phone] 07/19/2018, 4:46 AM

## 2018-07-19 NOTE — Consult Note (Signed)
Consultation Note Date: 07/19/2018   Patient Name: Jesus Ramsey  DOB: [birth year]  MRN: [id number]  Age / Sex: 84 y.o., male  PCP: [name], MD Referring Physician: Bartholomew Crews, MD  Reason for Consultation: Establishing goals of care and Psychosocial/spiritual support  HPI/Patient Profile: 84 y.o. male   admitted on 07/18/2018 w/ PMH of Hypothyroidism, BPH, HTN, and cognitive developmental delays presenting with altered mental status as well as hypoglycemia, hypothermia, and hypotension.   And had a recent admission with similar medical conditions and was discharged 4 days ago back to his skilled nursing facility.  I spoke to the liaison for his skilled nursing facility and they speak to hope to implement hospice if [name] returns to the facility.  This will have to be discussed with family when transition of care is in planning.   Chest x-ray work-up significant  for right sided pleural effusion and consolidation on the right.  Patient is high risk for aspiration.    On previous admission family had  multiple conversations with palliative medicine and the  primary team regarding overall long-term poor prognosis and high risk for decompensation.  This were discussed in detail. MOST form was introduced and a Hard Choices booklet was left for review.  Family once again face treatment option decisions, advanced directive decisions and anticipatory.  Clinical Assessment and Goals of Care:  This NP Wadie Lessen reviewed medical records, received report from team, assessed the patient and then spoke by phone to both his sister-in-law [name] and his niece [name], who are his main decision makers at this time, to discuss diagnosis, prognosis, GOC, EOL wishes disposition and options.  I met this family on most recent admission only about a week ago and had multiple conversations regarding  overall long-term poor prognosis and high risk for decompensation.  Today we spoke again regarding  advanced directives.  Concepts specific to code status, artifical feeding and hydration, continued IV antibiotics and rehospitalization was had.  The difference between a aggressive medical intervention path  and a palliative comfort care path for this patient at this time was had.  Values and goals of care important to patient and family were attempted to be elicited.  At this time family is open to all offered and available medical interventions to prolong life.  Discussed that  patient is high risk for aspiration secondary to his weakness and difficulty swallowing.  Family are in a place of questioning and considering artificial feeding source, ie a PEG.  Discussed the risks and benefits of PEG for artificial feeding and detail the fact that the patient would still be high risk for aspiration secondary to aspiration of oral secretions.   [name]/niece tells me that she is planning on coming to the hospital towards the end of the week, likely late Thursday or Friday morning.  They wish to continue to treat the treatable for the next few days and family will make further decisions regarding goals of care depending on patient outcomes over the next  few days.   Questions and concerns addressed.     Discussed the above with critical care attending Dr. Lynetta Mare.  He plans to speak with the niece by phone tomorrow and request the palliative medicine participate in that phone call.  I will not be in the hospital tomorrow but I did discuss with my colleague Emilio Aspen NP, plans to touch base with critical care in the morning and see how palliative medicine can be of assistance.    I let the family know that this nurse practitioner would not be back in the hospital over the next 5 days but they are given the contact information for the team phone to contact another provider for palliative medicine needs.     SUMMARY OF RECOMMENDATIONS    Code Status/Advance Care Planning:  Limited code   Palliative Prophylaxis:   Aspiration, Bowel Regimen, Delirium Protocol, Frequent Pain Assessment and Oral Care  Additional Recommendations (Limitations, Scope, Preferences): Full Scope Treatment -family is open to all offered and available medical interventions to prolong life.  Psycho-social/Spiritual:   Desire for further Chaplaincy support:no  Additional Recommendations: Education on Hospice  Prognosis:   Unable to determine-long term poor prognosis  Discharge Planning: To Be Determined      Primary Diagnoses: Present on Admission: . Hypothermia   I have reviewed the medical record, interviewed the patient and family, and examined the patient. The following aspects are pertinent.  Past Medical History:  Diagnosis Date  . Altered mental status   . B12 deficiency 01/14/2012  . B12 deficiency anemia   . BPH (benign prostatic hypertrophy)   . Cataract   . Chronic hyponatremia    [name] 01/13/2012  . Dysphagia   . Hypertension   . Mental retardation   . Pneumonia   . Psychogenic polydipsia   . Seizures ([place])    Social History   Socioeconomic History  . Marital status: Single    Spouse name: Not on file  . Number of children: Not on file  . Years of education: Not on file  . Highest education level: Not on file  Occupational History  . Not on file  Social Needs  . Financial resource strain: Not on file  . Food insecurity:    Worry: Not on file    Inability: Not on file  . Transportation needs:    Medical: Not on file    Non-medical: Not on file  Tobacco Use  . Smoking status: Former Research scientist (life sciences)  . Smokeless tobacco: Never Used  Substance and Sexual Activity  . Alcohol use: No  . Drug use: No  . Sexual activity: Not on file  Lifestyle  . Physical activity:    Days per week: Not on file    Minutes per session: Not on file  . Stress: Not on file  Relationships  .  Social connections:    Talks on phone: Not on file    Gets together: Not on file    Attends religious service: Not on file    Active member of club or organization: Not on file    Attends meetings of clubs or organizations: Not on file    Relationship status: Not on file  Other Topics Concern  . Not on file  Social History Narrative  . Not on file   Family History  Family history unknown: Yes   Scheduled Meds: . enoxaparin (LOVENOX) injection  40 mg Subcutaneous Q24H  . hydrocortisone sod succinate (SOLU-CORTEF) inj  50 mg Intravenous Q6H  .  levothyroxine  12.5 mcg Intravenous Daily  . scopolamine  1 patch Transdermal Q72H  . sodium chloride flush  3 mL Intravenous Q12H   Continuous Infusions: . sodium chloride    . ceFEPime (MAXIPIME) IV 2 g (07/18/18 2158)  . dextrose 130 mL/hr at 07/19/18 0849  . metronidazole 500 mg (07/19/18 1040)  . phenylephrine (NEO-SYNEPHRINE) Adult infusion Stopped (07/19/18 0555)  . sodium chloride Stopped (07/19/18 0458)  . vancomycin     PRN Meds:.sodium chloride, acetaminophen **OR** acetaminophen, ondansetron **OR** ondansetron (ZOFRAN) IV, sodium chloride flush Medications Prior to Admission:  Prior to Admission medications   Medication Sig Start Date End Date Taking? Authorizing Provider  acetaminophen (TYLENOL) 325 MG tablet Take 650 mg by mouth every 6 (six) hours as needed. pain   Yes [provider]  aspirin 81 MG chewable tablet Chew 81 mg by mouth daily.   Yes [provider]  hydrochlorothiazide (HYDRODIURIL) 12.5 MG tablet Take 12.5 mg by mouth every morning. 06/29/18  Yes [provider]  latanoprost (XALATAN) 0.005 % ophthalmic solution Place 1 drop into both eyes at bedtime.   Yes [provider]  levothyroxine (SYNTHROID, LEVOTHROID) 25 MCG tablet Take 25 mcg by mouth every morning. 07/06/18  Yes [provider]  magnesium hydroxide (MILK OF MAGNESIA) 400 MG/5ML suspension Take 30 mLs by  mouth daily as needed. For constipation   Yes [provider]  Metoprolol Tartrate 75 MG TABS Take 75 mg by mouth 2 (two) times daily. Hold if SBP is <110 or HR <60   Yes [provider]  sodium chloride 1 G tablet Take 1 g by mouth every morning.   Yes [provider]  Tamsulosin HCl (FLOMAX) 0.4 MG CAPS Take 0.4 mg by mouth daily.    Yes [provider]  timolol (TIMOPTIC) 0.5 % ophthalmic solution Place 1 drop into both eyes every morning. 05/23/18  Yes [provider]   No Known Allergies Review of Systems  Unable to perform ROS   Physical Exam  Constitutional: He appears cachectic. He appears ill.  Cardiovascular: Normal rate, regular rhythm and normal heart sounds.  Pulmonary/Chest: Effort normal and breath sounds normal.  Musculoskeletal:  Generalized weakness and muscle atrophy  Neurological: He is alert.  -Patient is nonverbal and unable to follow commands at the present time.  Skin: Skin is warm and dry.    Vital Signs: BP 104/61   Pulse 90   Temp 98.4 F (36.9 C) (Oral)   Resp (!) 24   Ht 5' 10.98" (1.803 m)   Wt 71.7 kg   SpO2 98%   BMI 22.06 kg/m  Pain Scale: Faces   Pain Score: 0-No pain   SpO2: SpO2: 98 % O2 Device:SpO2: 98 % O2 Flow Rate: .   IO: Intake/output summary:   Intake/Output Summary (Last 24 hours) at 07/19/2018 1121 Last data filed at 07/19/2018 1000 Gross per 24 hour  Intake 4473.35 ml  Output 625 ml  Net 3848.35 ml    LBM: Last BM Date: 07/18/18 Baseline Weight: Weight: 69.5 kg Most recent weight: Weight: 71.7 kg     Palliative Assessment/Data: 20 %     Discussed with Dr Ashley Mariner with CCM  Time In: 1100 Time Out: 1215 Time Total: 75 minutes Greater than 50%  of this time was spent counseling and coordinating care related to the above assessment and plan.  Signed by: Wadie Lessen, NP   Please contact Palliative Medicine Team phone at [phone] for questions and  concerns.  For  individual provider: See Shea Evans

## 2018-07-19 NOTE — Progress Notes (Signed)
Report called to alma for Rm 2M12- pt transported to ICU in guarded condition. accompied by Rapid response RN and primary RN from floor.

## 2018-07-19 NOTE — Significant Event (Addendum)
Rapid Response Event Note  I received call from 2W staff about patient. Patient is well known to me, I responded to RR on the patient the last admission. RN informed me that patient was hypotensive, hypothermic, and was hypoglycemic.   I asked the RNs page the primary service because I was on another call, seeing another patient. I did inform that the staff, that these were ongoing issues for this patient, and further management needed to be done by the primary service.   I did call for an update, per RN, primary service did come to the bedside, orders given for patient to receive 1L NS, mental status remains unchanged, slight improvement in BP. Temperature has improved and blood sugars remain in the 70s despite being on D10 at 75cc/hr. Empiric ABX started.   Should the patient require pressors or other interventions call RRT. My overall clinical impression is patient's seems like he is failing to thrive.  I called back at 0245 for an update, patient was had ongoing hypoglycemia and hypotension, BP had improved briefly but now was SBP in the 80s and MAPs in the 50s. I paged the primary service MD and we discussed the patient. Primary service will call PCCM, I was paged to an emergency, so I asked that the primary service keep me informed, and I will help facilitate as best as I can.   Primary team updated me, PCCM was consulted, will start Phenylephrine, primary team present at bedside. I started the PE at 20 mcg, SBP in the 120s, and MAP > 65. PCCM PA came to see patient, PE was weaned down to 10 mcg and patient was transferred to ICU. Patient had already received 3L, edema + 1 in BLE, facial edema, lung sounds- coarse crackles throughout, + persistent cough - thick white secretions.   Upon arrival to ICU, Blood Sugar was 57. ICU RN treated with 1/2 Amp Dextrose.   2nd Time 0245 End Time 500

## 2018-07-19 NOTE — Progress Notes (Signed)
Jesus Ramsey  VXY:[id number] DOB: 1931/08/28 DOA: 07/18/2018 PCP: [name], MD    LOS: 1 day   Reason for Consult / Chief Complaint:  Altered mental status and hypotension  Consulting MD and date:  Hospitalist.  HPI/Summary of hospital stay:  Jesus Ramsey is a 84 y.o. male who has a past medical history of Altered mental status, B12 deficiency (01/14/2012), B12 deficiency anemia, BPH (benign prostatic hypertrophy), Cataract, Chronic hyponatremia, Dysphagia, Hypertension, Mental retardation, Pneumonia, Psychogenic polydipsia, and Seizures ([place]). He had recent admission 07/10/18 through 07/15/18 for septic shock due to HCAP, hypoglycemia, hypothermia.  He was discharged back to nursing home where he has resided for the past 5 years due to his hx of cognitive developmental delay.  Admitted 07/18/18 with AMS (GCS 7), hypoglycemia, hypothermia, hypotension.  Per chart review, he apparently declined roughly 7 days after his discharge when he began to have bradycardia , hypoxiato , and decrease in mental status.  During early AM hours 9/10, he had recurrent hypotension that had minimal response to IVF's.  He was subsequently started on low dose neosynephrine and PCCM was therefore consulted for transfer to the ICU.  Per goals of care discussion with family and IM team, pt is a DNR / DNI; however, vasopressors are OK.  A palliative care consult has been placed for AM 9/10.  Subjective:  Still unresponsive with copious secretion pooling.  Objective   Blood pressure (!) 106/57, pulse 82, temperature (!) 97 F (36.1 C), temperature source Axillary, resp. rate (!) 23, height 5' 10.98" (1.803 m), weight 71.7 kg, SpO2 97 %.        Intake/Output Summary (Last 24 hours) at 07/19/2018 0840 Last data filed at 07/19/2018 0700 Gross per 24 hour  Intake 4473.35 ml  Output 625 ml  Net 3848.35 ml   Filed Weights   07/18/18 1700 07/18/18 2000 07/19/18 0500  Weight: 69.5 kg 69.5 kg 71.7 kg     Examination: General: profound cachexia. HENT: ++ secretion pooling in oropharynx. Lungs: clear Cardiovascular: warm. HS normal Abdomen: soft. Extremities: muscle wasting. Edema. Neuro: Minimal response to pain GU: Catheter in place.  Consults: date of consult/date signed off & final recs:   Palliative care. Ongoing discussion regarding goals of care.  Procedures: N/A  Significant Diagnostic Tests: N/A  Micro Data: negative  Antimicrobials:  Vanc 9/9 >  Cefepime 9/9 >  Flagyl 9/9 >  Resolved Hospital Problem list     Assessment & Plan:   Constellation of hypotension, hypothermia, hypoglycemia in absence of signs of clear infection can be explained by malnutrition. Patient not able to eat and unclear if current state may reflect general progression of his condition. Further clarification of goals of care before initiating tube feeds.  Disposition / Summary of Today's Plan 07/19/18   Discuss goals of care.  Best Practice / Goals of Care / Disposition.   DVT prophylaxis: UFH GI prophylaxis: not required. Diet: NPO Mobility:bed-bound Code Status: DNR/DNI Family Communication: no family update today.  Labs   CBC: Recent Labs  Lab 07/13/18 0345 07/18/18 0955 07/18/18 1031 07/19/18 0543  WBC 4.6 4.8  --  6.5  NEUTROABS  --  3.1  --   --   HGB 9.8* 12.1* 12.6* 10.3*  HCT 30.3* 38.2* 37.0* 32.0*  MCV 91.8 91.0  --  90.4  PLT 124* 111*  --  827*   Basic Metabolic Panel: Recent Labs  Lab 07/14/18 0512 07/15/18 0409 07/18/18 0955 07/18/18 1031 07/18/18 1931  07/19/18 0543  NA 132* 136 140 138 139 139  K 3.5 3.7 4.2 4.4 3.8 3.8  CL 96* 98 101 100 102 104  CO2 29 31 32  --  31 30  GLUCOSE 105* 110* 72 66* 63* 64*  BUN 8 10 7* 8 7* 7*  CREATININE 0.66 0.63 0.61 0.60* 0.65 0.87  CALCIUM 8.5* 8.8* 9.1  --  8.3* 8.0*  PHOS  --  2.3*  --   --   --   --    GFR: Estimated Creatinine Clearance: 63 mL/min (by C-G formula based on SCr of 0.87  mg/dL). Recent Labs  Lab 07/13/18 0345 07/18/18 0955 07/18/18 1032 07/19/18 0543  WBC 4.6 4.8  --  6.5  LATICACIDVEN  --   --  1.19  --    Liver Function Tests: Recent Labs  Lab 07/13/18 0345 07/14/18 0512 07/18/18 0955 07/19/18 0543  AST 60* 48* 65* 47*  ALT 72* 65* 69* 53*  ALKPHOS 222* 193* 206* 153*  BILITOT 0.8 0.7 0.4 0.5  PROT 6.4* 6.2* 6.8 5.7*  ALBUMIN 2.2* 2.2* 2.4* 2.0*   No results for input(s): LIPASE, AMYLASE in the last 168 hours. No results for input(s): AMMONIA in the last 168 hours. ABG    Component Value Date/Time   PHART 7.401 07/10/2018 2037   PCO2ART 40.1 07/10/2018 2037   PO2ART 87.0 07/10/2018 2037   HCO3 25.7 07/10/2018 2037   TCO2 33 (H) 07/18/2018 1031   O2SAT 98.0 07/10/2018 2037    Coagulation Profile: No results for input(s): INR, PROTIME in the last 168 hours. Cardiac Enzymes: No results for input(s): CKTOTAL, CKMB, CKMBINDEX, TROPONINI in the last 168 hours. HbA1C: Hgb A1c MFr Bld  Date/Time Value Ref Range Status  01/14/2012 12:05 AM 6.0 (H) <5.7 % Final    Comment:    (NOTE)                                                                       According to the ADA Clinical Practice Recommendations for 2011, when HbA1c is used as a screening test:  >=6.5%   Diagnostic of Diabetes Mellitus           (if abnormal result is confirmed) 5.7-6.4%   Increased risk of developing Diabetes Mellitus References:Diagnosis and Classification of Diabetes Mellitus,Diabetes IONG,2952,84(XLKGM 1):S62-S69 and Standards of Medical Care in         Diabetes - 2011,Diabetes WNUU,7253,66 (Suppl 1):S11-S61.   CBG: Recent Labs  Lab 07/19/18 0312 07/19/18 0440 07/19/18 0507 07/19/18 0549 07/19/18 0624  GLUCAP 96 54* 42 [place], MD [facility] ICU Physician [place]  Pager: [phone] Mobile: [phone] After hours: [phone].

## 2018-07-19 NOTE — Progress Notes (Addendum)
Paged by nursing that patient was hypotensive to 79/44. Patient also desatted to 88% and was placed on 2L [place] with improvement in his oxygenation. Evaluated the patient at bedside. He appeared to be resting comfortably in trendelenburg. His exam was unchanged from today's H&P (no eye opening, unable to verbalize, grimacing to pain). Ordered 1L NS bolus. BP improved to 104/56. Low threshold to consult CCM if patient becomes hypotensive again.

## 2018-07-19 NOTE — Progress Notes (Signed)
eLink Physician-Brief Progress Note Patient Name: Jesus Ramsey DOB: July 12, 1931 MRN: [id number]   Date of Service  07/19/2018  HPI/Events of Note  Hypoglycemia - Blood glucose = 53.  eICU Interventions  Will increase D10W from 100 mL/hour to 130 mL/hour.      Intervention Category Major Interventions: Other:  Sommer,Steven Cornelia Copa 07/19/2018, 6:21 AM

## 2018-07-19 NOTE — Progress Notes (Signed)
Pt's BP dropped see vs sheet- paged MD and charge nurse tracie said to have MD come and see pt. MD came and saw pt. Ordered bolus of NS. Put pt in trendelenburg bp still low . Will continue to monitor. sats dropped to 88% put 02 on 2l [place] sats came up to 90's

## 2018-07-20 DIAGNOSIS — J988 Other specified respiratory disorders: Secondary | ICD-10-CM

## 2018-07-20 DIAGNOSIS — Z7189 Other specified counseling: Secondary | ICD-10-CM

## 2018-07-20 LAB — GLUCOSE, CAPILLARY
GLUCOSE-CAPILLARY: 57 mg/dL — AB (ref 70–99)
GLUCOSE-CAPILLARY: 153 mg/dL — AB (ref 70–99)
GLUCOSE-CAPILLARY: 117 mg/dL — AB (ref 70–99)
GLUCOSE-CAPILLARY: 118 mg/dL — AB (ref 70–99)
GLUCOSE-CAPILLARY: 142 mg/dL — AB (ref 70–99)
Glucose-Capillary: 53 mg/dL — ABNORMAL LOW (ref 70–99)
Glucose-Capillary: 151 mg/dL — ABNORMAL HIGH (ref 70–99)
Glucose-Capillary: 139 mg/dL — ABNORMAL HIGH (ref 70–99)
Glucose-Capillary: 133 mg/dL — ABNORMAL HIGH (ref 70–99)
Glucose-Capillary: 105 mg/dL — ABNORMAL HIGH (ref 70–99)

## 2018-07-20 LAB — FOLATE RBC
FOLATE, HEMOLYSATE: 372.5 ng/mL
Folate, RBC: 1209 ng/mL (ref >498)
Hematocrit: 30.8 % — ABNORMAL LOW (ref 37.5–51.0)

## 2018-07-20 NOTE — Progress Notes (Signed)
Daily Progress Note   Patient Name: Jesus Ramsey       Date: 07/20/2018 DOB: [birth year]  Age: 84 y.o. MRN#: [id number] Attending Physician: Bartholomew Crews, MD Primary Care Physician: [name], MD Admit Date: 07/18/2018  Reason for Consultation/Follow-up: Establishing goals of care  Subjective: Rounded on patient with Dr. Lynetta Mare. Patient is not clinically progressing. He tells me his name, but not other verbal responses. Having copious secretions that he is having difficulty controlling. Per Dr. Lynetta Mare there are no signs of infectious processes and antibiotics will be discontinued. Dr. Lynetta Mare attempted to call patient's niece Jesus Ramsey [facility]) for [place] discussion but no answer.  I was able to reach niece later in day. Discussed patient's poor prognosis for meaningful functional recovery and appearance that he is at end of life. We discussed likelihood on ongoing cycle of continued rehospitalizations and continued decline. Discussed that there appeared to be no reversible illness, patient was heading toward end of life, and while IV fluids are prolonging life, they are not increasing his function or improving quality. Jesus Ramsey acknowledged and agreed with this assessment. Jesus Ramsey stated she does not plan to prolong patient's life artificially if it does not appear he will recover to a prior state of function.  She inquired about comfort care, Hospice and disposition. She stated she would prefer patient go to [facility] if he could survive the transfer once he was transitioned to comfort measures.   She is arriving into town tomorrow evening and needs to discuss above with her parents in person.     Review of Systems  Unable to perform ROS: Acuity of condition    Length of Stay:  2  Current Medications: Scheduled Meds:  . chlorhexidine  15 mL Mouth Rinse BID  . enoxaparin (LOVENOX) injection  40 mg Subcutaneous Q24H  . hydrocortisone sod succinate (SOLU-CORTEF) inj  50 mg Intravenous Q6H  . levothyroxine  12.5 mcg Intravenous Daily  . mouth rinse  15 mL Mouth Rinse q12n4p  . scopolamine  1 patch Transdermal Q72H  . sodium chloride flush  3 mL Intravenous Q12H    Continuous Infusions: . sodium chloride 10 mL/hr at 07/20/18 1000  . dextrose 130 mL/hr at 07/20/18 1000  . sodium chloride Stopped (07/19/18 0458)    PRN Meds: sodium chloride, acetaminophen **OR** acetaminophen,  ondansetron **OR** ondansetron (ZOFRAN) IV, sodium chloride flush  Physical Exam  Constitutional:  cachetic  Pulmonary/Chest: Effort normal.  Neurological:  Lethargic, states name only, nonverbal otherwise  Skin: Skin is dry.  Nursing note and vitals reviewed.           Vital Signs: BP (!) 142/75   Pulse (!) 56   Temp (!) 97.5 F (36.4 C) (Axillary)   Resp 18   Ht 5' 10.98" (1.803 m)   Wt 71.8 kg   SpO2 100%   BMI 22.09 kg/m  SpO2: SpO2: 100 % O2 Device: O2 Device: Nasal Cannula O2 Flow Rate: O2 Flow Rate (L/min): 2 L/min  Intake/output summary:   Intake/Output Summary (Last 24 hours) at 07/20/2018 1358 Last data filed at 07/20/2018 1020 Gross per 24 hour  Intake 3491.57 ml  Output 1575 ml  Net 1916.57 ml   LBM: Last BM Date: 07/18/18 Baseline Weight: Weight: 69.5 kg Most recent weight: Weight: 71.8 kg       Palliative Assessment/Data: PPS: 10%     Patient Active Problem List   Diagnosis Date Noted  . Bradycardia   . Hypoglycemia   . Protein-calorie malnutrition, severe 07/14/2018  . Adult failure to thrive   . Acute metabolic encephalopathy   . Aspiration pneumonia due to gastric secretions ([place])   . Palliative care by specialist   . Altered mental status 07/10/2018  . Hypothermia 07/10/2018  . Severe sepsis with septic shock ([place])   .  Hypothyroidism   . Community acquired pneumonia of left lower lobe of lung ([place])   . Acute cystitis without hematuria   . DNR (do not resuscitate) discussion   . Dysphagia 04/08/2015  . Seizures ([place]) 08/10/2014  . Essential hypertension 08/10/2014  . Absolute anemia 08/10/2014  . Weakness generalized 08/10/2014  . Psychogenic polydipsia 01/14/2012  . BPH (benign prostatic hyperplasia) 01/14/2012  . Mental retardation 01/13/2012  . Hyponatremia 01/13/2012    Palliative Care Assessment & Plan   Patient Profile:84 y.o. male   admitted on 07/18/2018 w/ PMH of Hypothyroidism, BPH, HTN, and cognitive developmental delays presenting with altered mental status as well as hypoglycemia, hypothermia, and hypotension. And had a recent admission with similar medical conditions and was discharged 4 days ago back to his skilled nursing facility. Chest x-ray work-up significant  for right sided pleural effusion and consolidation on the right.  Patient is high risk for aspiration.    Assessment/Recommendations/Plan   Continue current care  Patient's niece Jesus Ramsey to be here around 0930 Friday morning and PMT will touch base with her then- anticipate transition to comfort care   Robinul 0.1mg  q6hr for secretions  Goals of Care and Additional Recommendations:  Limitations on Scope of Treatment: Full Scope Treatment  Code Status:  Limited code bipap and pressors ok  Prognosis:   Unable to determine likely <2 weeks if patient is transitioned to comfort care  Discharge Planning:  To Be Determined  Care plan was discussed with Dr. Karie Georges and patient's niece/HCPOA- Jesus Ramsey.   Thank you for allowing the Palliative Medicine Team to assist in the care of this patient.   Time In: 1300 Time Out: 1415 Total Time 75 mins Prolonged Time Billed yes      Greater than 50%  of this time was spent counseling and coordinating care related to the above assessment and plan.  Mariana Kaufman,  AGNP-C Palliative Medicine   Please contact Palliative Medicine Team phone at [phone] for questions and concerns.

## 2018-07-20 NOTE — Progress Notes (Signed)
Nutrition Brief Note  Patient identified on the Malnutrition Screening Tool (MST) Report.  Patient has ongoing severe malnutrition.  Chart reviewed. Patient is nearing end of life. Palliative Care team following and anticipate transition to comfort measures when family has a chance to discuss goals of care.  Currently NPO. No nutrition interventions indicated at this time. If nutrition issues arise, please consult RD.   Molli Barrows, RD, LDN, [place] Pager [phone] After Hours Pager [phone]

## 2018-07-20 NOTE — Progress Notes (Signed)
Jesus Ramsey  RAX:[id number] DOB: [birth year] DOA: 07/18/2018 PCP: [name], MD    LOS: 2 days   Reason for Consult / Chief Complaint:  Altered mental status and hypotension  Consulting MD and date:  Hospitalist.  HPI/Summary of hospital stay:  Jesus Ramsey is a 84 y.o. male who has a past medical history of Altered mental status, B12 deficiency (01/14/2012), B12 deficiency anemia, BPH (benign prostatic hypertrophy), Cataract, Chronic hyponatremia, Dysphagia, Hypertension, Mental retardation, Pneumonia, Psychogenic polydipsia, and Seizures ([place]). He had recent admission 07/10/18 through 07/15/18 for septic shock due to HCAP, hypoglycemia, hypothermia.  He was discharged back to nursing home where he has resided for the past 5 years due to his hx of cognitive developmental delay.  Admitted 07/18/18 with AMS (GCS 7), hypoglycemia, hypothermia, hypotension.  Per chart review, he apparently declined roughly 7 days after his discharge when he began to have bradycardia , hypoxiato , and decrease in mental status.  During early AM hours 9/10, he had recurrent hypotension that had minimal response to IVF's.  He was subsequently started on low dose neosynephrine and PCCM was therefore consulted for transfer to the ICU.  Per goals of care discussion with family and IM team, pt is a DNR / DNI; however, vasopressors are OK.  A palliative care consult has been placed for AM 9/10.  Subjective:  More responsive with some improvement in secretions.  Objective   Blood pressure (!) 142/90, pulse 81, temperature (!) 97.5 F (36.4 C), temperature source Axillary, resp. rate 18, height 5' 10.98" (1.803 m), weight 71.8 kg, SpO2 (!) 88 %.        Intake/Output Summary (Last 24 hours) at 07/20/2018 1547 Last data filed at 07/20/2018 1400 Gross per 24 hour  Intake 3976.72 ml  Output 2450 ml  Net 1526.72 ml   Filed Weights   07/18/18 2000 07/19/18 0500 07/20/18 0400  Weight: 69.5 kg 71.7 kg 71.8 kg     Examination: General: profound cachexia. HENT: ++ secretion pooling in oropharynx. Lungs: clear Cardiovascular: warm. HS normal Abdomen: soft. Extremities: muscle wasting. Edema. Neuro: Awake and following commands. GU: Catheter in place.  Consults: date of consult/date signed off & final recs:   Palliative care. Ongoing discussion regarding goals of care.  Procedures: N/A  Significant Diagnostic Tests: N/A  Micro Data: negative  Antimicrobials:  Vanc 9/9 >  Cefepime 9/9 >  Flagyl 9/9 >  Resolved Hospital Problem list     Assessment & Plan:   Constellation of hypotension, hypothermia, hypoglycemia in absence of signs of clear infection can be explained by malnutrition. Patient not able to eat and unclear if current state may reflect general progression of his condition. Further clarification of goals of care before initiating tube feeds.  Palliative Care NP has spoken with patient's HCP, his niece, who is leaning towards comfort care, but wishes to confer with her parents first.  Suspected sepsis.   Now off vasopressors. Cultures negative. Stop all antibiotics.  Hypoglycemia. Due to poor nutritional reserve.  Continue D10W until further clarification of goals of care.  Disposition / Summary of Today's Plan 07/20/18   Continue same care for now. Remains high aspiration risk.   Best Practice / Goals of Care / Disposition.   DVT prophylaxis: UFH GI prophylaxis: not required. Diet: NPO Mobility:bed-bound Code Status: DNR/DNI Family Communication: no family update today.  Labs   CBC: Recent Labs  Lab 07/18/18 0955 07/18/18 1031 07/19/18 0543  WBC 4.8  --  6.5  NEUTROABS 3.1  --   --   HGB 12.1* 12.6* 10.3*  HCT 38.2* 37.0* 32.0*  30.8*  MCV 91.0  --  90.4  PLT 111*  --  409*   Basic Metabolic Panel: Recent Labs  Lab 07/14/18 0512 07/15/18 0409 07/18/18 0955 07/18/18 1031 07/18/18 1931 07/19/18 0543  NA 132* 136 140 138 139 139  K 3.5  3.7 4.2 4.4 3.8 3.8  CL 96* 98 101 100 102 104  CO2 29 31 32  --  31 30  GLUCOSE 105* 110* 72 66* 63* 64*  BUN 8 10 7* 8 7* 7*  CREATININE 0.66 0.63 0.61 0.60* 0.65 0.87  CALCIUM 8.5* 8.8* 9.1  --  8.3* 8.0*  PHOS  --  2.3*  --   --   --   --    GFR: Estimated Creatinine Clearance: 63 mL/min (by C-G formula based on SCr of 0.87 mg/dL). Recent Labs  Lab 07/18/18 0955 07/18/18 1032 07/19/18 0543  WBC 4.8  --  6.5  LATICACIDVEN  --  1.19  --    Liver Function Tests: Recent Labs  Lab 07/14/18 0512 07/18/18 0955 07/19/18 0543  AST 48* 65* 47*  ALT 65* 69* 53*  ALKPHOS 193* 206* 153*  BILITOT 0.7 0.4 0.5  PROT 6.2* 6.8 5.7*  ALBUMIN 2.2* 2.4* 2.0*   No results for input(s): LIPASE, AMYLASE in the last 168 hours. No results for input(s): AMMONIA in the last 168 hours. ABG    Component Value Date/Time   PHART 7.401 07/10/2018 2037   PCO2ART 40.1 07/10/2018 2037   PO2ART 87.0 07/10/2018 2037   HCO3 25.7 07/10/2018 2037   TCO2 33 (H) 07/18/2018 1031   O2SAT 98.0 07/10/2018 2037    Coagulation Profile: No results for input(s): INR, PROTIME in the last 168 hours. Cardiac Enzymes: No results for input(s): CKTOTAL, CKMB, CKMBINDEX, TROPONINI in the last 168 hours. HbA1C: Hgb A1c MFr Bld  Date/Time Value Ref Range Status  01/14/2012 12:05 AM 6.0 (H) <5.7 % Final    Comment:    (NOTE)                                                                       According to the ADA Clinical Practice Recommendations for 2011, when HbA1c is used as a screening test:  >=6.5%   Diagnostic of Diabetes Mellitus           (if abnormal result is confirmed) 5.7-6.4%   Increased risk of developing Diabetes Mellitus References:Diagnosis and Classification of Diabetes Mellitus,Diabetes BDZH,2992,42(ASTMH 1):S62-S69 and Standards of Medical Care in         Diabetes - 2011,Diabetes DQQI,2979,89 (Suppl 1):S11-S61.   CBG: Recent Labs  Lab 07/20/18 0354 07/20/18 0733 07/20/18 1030  07/20/18 1144 07/20/18 1518  GLUCAP 139* 153* 117* 118* [place], MD [facility] ICU Physician [place]  Pager: [phone] Mobile: [phone] After hours: [phone].

## 2018-07-21 DIAGNOSIS — T68XXXA Hypothermia, initial encounter: Secondary | ICD-10-CM

## 2018-07-21 DIAGNOSIS — Z7189 Other specified counseling: Secondary | ICD-10-CM

## 2018-07-21 DIAGNOSIS — R1319 Other dysphagia: Secondary | ICD-10-CM

## 2018-07-21 LAB — PHOSPHORUS
PHOSPHORUS: 2.6 mg/dL (ref 2.5–4.6)
Phosphorus: 2.1 mg/dL — ABNORMAL LOW (ref 2.5–4.6)

## 2018-07-21 LAB — GLUCOSE, CAPILLARY
Glucose-Capillary: 105 mg/dL — ABNORMAL HIGH (ref 70–99)
Glucose-Capillary: 135 mg/dL — ABNORMAL HIGH (ref 70–99)
Glucose-Capillary: 144 mg/dL — ABNORMAL HIGH (ref 70–99)
Glucose-Capillary: 148 mg/dL — ABNORMAL HIGH (ref 70–99)
Glucose-Capillary: 152 mg/dL — ABNORMAL HIGH (ref 70–99)
Glucose-Capillary: 179 mg/dL — ABNORMAL HIGH (ref 70–99)

## 2018-07-21 LAB — MAGNESIUM
MAGNESIUM: 1.5 mg/dL — AB (ref 1.7–2.4)
MAGNESIUM: 1.5 mg/dL — AB (ref 1.7–2.4)

## 2018-07-21 MED ORDER — VITAL HIGH PROTEIN PO LIQD: 1000.0000 mL | ORAL | Status: DC

## 2018-07-21 MED ORDER — PRO-STAT SUGAR FREE PO LIQD: 30.0000 mL | Freq: Two times a day (BID) | ORAL | Status: DC

## 2018-07-21 MED ORDER — HYDROCORTISONE NA SUCCINATE PF 100 MG IJ SOLR
50.0000 mg | Freq: Two times a day (BID) | INTRAMUSCULAR | Status: AC
  Administered 2018-07-21 – 2018-07-22 (×3): 50 mg via INTRAVENOUS
  Filled 2018-07-21 (×3): qty 2

## 2018-07-21 MED ORDER — SODIUM CHLORIDE 0.9 % IV SOLN
2.0000 g | Freq: Two times a day (BID) | INTRAVENOUS | Status: AC
  Administered 2018-07-21 – 2018-07-23 (×6): 2 g via INTRAVENOUS
  Filled 2018-07-21 (×7): qty 2

## 2018-07-21 MED ORDER — ATROPINE SULFATE 1 MG/10ML IJ SOSY
PREFILLED_SYRINGE | INTRAMUSCULAR | Status: AC
  Filled 2018-07-21: qty 10

## 2018-07-21 MED ORDER — JEVITY 1.2 CAL PO LIQD
1000.0000 mL | ORAL | Status: DC
  Filled 2018-07-21 (×4): qty 1000

## 2018-07-21 NOTE — Progress Notes (Signed)
Jesus Ramsey  BCW:[id number] DOB: 23-Nov-1930 DOA: 07/18/2018 PCP: [name], MD    LOS: 3 days   Reason for Consult / Chief Complaint:  Confusion  Consulting MD and date:  Hospitalist  HPI/Summary of hospital stay:  83 y/o male with a lengthy past medical history was admitted on 9/9 with acute encephalopathy, shock.   At baseline he has mental regardation, seizures.    Subjective:  Some brady overnight Has low blood sugars Remains on D10 high dose Have not been able to contact his POA Palliative consulted  Objective   Blood pressure 133/86, pulse (!) 58, temperature (!) 96.1 F (35.6 C), temperature source Axillary, resp. rate 14, height 5' 10.98" (1.803 m), weight 71.8 kg, SpO2 90 %.        Intake/Output Summary (Last 24 hours) at 07/21/2018 1041 Last data filed at 07/21/2018 0800 Gross per 24 hour  Intake 3257.55 ml  Output 2370 ml  Net 887.55 ml   Filed Weights   07/18/18 2000 07/19/18 0500 07/20/18 0400  Weight: 69.5 kg 71.7 kg 71.8 kg    Examination: General:  Resting comfortably in bed, coughing HENT: NCAT OP clear PULM: CTA B, normal effort CV: RRR, no mgr GI: BS+, soft, nontender MSK: normal bulk and tone Neuro: awake, doesn't follow commands for me   Consults: date of consult/date signed off & final recs:     Procedures: none  Significant Diagnostic Tests: 9/3 CT head > very suboptimal study with motion artifact, some changes of atrophy 9/9 CT chest > multilobar pneumonia with small to moderate effusions, aortic atherosclerosis, calcificaion on aortic valve  Micro Data: 9/9 blood >  9/9 urine >   Antimicrobials:  9/9 vanc >  9/9 cefepime>  9/9 flagyl>   Resolved Hospital Problem list    Assessment & Plan:   Hypoglycemia >  Continue D10 for now, can stop when off tube feeding  Sepsis due to HCAP/Aspiraiton pneumonia  > renew cefepime for another 3 days then stop > wean off of hydrocortisone  Severe Malnutrition>    Place cortrak Start tube feeding  Goals of care > continue to try to reach family > appreciate family  Asymptomatic bradycardia  I think that in the long run we need to focus on comfort/hospice and palliation only.  Advanced life support measures would provide no medical benefit considering his advanced deconditioned state and his multiple recent episodes of severe critical illness. I do not think that a PEG tube is a good option long term as I think his poor functional status is due to an advanced dementia picture.  In the short run will start enteral nutrition here until we can discuss this with his family.  Disposition / Summary of Today's Plan 07/21/18   Floor, IMTS     DVT prophylaxis: lovenox GI prophylaxis: n/a Diet: NPO Mobility: bed rest Code Status: partial Family Communication: have not been able to reach them  Labs   CBC: Recent Labs  Lab 07/18/18 0955 07/18/18 1031 07/19/18 0543  WBC 4.8  --  6.5  NEUTROABS 3.1  --   --   HGB 12.1* 12.6* 10.3*  HCT 38.2* 37.0* 32.0*  30.8*  MCV 91.0  --  90.4  PLT 111*  --  038*   Basic Metabolic Panel: Recent Labs  Lab 07/15/18 0409 07/18/18 0955 07/18/18 1031 07/18/18 1931 07/19/18 0543  NA 136 140 138 139 139  K 3.7 4.2 4.4 3.8 3.8  CL 98 101 100  102 104  CO2 31 32  --  31 30  GLUCOSE 110* 72 66* 63* 64*  BUN 10 7* 8 7* 7*  CREATININE 0.63 0.61 0.60* 0.65 0.87  CALCIUM 8.8* 9.1  --  8.3* 8.0*  PHOS 2.3*  --   --   --   --    GFR: Estimated Creatinine Clearance: 63 mL/min (by C-G formula based on SCr of 0.87 mg/dL). Recent Labs  Lab 07/18/18 0955 07/18/18 1032 07/19/18 0543  WBC 4.8  --  6.5  LATICACIDVEN  --  1.19  --    Liver Function Tests: Recent Labs  Lab 07/18/18 0955 07/19/18 0543  AST 65* 47*  ALT 69* 53*  ALKPHOS 206* 153*  BILITOT 0.4 0.5  PROT 6.8 5.7*  ALBUMIN 2.4* 2.0*   No results for input(s): LIPASE, AMYLASE in the last 168 hours. No results for input(s): AMMONIA in the  last 168 hours. ABG    Component Value Date/Time   PHART 7.401 07/10/2018 2037   PCO2ART 40.1 07/10/2018 2037   PO2ART 87.0 07/10/2018 2037   HCO3 25.7 07/10/2018 2037   TCO2 33 (H) 07/18/2018 1031   O2SAT 98.0 07/10/2018 2037    Coagulation Profile: No results for input(s): INR, PROTIME in the last 168 hours. Cardiac Enzymes: No results for input(s): CKTOTAL, CKMB, CKMBINDEX, TROPONINI in the last 168 hours. HbA1C: Hgb A1c MFr Bld  Date/Time Value Ref Range Status  01/14/2012 12:05 AM 6.0 (H) <5.7 % Final    Comment:    (NOTE)                                                                       According to the ADA Clinical Practice Recommendations for 2011, when HbA1c is used as a screening test:  >=6.5%   Diagnostic of Diabetes Mellitus           (if abnormal result is confirmed) 5.7-6.4%   Increased risk of developing Diabetes Mellitus References:Diagnosis and Classification of Diabetes Mellitus,Diabetes QAST,4196,22(WLNLG 1):S62-S69 and Standards of Medical Care in         Diabetes - 2011,Diabetes XQJJ,9417,40 (Suppl 1):S11-S61.   CBG: Recent Labs  Lab 07/20/18 1518 07/20/18 1927 07/20/18 2329 07/21/18 0354 07/21/18 0719  GLUCAP 133* 105* 142* 148* 179*     Review of Systems:    Past medical history  He,  has a past medical history of Altered mental status, B12 deficiency (01/14/2012), B12 deficiency anemia, BPH (benign prostatic hypertrophy), Cataract, Chronic hyponatremia, Dysphagia, Hypertension, Mental retardation, Pneumonia, Psychogenic polydipsia, and Seizures ([place]).   Surgical History    Past Surgical History:  Procedure Laterality Date  . NO PAST SURGERIES       Social History   Social History   Socioeconomic History  . Marital status: Single    Spouse name: Not on file  . Number of children: Not on file  . Years of education: Not on file  . Highest education level: Not on file  Occupational History  . Not on file  Social Needs  .  Financial resource strain: Not on file  . Food insecurity:    Worry: Not on file    Inability: Not on file  . Transportation needs:    Medical:  Not on file    Non-medical: Not on file  Tobacco Use  . Smoking status: Former Research scientist (life sciences)  . Smokeless tobacco: Never Used  Substance and Sexual Activity  . Alcohol use: No  . Drug use: No  . Sexual activity: Not on file  Lifestyle  . Physical activity:    Days per week: Not on file    Minutes per session: Not on file  . Stress: Not on file  Relationships  . Social connections:    Talks on phone: Not on file    Gets together: Not on file    Attends religious service: Not on file    Active member of club or organization: Not on file    Attends meetings of clubs or organizations: Not on file    Relationship status: Not on file  . Intimate partner violence:    Fear of current or ex partner: Not on file    Emotionally abused: Not on file    Physically abused: Not on file    Forced sexual activity: Not on file  Other Topics Concern  . Not on file  Social History Narrative  . Not on file  ,  reports that he has quit smoking. He has never used smokeless tobacco. He reports that he does not drink alcohol or use drugs.   Family history   His Family history is unknown by patient.   Allergies No Known Allergies  Home meds  Prior to Admission medications   Medication Sig Start Date End Date Taking? Authorizing Provider  acetaminophen (TYLENOL) 325 MG tablet Take 650 mg by mouth every 6 (six) hours as needed. pain   Yes [provider]  aspirin 81 MG chewable tablet Chew 81 mg by mouth daily.   Yes [provider]  hydrochlorothiazide (HYDRODIURIL) 12.5 MG tablet Take 12.5 mg by mouth every morning. 06/29/18  Yes [provider]  latanoprost (XALATAN) 0.005 % ophthalmic solution Place 1 drop into both eyes at bedtime.   Yes [provider]  levothyroxine (SYNTHROID, LEVOTHROID) 25 MCG tablet Take 25 mcg by  mouth every morning. 07/06/18  Yes [provider]  magnesium hydroxide (MILK OF MAGNESIA) 400 MG/5ML suspension Take 30 mLs by mouth daily as needed. For constipation   Yes [provider]  Metoprolol Tartrate 75 MG TABS Take 75 mg by mouth 2 (two) times daily. Hold if SBP is <110 or HR <60   Yes [provider]  sodium chloride 1 G tablet Take 1 g by mouth every morning.   Yes [provider]  Tamsulosin HCl (FLOMAX) 0.4 MG CAPS Take 0.4 mg by mouth daily.    Yes [provider]  timolol (TIMOPTIC) 0.5 % ophthalmic solution Place 1 drop into both eyes every morning. 05/23/18  Yes [provider]      Roselie Awkward, MD [facility] Pager: [phone] Cell: [phone] After 3pm or if no response, call [phone]

## 2018-07-21 NOTE — Plan of Care (Signed)
IMTS to resume care on 07/22/2018 @ 0700 hours. We greatly appreciate the assistance and treatment provided by the pulmonary and critical care service while in the ICU.  Kathi Ludwig, MD Internal Medicine PGY-2 Pager # [phone]

## 2018-07-21 NOTE — Progress Notes (Addendum)
Initial Nutrition Assessment  DOCUMENTATION CODES:   Severe malnutrition in context of chronic illness  INTERVENTION:   Begin TF after Cortrak tube is placed tomorrow:   Jevity 1.2 at 25 ml/h increase by 10 ml every 6 hours to goal rate of 65 ml/h (1560 ml per day).   Provides 1872 kcal, 87 gm protein, 1264 ml free water daily.   Free water flushes 200 ml QID for total free water intake of 2064 ml per day   Monitor magnesium, potassium, and phosphorus daily for at least 3 days, MD to replete as needed, as pt is at risk for refeeding syndrome given severe PCM.  NUTRITION DIAGNOSIS:   Severe Malnutrition related to chronic illness(dysphagia) as evidenced by severe fat depletion, severe muscle depletion.  GOAL:   Patient will meet greater than or equal to 90% of their needs  MONITOR:   Labs, TF tolerance, Skin, I & O's  REASON FOR ASSESSMENT:   Consult Enteral/tube feeding initiation and management  ASSESSMENT:   84 yo male with PMH of delayed cognitive development, HTN, seizures, B-12 deficiency, chronic hyponatremia, and dysphagia who was admitted on 9/9 with acute encephalopathy & shock.   Patient unable to provide any nutrition hx. Discussed patient with RN today. Received MD Consult for TF initiation and management. Plans for Cortrak tube placement tomorrow.   Labs reviewed. Magnesium 1.5 (L) CBG's: [phone] Medications reviewed and include Solu-cortef. IVF: D10 at 130 ml/h  NUTRITION - FOCUSED PHYSICAL EXAM:    Most Recent Value  Orbital Region  Severe depletion  Upper Arm Region  Severe depletion  Thoracic and Lumbar Region  Moderate depletion  Buccal Region  Moderate depletion  Temple Region  Severe depletion  Clavicle Bone Region  Severe depletion  Clavicle and Acromion Bone Region  Severe depletion  Scapular Bone Region  Severe depletion  Dorsal Hand  Unable to assess  Patellar Region  Mild depletion  Anterior Thigh Region  Mild depletion   Posterior Calf Region  Mild depletion  Edema (RD Assessment)  Moderate  Hair  Reviewed  Eyes  Reviewed  Mouth  Reviewed  Skin  Reviewed  Nails  Unable to assess       Diet Order:   Diet Order            Diet NPO time specified  Diet effective now              EDUCATION NEEDS:   No education needs have been identified at this time  Skin:  Skin Integrity Issues:: Other (Comment) Other: MASD to buttocks; skin tear to L knee  Last BM:  9/9 (type 6)  Height:   Ht Readings from Last 1 Encounters:  07/18/18 5' 10.98" (1.803 m)    Weight:   Wt Readings from Last 1 Encounters:  07/20/18 71.8 kg    Ideal Body Weight:  78.2 kg  BMI:  Body mass index is 22.09 kg/m.  Estimated Nutritional Needs:   Kcal:  1800-2000  Protein:  85-100 gm  Fluid:  >/= 1.8 L    Molli Barrows, RD, LDN, [place] Pager [phone] After Hours Pager [phone]

## 2018-07-22 DIAGNOSIS — Z66 Do not resuscitate: Secondary | ICD-10-CM

## 2018-07-22 DIAGNOSIS — J918 Pleural effusion in other conditions classified elsewhere: Secondary | ICD-10-CM

## 2018-07-22 DIAGNOSIS — I10 Essential (primary) hypertension: Secondary | ICD-10-CM

## 2018-07-22 DIAGNOSIS — I959 Hypotension, unspecified: Secondary | ICD-10-CM

## 2018-07-22 DIAGNOSIS — R4182 Altered mental status, unspecified: Secondary | ICD-10-CM

## 2018-07-22 DIAGNOSIS — J690 Pneumonitis due to inhalation of food and vomit: Secondary | ICD-10-CM

## 2018-07-22 DIAGNOSIS — E039 Hypothyroidism, unspecified: Secondary | ICD-10-CM

## 2018-07-22 DIAGNOSIS — R680 Hypothermia, not associated with low environmental temperature: Secondary | ICD-10-CM

## 2018-07-22 DIAGNOSIS — N400 Enlarged prostate without lower urinary tract symptoms: Secondary | ICD-10-CM

## 2018-07-22 DIAGNOSIS — Z79890 Hormone replacement therapy (postmenopausal): Secondary | ICD-10-CM

## 2018-07-22 LAB — GLUCOSE, CAPILLARY
GLUCOSE-CAPILLARY: 106 mg/dL — AB (ref 70–99)
Glucose-Capillary: 101 mg/dL — ABNORMAL HIGH (ref 70–99)
Glucose-Capillary: 128 mg/dL — ABNORMAL HIGH (ref 70–99)
Glucose-Capillary: 137 mg/dL — ABNORMAL HIGH (ref 70–99)
Glucose-Capillary: 145 mg/dL — ABNORMAL HIGH (ref 70–99)
Glucose-Capillary: 97 mg/dL (ref 70–99)

## 2018-07-22 LAB — PHOSPHORUS: PHOSPHORUS: 1.7 mg/dL — AB (ref 2.5–4.6)

## 2018-07-22 LAB — MAGNESIUM: Magnesium: 1.5 mg/dL — ABNORMAL LOW (ref 1.7–2.4)

## 2018-07-22 MED ORDER — MAGNESIUM SULFATE 2 GM/50ML IV SOLN
2.0000 g | Freq: Once | INTRAVENOUS | Status: AC
  Administered 2018-07-22: 2 g via INTRAVENOUS
  Filled 2018-07-22: qty 50

## 2018-07-22 MED ORDER — MAGNESIUM SULFATE 50 % IJ SOLN: 1.0000 g | Freq: Once | INTRAMUSCULAR | Status: DC

## 2018-07-22 MED ORDER — SODIUM PHOSPHATES 45 MMOLE/15ML IV SOLN
10.0000 mmol | Freq: Once | INTRAVENOUS | Status: AC
  Administered 2018-07-22: 10 mmol via INTRAVENOUS
  Filled 2018-07-22: qty 3.33

## 2018-07-22 NOTE — Progress Notes (Signed)
Palliative Note:  NO CHARGE  I have assessed patient on this morning. I was able to contact his niece, Patric Buckhalter, who informed me that she is preparing to come up and visit patient shortly. I discussed with her the need for goals of care follow-up as I know she has spoken with my colleague, Mariana Kaufman, NP on 9/11. Niece verbalized understanding and has requested to meet at 1115 on today to discuss in details goals of care for patient.   At this time the request is to please delay placement of Coretrak until goals of care are more definitive. Niece verbalizes she and family had discussion last night and they were strongly considering hospice and comfort measures only, however, she would like to visit patient today prior to discussion to make final decision. She states staff last night and family members reported yesterday afternoon patient was alert and able to engage in discussions, which has made her re-evaluate goals, however she would like to come and see him face to face before making decision. She verbalized awareness of dementia process and changes in status day to day.   Detailed note and recommendations to follow Ruth discussion today at 1115.   Thank you for allowing [place] to assist with the care of Jesus Ramsey.   Alda Lea, AGPCNP-BC Palliative Medicine Team  Phone: [phone] Fax: [phone] Pager: [phone] Amion: Delane Ginger. Cousar

## 2018-07-22 NOTE — Progress Notes (Signed)
Patient has been nonverbal all day. Will move hands and react to oral care and painful stimulus. Palliative care meet with family and changed status to complete DNR. Also will hold off on Coretrak placement for now. Treatment team and Dr. Lynnae January aware of patient's heart rate, bear hugger placement and lab results. Head of bed at 30 degrees, side rails up X2, bed in the lowest position, call bell within reach, mittens in place, bath given, oral care provided and vital signs stable for patient. Will continue to monitor.

## 2018-07-22 NOTE — Progress Notes (Signed)
  Date: 07/22/2018  Patient name: Jesus Ramsey  Medical record number: [id number]  Date of birth: 01/11/1931   I have seen and evaluated this patient and I have discussed the plan of care with the house staff. Please see their note for complete details. I concur with their findings with the following additions/corrections: [name] was seen on morning rounds with the team.  He was unresponsive today.  Breath sounds are coarse throughout and he is bradycardic.  He remains on the bear hugger and D10 at 130 cc/h to maintain euthermia and euglycemia.  We will hold off on parenteral feedings until goals of care have been better clarified with palliative care as any enteral feeding can increase aspiration risk. Antibiotics were stopped yesterday as all culture results have been negative but antibiotics were renewed today for an additional 3 days for aspiration pneumonia concern. Prognosis is poor as he is not able to maintain homeostasis without medical intervention.  Await final goals of care discussion.  Bartholomew Crews, MD 07/22/2018, 1:56 PM

## 2018-07-22 NOTE — Progress Notes (Signed)
CSW aware that plan is for pt to began tube feeds tomorrow once cortrak has been placed. CSW will remain involved for further needs at this time.    Virgie Dad Ragna Kramlich, MSW, [facility] Clinical Social Worker [phone]

## 2018-07-22 NOTE — Progress Notes (Signed)
   Subjective:  Jesus Ramsey is a 84 y.o. with PMH of dysphagia, bph, htn, hypothyroidism admit for hypothermia on hospital day 4  Jesus Ramsey was examined and evaluated at bedside this a.m.  He is minimally responsive to sternal rub.  Unable to assess mental status.  Chart review with palliative shows that patient's family is considering full comfort care. His niece is planning to arrive sometime this morning to discuss goals of care. Spoke with nurse about delaying the tube feeds until goals of cares have been discussed.  Objective:  Vital signs in last 24 hours: Vitals:   07/22/18 1300 07/22/18 1400 07/22/18 1506 07/22/18 1600  BP: (!) 154/79 (!) 143/74  (!) 151/90  Pulse: 60 (!) 54  65  Resp: 15 15  17   Temp:   (!) 96.6 F (35.9 C)   TempSrc:   Axillary   SpO2: 100% 100%  100%  Weight:      Height:       Physical Exam  Constitutional: He is well-developed, well-nourished, and in no distress. No distress.  Cachetic appearing  HENT:  Mouth/Throat: Oropharynx is clear and moist. No oropharyngeal exudate.  Neck: Normal range of motion. Neck supple.  Cardiovascular: Normal rate, regular rhythm and intact distal pulses.  Distant heart sounds  Pulmonary/Chest: Effort normal and breath sounds normal.  Abdominal: Soft. Bowel sounds are normal.  Musculoskeletal: Normal range of motion. He exhibits edema (non-pitting bilateral edema).  Neurological:  GCS 5  Skin: Skin is warm and dry. He is not diaphoretic.   Assessment/Plan: Jesus Ramsey is a 84 year old male with past medical history of dysphasia hypothyroidism and BPH presenting with hypothermia he was hypothermic hypoglycemic and high both tensive requiring pressors and was admitted to ICU currently off pressors for 2 days and temperature stable on bear hugger and glucose stable at the 103cc/h and was deemed appropriate to transfer back to general internal medicine.  Goals of care are in the process of being clarified with family per  palliative currently holding off on tube feeding until family arrives and confirms.  Not quite comfort care but most likely will be soon.  Altered mental status 2/2 hypothermia, hypotension, hypoglycemia Work up during prior admission negative for adrenal insuffiencey or insulinoma or hypothyroidism. Constellation of symptoms may be 2/2 malnutrition. BG this AM 145 on D10 130cc/hr. Mentation significantly declined from last discharge. GCS 5 - C/w D10 130cc/hr - Appreciate palliative recs - Glucose checks  Aspiration pneumonia CT chest on 07/18/18 confirm multilobar bronchopneumonia w/ moderate pleural effusions. Blood culture negative. Abx therapy (vanc, flagyl, cefepime) stopped on 9/11 after 2 days. Cefepime restarted on 9/12 per PCCM - C/w Cefepime 2g q12hr for 2 more days - F/u final blood and urine culture  Hypothyroidism - C/w Levo 12.80mcg  Diet:NPO til speech and swallow DVT prophx: Lovenox Bowel: Milk of Magnesia, Senokot Code:Changed to DNR today  Dispo: Anticipated discharge in approximately 2 day(s).   [name], MD 07/22/2018, 4:41 PM Pager: [phone]

## 2018-07-22 NOTE — Progress Notes (Signed)
Daily Progress Note   Patient Name: Jesus Ramsey       Date: 07/22/2018 DOB: 1931/09/22  Age: 84 y.o. MRN#: [id number] Attending Physician: Bartholomew Crews, MD Primary Care Physician: [name], MD Admit Date: 07/18/2018  Reason for Consultation/Follow-up: Establishing goals of care  Subjective: Patient is lying in bed. He is lethargic with minimum response. Open eyes with loud call of his name, only to close them. He is bradycardic and is also on the bear hugger. Continues on D10.   Met with his niece/POA, Ireland Chagnon to discuss goals of care. My Palliative colleagues Trinidad Curet, NP and Leafy Half, NP have been in discussion with her in regards to Mr. Back's current illness and goals. She reports that she lives in [place] and requested to meet with our team today. Niece states she has discussed his condition with other family members and they feel that he continues to show signs of decline, however, she is now questioning if they are giving up on him to quickly given yesterday patient was more alert and would follow commands. She has a video/picture on her phone which she reports family took on yesterday afternoon and patient was awake and smiling. Explained to Ms. Watkin that patient may have some episodes of alertness however, his condition remains poor given he is unable to maintain nutritional status and is severely protein calorie malnutrition, bradycardic, and unable to maintain his body temperature and thermoregulate which is why is currently on the bear hugger. She verbalized understanding and as tearful. She states she just feels she has to give him at least another day to show that he is not going to get better. When asked what signs would she like to see that would allow her to know he is  getting better and she stated for him to wake up and interact with her. She did verbalized that she is aware that he is unable to maintain his nutritional status and knows this will continue to be a vicious cycle if he was to continue on. We discussed placement of a [place] for nutritional support and she has expressed that she would not want to have this place as it was to aggressive and she only wanted to see if he was going to be able to do anything on his own without intervention.  Advised that this would not be realistic based on his current state. We discussed the criticalness of his current state and his current Partial Code status. After discussing in detail and recognizing that ultimately Ms. Voiles's goals and her family's goals were for him to be kept comfortable she would like his code status changed to DNR/DNI. She also wanted to review the MOST form which she had a copy of and to complete. MOST form was completed with DNR, comfort measures, no IV fluids, and no PEG tube identified by her. She was given a copy of the document.  I explained the difference in aggressive measures compared to comfort measures in relation to her expressed goals of comfort for the patient. She verbalized understanding. Niece states if patient does not show signs of improvement within 24 hrs her goal is for him to be placed at residential hospice facility or to return to [facility] facility (she reports she has spoken with them) with hospice services and comfort care. At this time she is requesting that we continue to treat as we are without escalation of care. Advised their was a strong possibility that patient could pass away here at the hospital. We discussed his bradycardia also. She verbalized understanding and reports if he is unstable to transfer out she and family are prepared for comfort care here in the hospital.    Length of Stay: 4  Current Medications: Scheduled Meds:  . chlorhexidine  15 mL Mouth Rinse BID    . enoxaparin (LOVENOX) injection  40 mg Subcutaneous Q24H  . hydrocortisone sod succinate (SOLU-CORTEF) inj  50 mg Intravenous Q12H  . levothyroxine  12.5 mcg Intravenous Daily  . mouth rinse  15 mL Mouth Rinse q12n4p  . scopolamine  1 patch Transdermal Q72H  . sodium chloride flush  3 mL Intravenous Q12H    Continuous Infusions: . sodium chloride 250 mL (07/22/18 0001)  . ceFEPime (MAXIPIME) IV 2 g (07/22/18 0934)  . dextrose 130 mL/hr at 07/22/18 0423  . feeding supplement (JEVITY 1.2 CAL)    . sodium chloride Stopped (07/19/18 0458)    PRN Meds: sodium chloride, acetaminophen **OR** acetaminophen, ondansetron **OR** ondansetron (ZOFRAN) IV, sodium chloride flush  Physical Exam  Constitutional: He appears cachectic. He has a sickly appearance.  Thin, frail, chronically ill appearing   Cardiovascular: An irregular rhythm present. Bradycardia present. Exam reveals decreased pulses.  Pulmonary/Chest: He has decreased breath sounds. He has rhonchi.  Neurological: He is unresponsive. He displays atrophy.  Psychiatric: Cognition and memory are impaired. He expresses inappropriate judgment. He is noncommunicative.  Nursing note and vitals reviewed.           Vital Signs: BP (!) 100/54   Pulse (!) 57   Temp (!) 97.1 F (36.2 C) (Axillary)   Resp 13   Ht 5' 10.98" (1.803 m)   Wt 71.5 kg   SpO2 94%   BMI 21.99 kg/m  SpO2: SpO2: 94 % O2 Device: O2 Device: Nasal Cannula O2 Flow Rate: O2 Flow Rate (L/min): 6 L/min  Intake/output summary:   Intake/Output Summary (Last 24 hours) at 07/22/2018 1105 Last data filed at 07/22/2018 0600 Gross per 24 hour  Intake 3254.84 ml  Output 1475 ml  Net 1779.84 ml   LBM: Last BM Date: 07/18/18 Baseline Weight: Weight: 69.5 kg Most recent weight: Weight: 71.5 kg       Palliative Assessment/Data: PPS 10%   Patient Active Problem List   Diagnosis Date Noted  . Congestion of upper airway   .  Advanced care planning/counseling discussion    . Goals of care, counseling/discussion   . Bradycardia   . Hypoglycemia   . Protein-calorie malnutrition, severe 07/14/2018  . Adult failure to thrive   . Acute metabolic encephalopathy   . Aspiration pneumonia due to gastric secretions ([place])   . Palliative care by specialist   . Altered mental status 07/10/2018  . Hypothermia 07/10/2018  . Severe sepsis with septic shock ([place])   . Hypothyroidism   . Community acquired pneumonia of left lower lobe of lung ([place])   . Acute cystitis without hematuria   . DNR (do not resuscitate) discussion   . Dysphagia 04/08/2015  . Seizures ([place]) 08/10/2014  . Essential hypertension 08/10/2014  . Absolute anemia 08/10/2014  . Weakness generalized 08/10/2014  . Psychogenic polydipsia 01/14/2012  . BPH (benign prostatic hyperplasia) 01/14/2012  . Mental retardation 01/13/2012  . Hyponatremia 01/13/2012    Palliative Care Assessment & Plan   Patient Profile: 84 y.o.maleadmitted on 9/9/2019w/ PMH of Hypothyroidism, BPH, HTN, and cognitive developmental delays presenting with altered mental status as well as hypoglycemia, hypothermia, and hypotension.And had a recent admission with similar medical conditions and was discharged4days ago back to his skilled nursing facility. Chest x-ray work-up significant for right sided pleural effusion and consolidation on the right. Patientis high risk for aspiration.  Assessment:  Continues to show no signs of improvement  Unresponsive  On bear hugger   Bradycardic  Continue with current care per [place], [place] for another 24 hrs. DOES NOT WANT CORETRAK PLACED.   Recommendations/Plan:  DNR/DNI-as requested by Milbert Coulter. MOST form completed   Continue to treat the treatable without escalation of care/aggressive measures. [name], states she does not want coretrak placed and would like to allow patient additional 24hrs to show signs of no improvement before she makes final decision to initiate  comfort care or discharge plans with hospice (if patient is stable). Discussed patient most likely would have hospital death if condition continues to decline as he may be unstable to transport out.   Goals of Care and Additional Recommendations:  Limitations on Scope of Treatment: Full Scope Treatment, No Artificial Feeding and No Diagnostics  Code Status:    Code Status Orders  (From admission, onward)         Start     Ordered   07/18/18 1257  Limited resuscitation (code)  Continuous    Question Answer Comment  In the event of cardiac or respiratory ARREST: Initiate Code Blue, Call Rapid Response Yes   In the event of cardiac or respiratory ARREST: Perform CPR No   In the event of cardiac or respiratory ARREST: Perform Intubation/Mechanical Ventilation No   In the event of cardiac or respiratory ARREST: Use NIPPV/BiPAp only if indicated Yes   In the event of cardiac or respiratory ARREST: Administer ACLS medications if indicated Yes   In the event of cardiac or respiratory ARREST: Perform Defibrillation or Cardioversion if indicated No      07/18/18 1301        Code Status History    Date Active Date Inactive Code Status Order ID Comments User Context   07/18/2018 1243 07/18/2018 1301 Partial Code [id number]  Neva Seat, MD ED   07/11/2018 1306 07/16/2018 0034 Partial Code [id number]  Knox Royalty, NP Inpatient  Advance Directive Documentation     Most Recent Value  Type of Advance Directive  Living will  Pre-existing out of facility DNR order (yellow form or pink MOST form)  -  "  MOST" Form in Place?  -     Prognosis:   < 2 weeks or less likely in the setting of bradycardia, unresponsive, no po intake, severe protein calorie malnutrition, unable to maintain homeostasis on bear hugger, hypoglycemic.   Discharge Planning:  To Be Determined (anticipate comfort measures with possible d/c with hospice or hospital death)   Care plan was discussed with bedside RN and message  to Dr. Lynnae January.   Thank you for allowing the Palliative Medicine Team to assist in the care of this patient.   Time In: 1130 Time Out: 1245 Total Time 75 min.  Prolonged Time Billed YES       Greater than 50%  of this time was spent counseling and coordinating care related to the above assessment and plan.  Alda Lea, AGPCNP-BC Palliative Medicine Team  Phone: [phone] Fax: [phone] Pager: [phone] Amion: Bjorn Pippin   Please contact Palliative Medicine Team phone at [phone] for questions and concerns.

## 2018-07-22 NOTE — Plan of Care (Signed)
  Problem: Education: Goal: Knowledge of General Education information will improve Description Including pain rating scale, medication(s)/side effects and non-pharmacologic comfort measures Outcome: Progressing   

## 2018-07-23 DIAGNOSIS — Z79899 Other long term (current) drug therapy: Secondary | ICD-10-CM

## 2018-07-23 DIAGNOSIS — Z8701 Personal history of pneumonia (recurrent): Secondary | ICD-10-CM

## 2018-07-23 DIAGNOSIS — R471 Dysarthria and anarthria: Secondary | ICD-10-CM

## 2018-07-23 LAB — CULTURE, BLOOD (ROUTINE X 2)
Culture: NO GROWTH
Culture: NO GROWTH

## 2018-07-23 LAB — BASIC METABOLIC PANEL
Anion gap: 5 (ref 5–15)
BUN: 5 mg/dL — AB (ref 8–23)
CALCIUM: 8.6 mg/dL — AB (ref 8.9–10.3)
CO2: 33 mmol/L — ABNORMAL HIGH (ref 22–32)
CREATININE: 0.64 mg/dL (ref 0.61–1.24)
Chloride: 99 mmol/L (ref 98–111)
GFR calc Af Amer: >60 mL/min (ref >60)
GFR calc non Af Amer: >60 mL/min (ref >60)
Glucose, Bld: 104 mg/dL — ABNORMAL HIGH (ref 70–99)
Potassium: 3.3 mmol/L — ABNORMAL LOW (ref 3.5–5.1)
SODIUM: 137 mmol/L (ref 135–145)

## 2018-07-23 LAB — GLUCOSE, CAPILLARY
Glucose-Capillary: 105 mg/dL — ABNORMAL HIGH (ref 70–99)
Glucose-Capillary: 108 mg/dL — ABNORMAL HIGH (ref 70–99)
Glucose-Capillary: 113 mg/dL — ABNORMAL HIGH (ref 70–99)
Glucose-Capillary: 69 mg/dL — ABNORMAL LOW (ref 70–99)
Glucose-Capillary: 77 mg/dL (ref 70–99)
Glucose-Capillary: 91 mg/dL (ref 70–99)

## 2018-07-23 LAB — CBC
HCT: 32.1 % — ABNORMAL LOW (ref 39.0–52.0)
Hemoglobin: 10.2 g/dL — ABNORMAL LOW (ref 13.0–17.0)
MCH: 29 pg (ref 26.0–34.0)
MCHC: 31.8 g/dL (ref 30.0–36.0)
MCV: 91.2 fL (ref 78.0–100.0)
PLATELETS: 175 10*3/uL (ref 150–400)
RBC: 3.52 MIL/uL — ABNORMAL LOW (ref 4.22–5.81)
RDW: 14.3 % (ref 11.5–15.5)
WBC: 12.4 10*3/uL — ABNORMAL HIGH (ref 4.0–10.5)

## 2018-07-23 LAB — MAGNESIUM: Magnesium: 2 mg/dL (ref 1.7–2.4)

## 2018-07-23 LAB — PHOSPHORUS: Phosphorus: 2.1 mg/dL — ABNORMAL LOW (ref 2.5–4.6)

## 2018-07-23 MED ORDER — DEXTROSE 50 % IV SOLN: 50.0000 mL | Freq: Once | INTRAVENOUS | Status: DC

## 2018-07-23 MED ORDER — SODIUM PHOSPHATES 45 MMOLE/15ML IV SOLN
10.0000 mmol | Freq: Once | INTRAVENOUS | Status: AC
  Administered 2018-07-23: 10 mmol via INTRAVENOUS
  Filled 2018-07-23: qty 3.33

## 2018-07-23 MED ORDER — POTASSIUM CHLORIDE 10 MEQ/100ML IV SOLN
10.0000 meq | INTRAVENOUS | Status: AC
  Administered 2018-07-23 (×2): 10 meq via INTRAVENOUS
  Filled 2018-07-23 (×2): qty 100

## 2018-07-23 MED ORDER — HYDROCORTISONE NA SUCCINATE PF 100 MG IJ SOLR: 25.0000 mg | Freq: Two times a day (BID) | INTRAMUSCULAR | Status: DC

## 2018-07-23 MED ORDER — HYDROCORTISONE NA SUCCINATE PF 100 MG IJ SOLR
40.0000 mg | Freq: Two times a day (BID) | INTRAMUSCULAR | Status: DC
  Administered 2018-07-23: 40 mg via INTRAVENOUS
  Filled 2018-07-23: qty 2

## 2018-07-23 MED ORDER — DEXTROSE 50 % IV SOLN
INTRAVENOUS | Status: AC
  Administered 2018-07-23: 50 mL
  Filled 2018-07-23: qty 50

## 2018-07-23 NOTE — Evaluation (Signed)
Clinical/Bedside Swallow Evaluation Patient Details  Name: Jesus Ramsey MRN: [id number] Date of Birth: 07-22-1931  Today's Date: 07/23/2018 Time: SLP Start Time (ACUTE ONLY): 1340 SLP Stop Time (ACUTE ONLY): 1410 SLP Time Calculation (min) (ACUTE ONLY): 30 min  Past Medical History:  Past Medical History:  Diagnosis Date  . Altered mental status   . B12 deficiency 01/14/2012  . B12 deficiency anemia   . BPH (benign prostatic hypertrophy)   . Cataract   . Chronic hyponatremia    [name] 01/13/2012  . Dysphagia   . Hypertension   . Mental retardation   . Pneumonia   . Psychogenic polydipsia   . Seizures ([place])    Past Surgical History:  Past Surgical History:  Procedure Laterality Date  . NO PAST SURGERIES     HPI:  Patient is an 84 y.o. male with PMH: dysphagia, hypothyroidism, hypothermia, HTN, BPH, cognitive development delays, who presented from SNF to ED with AMS, hypoglycemia, hypothermia and hypotension. Patient with recent discharge from [facility] hospital (admitted 9/1-07/15/18) with AMS. At time of discharge on 9/6, SLP was recommending Dys 1(puree) solids, thin liquids with full supervision.   Assessment / Plan / Recommendation Clinical Impression  Patient presents with a moderate oropharyngeal dysphagia with suspected cognitive component. Patient was easily distracted and required fairly consistent verbal cues to "swallow" as he would hold boluses in mouth, look around room, try to talk to SLP, etc. Patient did achieve full clearance of oral cavity after swallows for puree solids, thin, nectar and honey thick liquids. Patient with suspected delayed swallow initiation but unable to determine if this is a cognitive or sensory cause. Patient with wet vocal quality at baseline and although he did exhibit delayed throat clearing and coughing with thin liquids, vocal quality was not significantly different from baseline. Patient did not exhibit overt s/s of aspiration with puree solids or with  nectar thick liquids. SLP spoke patient's MD Gilberto Better, MD) and as patient's recent AMS occured without warning, prognosis for patient is difficult. Patient's family plans to make decision regarding hospice care as well as possible PEG placement on Monday (07/25/18) pending his progress over this weekend. Patient would benefit from [facility] to r/o aspiration and to aid in decision making regarding PO safety.  SLP Visit Diagnosis: Dysphagia, unspecified (R13.10)    Aspiration Risk  Moderate aspiration risk;Severe aspiration risk    Diet Recommendation NPO        Other  Recommendations Oral Care Recommendations: Oral care BID   Follow up Recommendations Skilled Nursing facility      Frequency and Duration min 2x/week  2 weeks       Prognosis Prognosis for Safe Diet Advancement: Fair Barriers to Reach Goals: Severity of deficits;Cognitive deficits      Swallow Study   General Date of Onset: 07/18/18 HPI: Patient is an 84 y.o. male with PMH: dysphagia, hypothyroidism, hypothermia, HTN, BPH, cognitive development delays, who presented from SNF to ED with AMS, hypoglycemia, hypothermia and hypotension. Patient with recent discharge from [facility] hospital (admitted 9/1-07/15/18) with AMS. At time of discharge on 9/6, SLP was recommending Dys 1(puree) solids, thin liquids with full supervision. Type of Study: Bedside Swallow Evaluation Previous Swallow Assessment: see HPI Diet Prior to this Study: NPO Temperature Spikes Noted: No History of Recent Intubation: No Behavior/Cognition: Alert;Confused;Requires cueing;Distractible Oral Cavity Assessment: Within Functional Limits Oral Cavity - Dentition: Missing dentition(has only two bottom teeth) Self-Feeding Abilities: Total assist(patient with bilateral glove restraints) Patient Positioning: Upright in bed Baseline Vocal  Quality: Wet Volitional Cough: Weak Volitional Swallow: Able to elicit    Oral/Motor/Sensory Function Overall Oral Motor/Sensory  Function: Mild impairment Facial ROM: Within Functional Limits Facial Symmetry: Within Functional Limits Facial Strength: Within Functional Limits Lingual ROM: Reduced right;Reduced left Lingual Symmetry: Within Functional Limits Lingual Strength: Reduced Velum: Within Functional Limits Mandible: Within Functional Limits   Ice Chips Ice chips: Not tested   Thin Liquid Thin Liquid: Impaired Presentation: Cup;Spoon Oral Phase Impairments: Reduced labial seal;Poor awareness of bolus Oral Phase Functional Implications: Oral holding;Prolonged oral transit Pharyngeal  Phase Impairments: Suspected delayed Swallow;Wet Vocal Quality;Throat Clearing - Delayed;Cough - Delayed    Nectar Thick Nectar Thick Liquid: Impaired Presentation: Cup;Spoon Oral Phase Impairments: Poor awareness of bolus Oral phase functional implications: Prolonged oral transit;Oral holding Pharyngeal Phase Impairments: Suspected delayed Swallow;Wet Vocal Quality   Honey Thick Honey Thick Liquid: Impaired Presentation: Spoon Oral Phase Impairments: Poor awareness of bolus Oral Phase Functional Implications: Prolonged oral transit;Oral holding Pharyngeal Phase Impairments: Suspected delayed Swallow   Puree Puree: Impaired Presentation: Spoon Oral Phase Impairments: Poor awareness of bolus Oral Phase Functional Implications: Oral holding;Prolonged oral transit Pharyngeal Phase Impairments: Suspected delayed Swallow   Solid     Solid: Not tested      Sonia Baller, MA, CCC-SLP 07/23/18 3:01 PM

## 2018-07-23 NOTE — Progress Notes (Signed)
Attempted to call report to 5W X 1. Awaiting call back from charge RN.

## 2018-07-23 NOTE — Progress Notes (Signed)
  Date: 07/23/2018  Patient name: Jesus Ramsey  Medical record number: [id number]  Date of birth: Mar 12, 1931   I have seen and evaluated this patient and I have discussed the plan of care with the house staff. Please see their note for complete details. I concur with their findings with the following additions/corrections:   Slightly improved from yesterday, able to respond to questions with dysarthric speech.  Evaluated by SLP today, significant aspiration risk, will continue to evaluate day by day and work with family to determine best goals of care based on his wishes.  Lenice Pressman, M.D., Ph.D. 07/23/2018, 4:00 PM

## 2018-07-23 NOTE — Progress Notes (Signed)
NURSING PROGRESS NOTE  Jesus Ramsey [id number] Transfer Data: 07/23/2018 6:27 PM Attending Provider: Bartholomew Crews, MD HXT:[id number], Brandt Loosen, MD Code Status: DNR  Jesus Ramsey is a 84 y.o. male patient transferred from 20M -No acute distress noted.  -No complaints of shortness of breath.  -No complaints of chest pain.   Cardiac Monitoring: Not ordered   Blood pressure (!) 159/75, pulse (!) 49, temperature (!) 96.5 F (35.8 C), temperature source Axillary, resp. rate 13, height 5' 10.98" (1.803 m), weight 72.4 kg, SpO2 100 %.   IV Fluids:  IV in place, occlusive dsg intact without redness, IV cath  Allergies:  Patient has no known allergies.  Past Medical History:   has a past medical history of Altered mental status, B12 deficiency (01/14/2012), B12 deficiency anemia, BPH (benign prostatic hypertrophy), Cataract, Chronic hyponatremia, Dysphagia, Hypertension, Mental retardation, Pneumonia, Psychogenic polydipsia, and Seizures ([place]).  Past Surgical History:   has a past surgical history that includes No past surgeries.  Social History:   reports that he has quit smoking. He has never used smokeless tobacco. He reports that he does not drink alcohol or use drugs.  Skin: intact   Patient/Family orientated to room. Information packet given to patient/family. Admission inpatient armband information verified with patient/family to include name and date of birth and placed on patient arm. Side rails up x 2, fall assessment and education completed with patient/family. Patient/family able to verbalize understanding of risk associated with falls and verbalized understanding to call for assistance before getting out of bed. Call light within reach. Patient/family able to voice and demonstrate understanding of unit orientation instructions.    Will continue to evaluate and treat per MD orders.

## 2018-07-23 NOTE — Progress Notes (Signed)
Report given to 5W RN

## 2018-07-23 NOTE — Progress Notes (Addendum)
   Subjective:  Jesus Ramsey is a 84 y.o. with PMH of dysphagia, bph, htn, hypothyroidism admit for hypothermia on hospital day 5  Jesus Ramsey was examined and evaluated at bedside this a.m. he is awake and alert this morning.  He is able to state his last name and knows that he is in the hospital but unable to state his full name year or specific hospital.  He states that he is not in any pain and that he is comfortable.  Denies any headaches chest pain shortness of breath.  Objective:  Vital signs in last 24 hours: Vitals:   07/23/18 0900 07/23/18 1050 07/23/18 1100 07/23/18 1145  BP: 131/63 (!) 145/70 (!) 150/71   Pulse: (!) 48 60 (!) 46 (!) 48  Resp: 15 18 16 16   Temp:      TempSrc:      SpO2: (!) 88% 96% 98% 100%  Weight:      Height:       Physical Exam  Constitutional: No distress.  Cachetic appearing  HENT:  Head: Normocephalic and atraumatic.  Mouth/Throat: Oropharynx is clear and moist. No oropharyngeal exudate.  Eyes: Pupils are equal, round, and reactive to light. Conjunctivae and EOM are normal.  Glasses on  Neck: Normal range of motion. Neck supple.  Cardiovascular: Normal rate, regular rhythm and intact distal pulses.  Distant heart sounds  Pulmonary/Chest: Effort normal.  Abdominal: Soft. Bowel sounds are normal. He exhibits no distension. There is no tenderness. There is no guarding.  Musculoskeletal: Normal range of motion. He exhibits edema (non-pitting bilateral edema).  Neurological: He is alert. A cranial nerve deficit (Unable to follow directions) is present. GCS score is 15.  Oriented x1 to last name only  Skin: Skin is dry. He is not diaphoretic.  Lower extremities cool to touch. Off bair hugger   Assessment/Plan: Jesus Ramsey is a 84 year old male with past medical history of dysphasia hypothyroidism and BPH presenting with hypothermia.  Palliative on board h he was unresponsive yesterday and family had planned on moving him to full comfort care pending  his condition over the weekend.  His mentation appears improved, but difficult to predict if it will stay this way as his last decline in mentation was acute onset without clear precipitant.  Will monitor to see if he can stay stable. Currently doing supportive treatments.  Altered mental status 2/2 hypothermia, hypotension, hypoglycemia Mentation improved today. Will see if he can tolerate PO intake with speech and swallow eval. Temperature stable off bair hugger. Blood pressure stable. - C/w D10 130cc/hr - Speech and swallow eval - Appreciate palliative recs - Glucose checks  Pneumonia Final blood and urine cultures negative. Continue to finish treatment for pneumonia - C/w Cefepime 2g q12hr for 1 more day  Hypothyroidism - C/w Levo 12.59mcg  Diet:NPO til speech and swallow DVT prophx: Lovenox Bowel: Milk of Magnesia, Senokot Code:DNR  Dispo: Anticipated discharge in approximately 2 day(s).   [name], MD 07/23/2018, 12:09 PM Pager: [phone]

## 2018-07-24 ENCOUNTER — Inpatient Hospital Stay (HOSPITAL_COMMUNITY): Payer: Medicare Other

## 2018-07-24 DIAGNOSIS — Z960 Presence of urogenital implants: Secondary | ICD-10-CM

## 2018-07-24 LAB — PHOSPHORUS
Phosphorus: 1.7 mg/dL — ABNORMAL LOW (ref 2.5–4.6)
Phosphorus: 1.7 mg/dL — ABNORMAL LOW (ref 2.5–4.6)

## 2018-07-24 LAB — CBC
HEMATOCRIT: 35.8 % — AB (ref 39.0–52.0)
HEMOGLOBIN: 11.3 g/dL — AB (ref 13.0–17.0)
MCH: 28.8 pg (ref 26.0–34.0)
MCHC: 31.6 g/dL (ref 30.0–36.0)
MCV: 91.1 fL (ref 78.0–100.0)
Platelets: 121 10*3/uL — ABNORMAL LOW (ref 150–400)
RBC: 3.93 MIL/uL — AB (ref 4.22–5.81)
RDW: 14.2 % (ref 11.5–15.5)
WBC: 5.8 10*3/uL (ref 4.0–10.5)

## 2018-07-24 LAB — BASIC METABOLIC PANEL
Anion gap: 8 (ref 5–15)
CO2: 30 mmol/L (ref 22–32)
CREATININE: 0.56 mg/dL — AB (ref 0.61–1.24)
Calcium: 7.9 mg/dL — ABNORMAL LOW (ref 8.9–10.3)
Chloride: 99 mmol/L (ref 98–111)
GFR calc Af Amer: >60 mL/min (ref >60)
GFR calc non Af Amer: >60 mL/min (ref >60)
Glucose, Bld: 99 mg/dL (ref 70–99)
POTASSIUM: 3.2 mmol/L — AB (ref 3.5–5.1)
SODIUM: 137 mmol/L (ref 135–145)

## 2018-07-24 LAB — GLUCOSE, CAPILLARY
Glucose-Capillary: 83 mg/dL (ref 70–99)
Glucose-Capillary: 75 mg/dL (ref 70–99)

## 2018-07-24 MED ORDER — MORPHINE SULFATE (CONCENTRATE) 10 MG/0.5ML PO SOLN: 5.0000 mg | ORAL | Status: DC | PRN

## 2018-07-24 MED ORDER — SODIUM PHOSPHATES 45 MMOLE/15ML IV SOLN
10.0000 mmol | Freq: Once | INTRAVENOUS | Status: AC
  Administered 2018-07-24: 10 mmol via INTRAVENOUS
  Filled 2018-07-24: qty 3.33

## 2018-07-24 MED ORDER — POTASSIUM CHLORIDE 10 MEQ/100ML IV SOLN
10.0000 meq | INTRAVENOUS | Status: AC
  Administered 2018-07-24 (×6): 10 meq via INTRAVENOUS
  Filled 2018-07-24 (×7): qty 100

## 2018-07-24 MED ORDER — HALOPERIDOL LACTATE 2 MG/ML PO CONC: 0.5000 mg | ORAL | Status: DC | PRN

## 2018-07-24 MED ORDER — HALOPERIDOL LACTATE 5 MG/ML IJ SOLN: 0.5000 mg | INTRAMUSCULAR | Status: DC | PRN

## 2018-07-24 MED ORDER — HALOPERIDOL 0.5 MG PO TABS
0.5000 mg | ORAL_TABLET | ORAL | Status: DC | PRN
  Filled 2018-07-24: qty 1

## 2018-07-24 NOTE — Plan of Care (Signed)
  Problem: Clinical Measurements: Goal: Will remain free from infection Outcome: Progressing Goal: Diagnostic test results will improve Outcome: Progressing Goal: Respiratory complications will improve Outcome: Progressing Goal: Cardiovascular complication will be avoided Outcome: Progressing   Problem: Activity: Goal: Risk for activity intolerance will decrease Outcome: Progressing   Problem: Nutrition: Goal: Adequate nutrition will be maintained Outcome: Progressing   Problem: Coping: Goal: Level of anxiety will decrease Outcome: Progressing   Problem: Elimination: Goal: Will not experience complications related to bowel motility Outcome: Progressing Goal: Will not experience complications related to urinary retention Outcome: Progressing   Problem: Pain Managment: Goal: General experience of comfort will improve Outcome: Progressing   Problem: Safety: Goal: Ability to remain free from injury will improve Outcome: Progressing   Problem: Skin Integrity: Goal: Risk for impaired skin integrity will decrease Outcome: Progressing   Problem: Education: Goal: Knowledge of General Education information will improve Description Including pain rating scale, medication(s)/side effects and non-pharmacologic comfort measures Outcome: Not Progressing Note:  Patient remains confused family aware.   Problem: Health Behavior/Discharge Planning: Goal: Ability to manage health-related needs will improve Outcome: Not Progressing   Problem: Clinical Measurements: Goal: Ability to maintain clinical measurements within normal limits will improve Outcome: Not Progressing

## 2018-07-24 NOTE — Evaluation (Signed)
MBS Patient Details  Name: Jesus Ramsey MRN: [id number] Date of Birth: 03/16/1931  Today's Date: 07/24/2018 Time: SLP Start Time (ACUTE ONLY): 1215 SLP Stop Time (ACUTE ONLY): 1240 SLP Time Calculation (min) (ACUTE ONLY): 25 min  Past Medical History:  Past Medical History:  Diagnosis Date  . Altered mental status   . B12 deficiency 01/14/2012  . B12 deficiency anemia   . BPH (benign prostatic hypertrophy)   . Cataract   . Chronic hyponatremia    [name] 01/13/2012  . Dysphagia   . Hypertension   . Mental retardation   . Pneumonia   . Psychogenic polydipsia   . Seizures ([place])    Past Surgical History:  Past Surgical History:  Procedure Laterality Date  . NO PAST SURGERIES     HPI:  Patient is an 84 y.o. male with PMH: dysphagia, hypothyroidism, hypothermia, HTN, BPH, cognitive development delays, who presented from SNF to ED with AMS, hypoglycemia, hypothermia and hypotension. Patient with recent discharge from [facility] hospital (admitted 9/1-07/15/18) with AMS. At time of discharge on 9/6, SLP was recommending Dys 1(puree) solids, thin liquids with full supervision.   Assessment / Plan / Recommendation Clinical Impression    SLP Visit Diagnosis: Dysphagia, oropharyngeal phase (R13.12)    Aspiration Risk  Severe aspiration risk    Diet Recommendation   NPO       Other  Recommendations     Follow up Recommendations Skilled Nursing facility      Frequency and Duration            Prognosis Prognosis for Safe Diet Advancement: Guarded Barriers to Reach Goals: Severity of deficits;Cognitive deficits      Swallow Study   General Date of Onset: 07/18/18 HPI: Patient is an 84 y.o. male with PMH: dysphagia, hypothyroidism, hypothermia, HTN, BPH, cognitive development delays, who presented from SNF to ED with AMS, hypoglycemia, hypothermia and hypotension. Patient with recent discharge from [facility] hospital (admitted 9/1-07/15/18) with AMS. At time of discharge on 9/6, SLP was  recommending Dys 1(puree) solids, thin liquids with full supervision. Type of Study: MBS-Modified Barium Swallow Study Previous Swallow Assessment: see HPI Diet Prior to this Study: NPO Temperature Spikes Noted: No Respiratory Status: Room air History of Recent Intubation: No Behavior/Cognition: Alert;Confused;Requires cueing;Distractible Oral Cavity - Dentition: Missing dentition Self-Feeding Abilities: Total assist Volitional Cough: Cognitively unable to elicit Volitional Swallow: Unable to elicit    Oral/Motor/Sensory Function Overall Oral Motor/Sensory Function: Mild impairment(perseverates chewing) Facial ROM: Within Functional Limits Facial Symmetry: Within Functional Limits Facial Strength: Within Functional Limits Lingual ROM: Reduced right;Reduced left Lingual Symmetry: Within Functional Limits Lingual Strength: Reduced                                    [place], MA, CCC-SLP 07/24/2018 1:04 PM

## 2018-07-24 NOTE — Progress Notes (Signed)
   Subjective:  Belton Peplinski is a 84 y.o. with PMH of dysphagia, bph, htn, hypothyroidism admit for hypothermia on hospital day 6  Mr. Guedea was examined and evaluated at bedside this a.m. He was reportedly agitated overnight, having pulled off his condom cath and attempted to get out of bed. This morning he is again somnolent. And unresponsive.  Objective:  Vital signs in last 24 hours: Vitals:   07/24/18 0102 07/24/18 0440 07/24/18 0459 07/24/18 0600  BP: (!) 147/72 (!) 137/102  139/86  Pulse: (!) 55 64    Resp:      Temp: 97.7 F (36.5 C)   (!) 97.5 F (36.4 C)  TempSrc: Oral   Oral  SpO2: 94%     Weight:   75.5 kg   Height:       Physical Exam  Constitutional: No distress.  Cachetic appearing  HENT:  Head: Normocephalic and atraumatic.  Mouth/Throat: Oropharynx is clear and moist. No oropharyngeal exudate.  Eyes: Pupils are equal, round, and reactive to light. Conjunctivae and EOM are normal.  Neck: Normal range of motion. Neck supple.  Cardiovascular: Normal rate, regular rhythm and intact distal pulses.  Distant heart sounds  Pulmonary/Chest: Effort normal.  Abdominal: Soft. Bowel sounds are normal. He exhibits no distension. There is no tenderness. There is no guarding.  Musculoskeletal: Normal range of motion. He exhibits edema (non-pitting bilateral edema).  Neurological:  Patient not alert this morning, widraws to painful stimuli, but wont open eyes or respond to verbal stimuli  Skin: Skin is dry. He is not diaphoretic.  Lower extremities cool to touch. Off bair hugger   Assessment/Plan: Mr. Thorner is a 84 year old male with past medical history of dysphasia hypothyroidism and BPH presenting with hypothermia.  Palliative on board h he was unresponsive yesterday and family had planned on moving him to full comfort care pending his condition over the weekend.  His mentation has been fluctuating, difficult to predict how he will be one day to the next.  Will monitor to  see if he can stay stable. Currently doing supportive treatments.  Altered mental status 2/2 hypothermia, hypotension, hypoglycemia Mentation improved today. Will see if he can tolerate PO intake with speech and swallow eval. Temperature stable off bair hugger. Blood pressure stable. - Palliative recs: Maintain NPO - C/w D10 130cc/hr - Speech and swallow eval - Glucose checks  Pneumonia Final blood and urine cultures negative. Completed 3 day course of Cefepime for pneumonia  Hypothyroidism - C/w Levo 12.20mcg  Diet:NPO DVT prophx: Lovenox Bowel: Milk of Magnesia, Senokot Code:DNR  Dispo: Anticipated discharge in approximately 2 day(s).   Neva Seat, MD 07/24/2018, 11:40 AM Pager: [phone]

## 2018-07-24 NOTE — Progress Notes (Signed)
Daily Progress Note   Patient Name: Jesus Ramsey       Date: 07/24/2018 DOB: 1931/11/04  Age: 84 y.o. MRN#: [id number] Attending Physician: Bartholomew Crews, MD Primary Care Physician: [name], MD Admit Date: 07/18/2018  Reason for Consultation/Follow-up: Establishing goals of care  Subjective: Spoke with [name]- patients' HCPOA today. Updated her on patient's status- mental status wax and wanes. When I evaluated patient he was sitting up in bed, unintelligble speech. Updated on swallow eval noting ongoing aspiration with no cough or clearing response. [name] stated that she and family had decided to stay with instructions on MOST form indicating comfort measures only, no IV fluids, no PEG or artificial feeding.  She said after discussing with her family they would like for patient to be transferred back to [place] with Hospice for comfort and support through end of life due to he has people there who know and love him.   Review of Systems  Unable to perform ROS: Acuity of condition    Length of Stay: 6  Current Medications: Scheduled Meds:  . chlorhexidine  15 mL Mouth Rinse BID  . dextrose  50 mL Intravenous Once  . enoxaparin (LOVENOX) injection  40 mg Subcutaneous Q24H  . levothyroxine  12.5 mcg Intravenous Daily  . mouth rinse  15 mL Mouth Rinse q12n4p  . scopolamine  1 patch Transdermal Q72H  . sodium chloride flush  3 mL Intravenous Q12H    Continuous Infusions: . sodium chloride Stopped (07/22/18 0934)  . dextrose 130 mL/hr at 07/24/18 1526  . potassium chloride 10 mEq (07/24/18 1528)  . sodium chloride Stopped (07/19/18 0458)  . sodium phosphate  Dextrose 5% IVPB      PRN Meds: sodium chloride, acetaminophen **OR** acetaminophen, ondansetron **OR**  ondansetron (ZOFRAN) IV, sodium chloride flush  Physical Exam  HENT:  Copious secretions  Neurological: He is alert.  Does not answer orientation questions  Skin: Skin is warm and dry.  Nursing note and vitals reviewed.           Vital Signs: BP (!) 156/67 (BP Location: Left Arm)   Pulse (!) 45   Temp (!) 97.5 F (36.4 C) (Oral)   Resp 15   Ht 5' 10.98" (1.803 m)   Wt 75.5 kg   SpO2 94%   BMI 23.23  kg/m  SpO2: SpO2: 94 % O2 Device: O2 Device: Nasal Cannula O2 Flow Rate: O2 Flow Rate (L/min): 2 L/min  Intake/output summary:   Intake/Output Summary (Last 24 hours) at 07/24/2018 1552 Last data filed at 07/24/2018 1000 Gross per 24 hour  Intake 2228.62 ml  Output 225 ml  Net 2003.62 ml   LBM: Last BM Date: 07/23/18 Baseline Weight: Weight: 69.5 kg Most recent weight: Weight: 75.5 kg       Palliative Assessment/Data: PPS: 20%      Patient Active Problem List   Diagnosis Date Noted  . Congestion of upper airway   . Advanced care planning/counseling discussion   . Goals of care, counseling/discussion   . Bradycardia   . Hypoglycemia   . Protein-calorie malnutrition, severe 07/14/2018  . Adult failure to thrive   . Acute metabolic encephalopathy   . Aspiration pneumonia due to gastric secretions ([place])   . Palliative care by specialist   . Altered mental status 07/10/2018  . Hypothermia 07/10/2018  . Severe sepsis with septic shock ([place])   . Hypothyroidism   . Community acquired pneumonia of left lower lobe of lung ([place])   . Acute cystitis without hematuria   . DNR (do not resuscitate) discussion   . Dysphagia 04/08/2015  . Seizures ([place]) 08/10/2014  . Essential hypertension 08/10/2014  . Absolute anemia 08/10/2014  . Weakness generalized 08/10/2014  . Psychogenic polydipsia 01/14/2012  . BPH (benign prostatic hyperplasia) 01/14/2012  . Mental retardation 01/13/2012  . Hyponatremia 01/13/2012    Palliative Care Assessment & Plan   Patient Profile: 84  y.o.maleadmitted on 9/9/2019w/ PMH of Hypothyroidism, BPH, HTN, and cognitive developmental delays presenting with altered mental status as well as hypoglycemia, hypothermia, and hypotension.And had a recent admission with similar medical conditions and was discharged4days ago back to his skilled nursing facility. Chest x-ray work-up significant for right sided pleural effusion and consolidation on the right. Patientis high risk for aspiration.  Assessment/Recommendations/Plan   Transition to full comfort care  D/C to SNF with Hospice  Goals of Care and Additional Recommendations:  Limitations on Scope of Treatment: Full Comfort Care  Code Status:  DNR  Prognosis:   < 2 weeks in the setting of ongoing aspiration, little to no po intake, variable mental status  Discharge Planning:  [place] with Hospice  Care plan was discussed with patient's [place].   Thank you for allowing the Palliative Medicine Team to assist in the care of this patient.   Time In: 1520 Time Out: 1600 Total Time 40 mins Prolonged Time Billed no      Greater than 50%  of this time was spent counseling and coordinating care related to the above assessment and plan.  Mariana Kaufman, AGNP-C Palliative Medicine   Please contact Palliative Medicine Team phone at [phone] for questions and concerns.

## 2018-07-24 NOTE — Progress Notes (Signed)
  Date: 07/24/2018  Patient name: Jesus Ramsey  Medical record number: [id number]  Date of birth: 1930/12/17   I have seen and evaluated this patient and I have discussed the plan of care with the house staff. Please see their note for complete details. I concur with their findings with the following additions/corrections:   Slight improvement in mental status today, was sitting up in bed, answered questions with unintelligible speech. Agitated overnight. Failed MBS today. Per palliative note, family planning return to his facility with hospice care. Our team will help facilitate this. If he is indeed transitioned to comfort care, would allow him to eat or drink if interested for comfort.   Lenice Pressman, M.D., Ph.D. 07/24/2018, 5:20 PM

## 2018-07-24 NOTE — Consult Note (Signed)
Modified Barium Swallow Progress Note  Patient Details  Name: Jesus Ramsey MRN: [id number] Date of Birth: February 18, 1931  Today's Date: 07/24/2018  Modified Barium Swallow completed.  Full report located under Chart Review in the Imaging Section.  Brief recommendations include the following:  Clinical Impression   Patient was tongue pumping at rest and was unable to follow commands to assist in transfer from bed to chair. He initially refused tsp of applesauce by turning his head, but then took 1/2 tsp orally. He did not initiate swallow after verbal and tactile cues. A tsp size of honey thickened liquids was given to attempt to inititiate swallow and he initiated swallow when the bolus was at the level of the valleculae. Poor tongue base retraction, inadequate laryngeal elevation and anterior movement to close laryngeal vestibule, penetration and aspiration of honey thickened liquids (mixed with puree). Pt had no cough or clearing response. No further consistencies were tested due to safety concerns. Patient is at a high risk of aspiration due to poor awareness of bolus, poor initiation of swallow and poor airway protection. Recommend patient remain NPO with alternative nutrition and medication.  Dysphagia, oropharyngeal phase (R13.12)   Swallow Evaluation Recommendations       SLP Diet Recommendations: NPO;Alternative means - long-term       Medication Administration: Via alternative means               Oral Care Recommendations: Oral care BID       Charlynne Cousins [name], MA, CCC-SLP 07/24/2018 1:09 PM

## 2018-07-25 DIAGNOSIS — I959 Hypotension, unspecified: Secondary | ICD-10-CM | POA: Diagnosis not present

## 2018-07-25 DIAGNOSIS — Z79890 Hormone replacement therapy (postmenopausal): Secondary | ICD-10-CM | POA: Diagnosis not present

## 2018-07-25 DIAGNOSIS — J180 Bronchopneumonia, unspecified organism: Secondary | ICD-10-CM | POA: Diagnosis not present

## 2018-07-25 DIAGNOSIS — Z7401 Bed confinement status: Secondary | ICD-10-CM | POA: Diagnosis not present

## 2018-07-25 DIAGNOSIS — N400 Enlarged prostate without lower urinary tract symptoms: Secondary | ICD-10-CM | POA: Diagnosis not present

## 2018-07-25 DIAGNOSIS — R680 Hypothermia, not associated with low environmental temperature: Secondary | ICD-10-CM | POA: Diagnosis not present

## 2018-07-25 DIAGNOSIS — R0902 Hypoxemia: Secondary | ICD-10-CM | POA: Diagnosis not present

## 2018-07-25 DIAGNOSIS — M2550 Pain in unspecified joint: Secondary | ICD-10-CM | POA: Diagnosis not present

## 2018-07-25 DIAGNOSIS — J918 Pleural effusion in other conditions classified elsewhere: Secondary | ICD-10-CM | POA: Diagnosis not present

## 2018-07-25 DIAGNOSIS — E162 Hypoglycemia, unspecified: Secondary | ICD-10-CM | POA: Diagnosis not present

## 2018-07-25 DIAGNOSIS — M6281 Muscle weakness (generalized): Secondary | ICD-10-CM | POA: Diagnosis present

## 2018-07-25 DIAGNOSIS — R279 Unspecified lack of coordination: Secondary | ICD-10-CM | POA: Diagnosis present

## 2018-07-25 DIAGNOSIS — E039 Hypothyroidism, unspecified: Secondary | ICD-10-CM | POA: Diagnosis not present

## 2018-07-25 DIAGNOSIS — J690 Pneumonitis due to inhalation of food and vomit: Secondary | ICD-10-CM | POA: Diagnosis not present

## 2018-07-25 DIAGNOSIS — R1310 Dysphagia, unspecified: Secondary | ICD-10-CM | POA: Diagnosis not present

## 2018-07-25 DIAGNOSIS — I10 Essential (primary) hypertension: Secondary | ICD-10-CM | POA: Diagnosis not present

## 2018-07-25 DIAGNOSIS — R001 Bradycardia, unspecified: Secondary | ICD-10-CM | POA: Diagnosis not present

## 2018-07-25 DIAGNOSIS — G301 Alzheimer's disease with late onset: Secondary | ICD-10-CM | POA: Diagnosis not present

## 2018-07-25 DIAGNOSIS — R4182 Altered mental status, unspecified: Secondary | ICD-10-CM | POA: Diagnosis not present

## 2018-07-25 DIAGNOSIS — R1312 Dysphagia, oropharyngeal phase: Secondary | ICD-10-CM | POA: Diagnosis present

## 2018-07-25 DIAGNOSIS — F0280 Dementia in other diseases classified elsewhere without behavioral disturbance: Secondary | ICD-10-CM | POA: Diagnosis not present

## 2018-07-25 DIAGNOSIS — T68XXXS Hypothermia, sequela: Secondary | ICD-10-CM | POA: Diagnosis not present

## 2018-07-25 DIAGNOSIS — Z66 Do not resuscitate: Secondary | ICD-10-CM | POA: Diagnosis not present

## 2018-07-25 MED ORDER — HALOPERIDOL LACTATE 5 MG/ML IJ SOLN: 0.5000 mg | INTRAMUSCULAR | Status: AC | PRN

## 2018-07-25 MED ORDER — MORPHINE SULFATE (CONCENTRATE) 10 MG/0.5ML PO SOLN: 5.0000 mg | ORAL | Status: AC | PRN

## 2018-07-25 MED ORDER — CHLORHEXIDINE GLUCONATE 0.12 % MT SOLN: 15.0000 mL | Freq: Two times a day (BID) | OROMUCOSAL | 0 refills | Status: AC

## 2018-07-25 MED ORDER — ONDANSETRON HCL 4 MG PO TABS: 4.0000 mg | ORAL_TABLET | Freq: Four times a day (QID) | ORAL | 0 refills | Status: AC | PRN

## 2018-07-25 MED ORDER — HALOPERIDOL LACTATE 2 MG/ML PO CONC: 0.6000 mg | ORAL | 0 refills | Status: AC | PRN

## 2018-07-25 MED ORDER — ACETAMINOPHEN 325 MG PO TABS: 650.0000 mg | ORAL_TABLET | Freq: Four times a day (QID) | ORAL | Status: AC | PRN

## 2018-07-25 MED ORDER — HALOPERIDOL 0.5 MG PO TABS: 0.5000 mg | ORAL_TABLET | ORAL | 0 refills | Status: AC | PRN

## 2018-07-25 MED ORDER — ONDANSETRON HCL 4 MG/2ML IJ SOLN: 4.0000 mg | Freq: Four times a day (QID) | INTRAMUSCULAR | 0 refills | Status: AC | PRN

## 2018-07-25 MED ORDER — SCOPOLAMINE 1 MG/3DAYS TD PT72: 1.0000 | MEDICATED_PATCH | TRANSDERMAL | 12 refills | Status: AC

## 2018-07-25 MED ORDER — ORAL CARE MOUTH RINSE: 15.0000 mL | Freq: Two times a day (BID) | OROMUCOSAL | 0 refills | Status: AC

## 2018-07-25 MED ORDER — ACETAMINOPHEN 650 MG RE SUPP: 650.0000 mg | Freq: Four times a day (QID) | RECTAL | 0 refills | Status: AC | PRN

## 2018-07-25 NOTE — Progress Notes (Addendum)
Called [facility] 3 times to give report, was put on hold each time, waited 6 minutes, 7 minutes and 4 minutes respectively. Telephone report was taken by Reggie RN- Will send paper copy of discharge instructions in packet.

## 2018-07-25 NOTE — Progress Notes (Signed)
   Subjective:  Jesus Ramsey is a 84 y.o. with PMH of Hypothyrodism admit for hypothermia on hospital day 7  Patient was evaluated and examined at bedside this AM. Observed sleeping comfortably in bed. Minimally responsive to pain. GCS 5. Known to have fluctuating mentation.  Objective:  Vital signs in last 24 hours: Vitals:   07/24/18 1329 07/24/18 2020 07/24/18 2134 07/25/18 0527  BP: (!) 156/67  (!) 147/98 (!) 164/82  Pulse: (!) 45  61 (!) 54  Resp:    18  Temp:   (!) 97.5 F (36.4 C)   TempSrc:   Oral Oral  SpO2:  93% 90% 97%  Weight:      Height:       Physical Exam  Constitutional: No distress.  Appears comfortable. Cachetic appearing  HENT:  Head: Normocephalic and atraumatic.  Dried yellow sputum observed on gown  Eyes:  Not opening eyes  Cardiovascular: Normal rate, regular rhythm, normal heart sounds and intact distal pulses.  Cool extremities  Pulmonary/Chest: Effort normal and breath sounds normal.  Abdominal: Soft. Bowel sounds are normal.  Musculoskeletal: Normal range of motion. He exhibits edema (Trace pitting edema bilateral lower extremities).  Neurological:  GCS 5. Minimally responsive to pain. No awakening.  Skin: Skin is warm and dry. He is not diaphoretic.   Assessment/Plan:  Active Problems:   Weakness generalized   Dysphagia   Hypothermia   Bradycardia   Hypoglycemia   Congestion of upper airway   Advanced care planning/counseling discussion   Goals of care, counseling/discussion  Jesus Ramsey is a 84 year old male with past medical history of dysphasia hypothyroidism and BPH presenting with hypothermia. Family agrees to full comfort care. Will discharge today to SNF w/ Hospice per family request.  Altered mental status 2/2 hypothermia, hypotension, hypoglycemia Fluctuating mentation. NPO per barium swallow eval. Temperature stable off bair hugger. Blood pressure stable. - Palliative recs: Full comfort care. Discharge to SNF with Hospice -  STOP D10 130cc/hr - Discharge today to SNF w/ Hospice  Hypothyroidism - C/w Levo 12.38mcg PO if tolerating sips with meds  Diet:NPO DVT prophx: Lovenox Bowel: Milk of Magnesia, Senokot Code:DNR - comfort care  Dispo: Anticipated discharge in approximately today(s).   Note started by [facility], MD [name], MD 07/25/2018, 10:59 AM Pager: [phone]

## 2018-07-25 NOTE — NC FL2 (Signed)
[place] MEDICAID FL2 LEVEL OF CARE SCREENING TOOL     IDENTIFICATION  Patient Name: Jesus Ramsey Birthdate: 11/18/1930 Sex: male Admission Date (Current Location): 07/18/2018  [facility] and [place] Number:  Herbalist and Address:  [facility], [place] [address] [place], [place] [address]      Provider Number: [id number]  Attending Physician Name and Address:  Bartholomew Crews, MD  Relative Name and Phone Number:  Lawson Mahone [phone]    Current Level of Care: Hospital Recommended Level of Care: [place] Prior Approval Number:    Date Approved/Denied:   PASRR Number: [id number]  Discharge Plan: SNF    Current Diagnoses: Patient Active Problem List   Diagnosis Date Noted  . Congestion of upper airway   . Advanced care planning/counseling discussion   . Goals of care, counseling/discussion   . Bradycardia   . Hypoglycemia   . Protein-calorie malnutrition, severe 07/14/2018  . Adult failure to thrive   . Acute metabolic encephalopathy   . Aspiration pneumonia due to gastric secretions ([place])   . Palliative care by specialist   . Altered mental status 07/10/2018  . Hypothermia 07/10/2018  . Severe sepsis with septic shock ([place])   . Hypothyroidism   . Community acquired pneumonia of left lower lobe of lung ([place])   . Acute cystitis without hematuria   . DNR (do not resuscitate) discussion   . Dysphagia 04/08/2015  . Seizures ([place]) 08/10/2014  . Essential hypertension 08/10/2014  . Absolute anemia 08/10/2014  . Weakness generalized 08/10/2014  . Psychogenic polydipsia 01/14/2012  . BPH (benign prostatic hyperplasia) 01/14/2012  . Mental retardation 01/13/2012  . Hyponatremia 01/13/2012    Orientation RESPIRATION BLADDER Height & Weight     Self  O2(nasa cannula 2L/m) Incontinent, External catheter Weight: 166 lb 7.2 oz (75.5 kg) Height:  5' 10.98" (180.3 cm)  BEHAVIORAL SYMPTOMS/MOOD NEUROLOGICAL BOWEL  NUTRITION STATUS      Continent Diet(see discharge summary)  AMBULATORY STATUS COMMUNICATION OF NEEDS Skin   Limited Assist Verbally Other (Comment)(MASD buttocks/scrotum, cracks left knee, right and left legs)                       Personal Care Assistance Level of Assistance  Bathing, Feeding, Dressing, Total care Bathing Assistance: Limited assistance Feeding assistance: Limited assistance Dressing Assistance: Limited assistance Total Care Assistance: Limited assistance   Functional Limitations Info  Sight, Hearing, Speech Sight Info: Adequate Hearing Info: Adequate Speech Info: Impaired    SPECIAL CARE FACTORS FREQUENCY  PT (By licensed PT), OT (By licensed OT)     PT Frequency: 3x weekly OT Frequency: 3x weekly            Contractures Contractures Info: Not present    Additional Factors Info  Code Status, Allergies, Isolation Precautions Code Status Info: DNR Allergies Info: No known     Isolation Precautions Info: MRSA by PCR     Current Medications (07/25/2018):  This is the current hospital active medication list Current Facility-Administered Medications  Medication Dose Route Frequency Provider Last Rate Last Dose  . 0.9 %  sodium chloride infusion  250 mL Intravenous PRN Juanito Doom, MD   Stopped at 07/22/18 [phone]  . acetaminophen (TYLENOL) tablet 650 mg  650 mg Oral Q6H PRN Juanito Doom, MD       Or  . acetaminophen (TYLENOL) suppository 650 mg  650 mg Rectal Q6H PRN Juanito Doom, MD      .  chlorhexidine (PERIDEX) 0.12 % solution 15 mL  15 mL Mouth Rinse BID Simonne Maffucci B, MD   15 mL at 07/24/18 2300  . dextrose 50 % solution 50 mL  50 mL Intravenous Once Bartholomew Crews, MD      . haloperidol (HALDOL) tablet 0.5 mg  0.5 mg Oral Q4H PRN Earlie Counts, NP       Or  . haloperidol (HALDOL) 2 MG/ML solution 0.5 mg  0.5 mg Sublingual Q4H PRN Earlie Counts, NP       Or  . haloperidol lactate (HALDOL) injection 0.5 mg  0.5 mg  Intravenous Q4H PRN Earlie Counts, NP      . MEDLINE mouth rinse  15 mL Mouth Rinse q12n4p Simonne Maffucci B, MD   15 mL at 07/24/18 1528  . morphine CONCENTRATE 10 MG/0.5ML oral solution 5 mg  5 mg Oral Q2H PRN Earlie Counts, NP       Or  . morphine CONCENTRATE 10 MG/0.5ML oral solution 5 mg  5 mg Sublingual Q2H PRN Earlie Counts, NP      . ondansetron (ZOFRAN) tablet 4 mg  4 mg Oral Q6H PRN Juanito Doom, MD       Or  . ondansetron (ZOFRAN) injection 4 mg  4 mg Intravenous Q6H PRN Simonne Maffucci B, MD      . scopolamine (TRANSDERM-SCOP) 1 MG/3DAYS 1.5 mg  1 patch Transdermal Q72H Simonne Maffucci B, MD   1.5 mg at 07/22/18 0941  . sodium chloride 0.9 % bolus 1,000 mL  1,000 mL Intravenous Once Juanito Doom, MD   Stopped at 07/19/18 0458  . sodium chloride flush (NS) 0.9 % injection 3 mL  3 mL Intravenous Q12H Simonne Maffucci B, MD   3 mL at 07/24/18 2300  . sodium chloride flush (NS) 0.9 % injection 3 mL  3 mL Intravenous PRN Juanito Doom, MD   3 mL at 07/20/18 1025     Discharge Medications: Please see discharge summary for a list of discharge medications.  Relevant Imaging Results:  Relevant Lab Results:   Additional Information [id number] add hospice  [facility], LCSW

## 2018-07-25 NOTE — Progress Notes (Addendum)
Patient will DC to:[facility] Anticipated DC date: 07/25/18 Family notified: [name] and [name] Transport by: Corey Harold  Per MD patient ready for DC to SNF ([facility]) . RN, patient, patient's family, and facility notified of DC. Discharge Summary sent to facility. RN given number for report [phone] [id number]. DC packet on chart. Ambulance transport requested for patient.  CSW signing off.  [place], [place]

## 2018-07-25 NOTE — Progress Notes (Signed)
  Date: 07/25/2018  Patient name: Jhace Fennell  Medical record number: [id number]  Date of birth: 1931/07/30   I have seen and evaluated this patient and I have discussed the plan of care with the house staff. Please see their note for complete details. I concur with their findings with the following additions/corrections: Mr Harkleroad was seen on morning rounds with the team.  He is now comfort care and will be transitioned back to his SNF with hospice care.  He can have any comfort feedings as quality of life is our goal rather than quantity.  This will allow him to take any necessary oral medications.  We are working with social work and palliative to time his transfer back to his SNF.  Bartholomew Crews, MD 07/25/2018, 11:52 AM

## 2018-07-25 NOTE — Discharge Summary (Signed)
Name: Jesus Ramsey MRN: [id number] DOB: 1931/05/26 84 y.o. PCP: Jesus Marble, MD  Date of Admission: 07/18/2018  9:47 AM Date of Discharge: 07/25/2018  12:00 PM Attending Physician: Jesus Crews, MD  Discharge Diagnosis: 1. Altered Mental status 2. Hypothermia 3. Hypoglycemia 4. Hypotension 5. Hypothyroidism  Discharge Medications: Allergies as of 07/25/2018   No Known Allergies     Medication List    STOP taking these medications   aspirin 81 MG chewable tablet   hydrochlorothiazide 12.5 MG tablet Commonly known as:  HYDRODIURIL   levothyroxine 25 MCG tablet Commonly known as:  SYNTHROID, LEVOTHROID   magnesium hydroxide 400 MG/5ML suspension Commonly known as:  MILK OF MAGNESIA   Metoprolol Tartrate 75 MG Tabs   sodium chloride 1 g tablet   tamsulosin 0.4 MG Caps capsule Commonly known as:  FLOMAX     TAKE these medications   acetaminophen 325 MG tablet Commonly known as:  TYLENOL Take 2 tablets (650 mg total) by mouth every 6 (six) hours as needed for mild pain (or Fever >/= 101). What changed:    reasons to take this  additional instructions   acetaminophen 650 MG suppository Commonly known as:  TYLENOL Place 1 suppository (650 mg total) rectally every 6 (six) hours as needed for mild pain (or Fever >/= 101). What changed:  You were already taking a medication with the same name, and this prescription was added. Make sure you understand how and when to take each.   chlorhexidine 0.12 % solution Commonly known as:  PERIDEX 15 mLs by Mouth Rinse route 2 (two) times daily.   haloperidol 0.5 MG tablet Commonly known as:  HALDOL Take 1 tablet (0.5 mg total) by mouth every 4 (four) hours as needed for agitation (or delirium).   haloperidol 2 MG/ML solution Commonly known as:  HALDOL Place 0.3 mLs (0.6 mg total) under the tongue every 4 (four) hours as needed for agitation (or delirium).   haloperidol lactate 5 MG/ML injection Commonly  known as:  HALDOL Inject 0.1 mLs (0.5 mg total) into the vein every 4 (four) hours as needed (or delirium).   latanoprost 0.005 % ophthalmic solution Commonly known as:  XALATAN Place 1 drop into both eyes at bedtime.   morphine CONCENTRATE 10 MG/0.5ML Soln concentrated solution Take 0.25 mLs (5 mg total) by mouth every 2 (two) hours as needed for moderate pain (or dyspnea).   morphine CONCENTRATE 10 MG/0.5ML Soln concentrated solution Place 0.25 mLs (5 mg total) under the tongue every 2 (two) hours as needed for moderate pain (or dyspnea).   mouth rinse Liqd solution 15 mLs by Mouth Rinse route 2 times daily at 12 noon and 4 pm.   ondansetron 4 MG tablet Commonly known as:  ZOFRAN Take 1 tablet (4 mg total) by mouth every 6 (six) hours as needed for nausea.   ondansetron 4 MG/2ML Soln injection Commonly known as:  ZOFRAN Inject 2 mLs (4 mg total) into the vein every 6 (six) hours as needed for nausea.   scopolamine 1 MG/3DAYS Commonly known as:  TRANSDERM-SCOP Place 1 patch (1.5 mg total) onto the skin every 3 (three) days.   timolol 0.5 % ophthalmic solution Commonly known as:  TIMOPTIC Place 1 drop into both eyes every morning.       Disposition and follow-up:   JesusSyd Ramsey was discharged from [facility] in [place] condition.  At the hospital follow up visit please address:  1.  Altered mental  status w/ Hypothermia, Hypoglycemia, Hypotension:  - Palliative had family meeting and patient is now [place] meaning no IV Fluids, no PEG, or artifical feeding and DNR code status.  - Focus on comfort only. Recommend not checking labs.  2.  Labs / imaging needed at time of follow-up: None  3.  Pending labs/ test needing follow-up: None  Hospital Course by problem list:  1. Altered Mental Status: Resented with minimal response GCS score of 7.  Had CT during last admission that showed no acute abnormalities seem to be due to hypothermia.  Had waxing  and waning mentation during admission.  Currently minimally responsive.  Patient unable to make medical decisions.  Palliative consulted with family members and the patient is now DNR with full comfort care only.  Will be discharged to SNF with hospice.  2. Hypothermia: Presented with temperature of 87.9 F.  Put on bair hugger for 3 days.  Patient was taken off bear hugger and continued to maintain temperature of 97.5 F.  3. Hypoglycemia: Had history of intermittent hypoglycemic episode of unknown origin on prior admission.  Was put on D10W at 130 cc/h during admission when bg started to downtredn.  Lowest BG down to 53.  IV fluids stopped once patient became full comfort care.  4. Hypotension: Presented with stable blood pressure.  On day 1 and admission blood pressure dropped significantly down to 79/44.  Tinea to stay low despite aggressive fluid resuscitation.  Was started on phenylephrine and transferred to the ICU.  Came off pressors after 5 hours and patient returned to baseline blood pressure.  5. Multilobar Bronchopneumonia: Presented with right-sided pleural effusion and questionable consolidation on x-ray.  Started on prophylactic cefepime Vanco and Flagyl.  Blood culture urine culture collected.  CT chest was performed which showed multilevel bronchopneumonia. Cefepime was continued.  Speech and swallow eval was performed due to concerns for aspiration.  Barium swallow study showed significant aspiration risk.  Patient was put on n.p.o.   Blood culture and urine culture resulted negative.  No leukocytosis during admission.  Patient completed 5-day course of cefepime.  Discharged on no antibiotics.  Discharge Vitals:   BP (!) 164/82 (BP Location: Left Arm)   Pulse 70   Temp (!) 97.5 F (36.4 C) (Oral)   Resp 18   Ht 5' 10.98" (1.803 m)   Wt 75.5 kg   SpO2 97%   BMI 23.23 kg/m   Pertinent Labs, Studies, and Procedures:  BMP Latest Ref Rng & Units 07/24/2018 07/23/2018 07/19/2018    Glucose 70 - 99 mg/dL 99 104(H) 64(L)  BUN 8 - 23 mg/dL <5(L) 5(L) 7(L)  Creatinine 0.61 - 1.24 mg/dL 0.56(L) 0.64 0.87  Sodium 135 - 145 mmol/L 137 137 139  Potassium 3.5 - 5.1 mmol/L 3.2(L) 3.3(L) 3.8  Chloride 98 - 111 mmol/L 99 99 104  CO2 22 - 32 mmol/L 30 33(H) 30  Calcium 8.9 - 10.3 mg/dL 7.9(L) 8.6(L) 8.0(L)   Discharge Instructions: Discharge Instructions    Call MD for:  severe uncontrolled pain   Complete by:  As directed    Discharge instructions   Complete by:  As directed    JesusCorral will be discharged with his code status changed to DNR with full comfort care. Npo per speech and swallow but if he is requesting food and fluid, give him whatever he needs to feel comfortable. Thank you.   Increase activity slowly   Complete by:  As directed  Signed: [name], MD 07/25/2018, 11:53 AM   Pager: [phone]

## 2018-07-25 NOTE — Consult Note (Signed)
   [facility] CM Inpatient Consult   07/25/2018  Jesus Ramsey September 25, 1931 [id number]  Patient screened for readmission with a high score for unplanned readmission noted in [employer] plan.  Patient is noted as long term care in the skilled facility and disposition is to return with comfort care per MD notes.  No community care management needs are noted. For questions contact:   Natividad Brood, RN BSN [facility] Liaison  [phone] business mobile phone Toll free office [phone]

## 2018-07-27 DIAGNOSIS — T68XXXS Hypothermia, sequela: Secondary | ICD-10-CM | POA: Diagnosis not present

## 2018-07-27 DIAGNOSIS — F0280 Dementia in other diseases classified elsewhere without behavioral disturbance: Secondary | ICD-10-CM | POA: Diagnosis not present

## 2018-07-27 DIAGNOSIS — J690 Pneumonitis due to inhalation of food and vomit: Secondary | ICD-10-CM | POA: Diagnosis not present

## 2018-07-27 DIAGNOSIS — G301 Alzheimer's disease with late onset: Secondary | ICD-10-CM | POA: Diagnosis not present

## 2018-07-28 DIAGNOSIS — I10 Essential (primary) hypertension: Secondary | ICD-10-CM | POA: Diagnosis not present

## 2018-07-28 DIAGNOSIS — J690 Pneumonitis due to inhalation of food and vomit: Secondary | ICD-10-CM | POA: Diagnosis not present

## 2018-07-28 DIAGNOSIS — T68XXXS Hypothermia, sequela: Secondary | ICD-10-CM | POA: Diagnosis not present

## 2018-07-30 LAB — SULFONYLUREA HYPOGLYCEMICS PANEL, SERUM
Acetohexamide: NEGATIVE ug/mL (ref 20–60)
CHLORPROPAMIDE: NEGATIVE ug/mL (ref 75–250)
Glimepiride: NEGATIVE ng/mL (ref 80–250)
Glipizide: NEGATIVE ng/mL (ref 200–1000)
Glyburide: NEGATIVE ng/mL
Nateglinide: NEGATIVE ng/mL
Repaglinide: NEGATIVE ng/mL
Tolazamide: NEGATIVE ug/mL
Tolbutamide: NEGATIVE ug/mL (ref 40–100)

## 2018-08-09 DIAGNOSIS — R2689 Other abnormalities of gait and mobility: Secondary | ICD-10-CM | POA: Diagnosis not present

## 2018-08-09 DIAGNOSIS — G301 Alzheimer's disease with late onset: Secondary | ICD-10-CM | POA: Diagnosis not present

## 2018-08-09 DIAGNOSIS — I679 Cerebrovascular disease, unspecified: Secondary | ICD-10-CM | POA: Diagnosis not present

## 2018-08-09 DIAGNOSIS — T68XXXS Hypothermia, sequela: Secondary | ICD-10-CM | POA: Diagnosis not present

## 2018-08-09 DIAGNOSIS — M6281 Muscle weakness (generalized): Secondary | ICD-10-CM | POA: Diagnosis not present

## 2018-08-09 DIAGNOSIS — R41841 Cognitive communication deficit: Secondary | ICD-10-CM | POA: Diagnosis not present

## 2018-08-09 DIAGNOSIS — I9589 Other hypotension: Secondary | ICD-10-CM | POA: Diagnosis not present

## 2018-08-09 DIAGNOSIS — R1312 Dysphagia, oropharyngeal phase: Secondary | ICD-10-CM | POA: Diagnosis not present

## 2018-08-09 DIAGNOSIS — I10 Essential (primary) hypertension: Secondary | ICD-10-CM | POA: Diagnosis not present

## 2018-08-09 DIAGNOSIS — R279 Unspecified lack of coordination: Secondary | ICD-10-CM | POA: Diagnosis not present

## 2018-08-09 DIAGNOSIS — R4182 Altered mental status, unspecified: Secondary | ICD-10-CM | POA: Diagnosis not present

## 2018-08-09 DIAGNOSIS — J189 Pneumonia, unspecified organism: Secondary | ICD-10-CM | POA: Diagnosis not present

## 2018-08-10 DIAGNOSIS — R1312 Dysphagia, oropharyngeal phase: Secondary | ICD-10-CM | POA: Diagnosis not present

## 2018-08-10 DIAGNOSIS — I679 Cerebrovascular disease, unspecified: Secondary | ICD-10-CM | POA: Diagnosis not present

## 2018-08-10 DIAGNOSIS — I10 Essential (primary) hypertension: Secondary | ICD-10-CM | POA: Diagnosis not present

## 2018-08-10 DIAGNOSIS — I9589 Other hypotension: Secondary | ICD-10-CM | POA: Diagnosis not present

## 2018-08-10 DIAGNOSIS — G301 Alzheimer's disease with late onset: Secondary | ICD-10-CM | POA: Diagnosis not present

## 2018-08-10 DIAGNOSIS — R2689 Other abnormalities of gait and mobility: Secondary | ICD-10-CM | POA: Diagnosis not present

## 2018-08-11 DIAGNOSIS — G301 Alzheimer's disease with late onset: Secondary | ICD-10-CM | POA: Diagnosis not present

## 2018-08-11 DIAGNOSIS — I10 Essential (primary) hypertension: Secondary | ICD-10-CM | POA: Diagnosis not present

## 2018-08-11 DIAGNOSIS — I9589 Other hypotension: Secondary | ICD-10-CM | POA: Diagnosis not present

## 2018-08-11 DIAGNOSIS — I679 Cerebrovascular disease, unspecified: Secondary | ICD-10-CM | POA: Diagnosis not present

## 2018-08-11 DIAGNOSIS — R1312 Dysphagia, oropharyngeal phase: Secondary | ICD-10-CM | POA: Diagnosis not present

## 2018-08-11 DIAGNOSIS — R2689 Other abnormalities of gait and mobility: Secondary | ICD-10-CM | POA: Diagnosis not present

## 2018-08-12 DIAGNOSIS — I10 Essential (primary) hypertension: Secondary | ICD-10-CM | POA: Diagnosis not present

## 2018-08-12 DIAGNOSIS — I679 Cerebrovascular disease, unspecified: Secondary | ICD-10-CM | POA: Diagnosis not present

## 2018-08-12 DIAGNOSIS — I9589 Other hypotension: Secondary | ICD-10-CM | POA: Diagnosis not present

## 2018-08-12 DIAGNOSIS — R2689 Other abnormalities of gait and mobility: Secondary | ICD-10-CM | POA: Diagnosis not present

## 2018-08-12 DIAGNOSIS — G301 Alzheimer's disease with late onset: Secondary | ICD-10-CM | POA: Diagnosis not present

## 2018-08-12 DIAGNOSIS — R1312 Dysphagia, oropharyngeal phase: Secondary | ICD-10-CM | POA: Diagnosis not present

## 2018-08-15 DIAGNOSIS — R2689 Other abnormalities of gait and mobility: Secondary | ICD-10-CM | POA: Diagnosis not present

## 2018-08-15 DIAGNOSIS — R1312 Dysphagia, oropharyngeal phase: Secondary | ICD-10-CM | POA: Diagnosis not present

## 2018-08-15 DIAGNOSIS — G301 Alzheimer's disease with late onset: Secondary | ICD-10-CM | POA: Diagnosis not present

## 2018-08-15 DIAGNOSIS — I679 Cerebrovascular disease, unspecified: Secondary | ICD-10-CM | POA: Diagnosis not present

## 2018-08-15 DIAGNOSIS — I9589 Other hypotension: Secondary | ICD-10-CM | POA: Diagnosis not present

## 2018-08-15 DIAGNOSIS — I10 Essential (primary) hypertension: Secondary | ICD-10-CM | POA: Diagnosis not present

## 2018-08-16 DIAGNOSIS — I9589 Other hypotension: Secondary | ICD-10-CM | POA: Diagnosis not present

## 2018-08-16 DIAGNOSIS — I679 Cerebrovascular disease, unspecified: Secondary | ICD-10-CM | POA: Diagnosis not present

## 2018-08-16 DIAGNOSIS — I10 Essential (primary) hypertension: Secondary | ICD-10-CM | POA: Diagnosis not present

## 2018-08-16 DIAGNOSIS — R2689 Other abnormalities of gait and mobility: Secondary | ICD-10-CM | POA: Diagnosis not present

## 2018-08-16 DIAGNOSIS — G301 Alzheimer's disease with late onset: Secondary | ICD-10-CM | POA: Diagnosis not present

## 2018-08-16 DIAGNOSIS — R1312 Dysphagia, oropharyngeal phase: Secondary | ICD-10-CM | POA: Diagnosis not present

## 2018-08-17 DIAGNOSIS — R1312 Dysphagia, oropharyngeal phase: Secondary | ICD-10-CM | POA: Diagnosis not present

## 2018-08-17 DIAGNOSIS — I10 Essential (primary) hypertension: Secondary | ICD-10-CM | POA: Diagnosis not present

## 2018-08-17 DIAGNOSIS — I9589 Other hypotension: Secondary | ICD-10-CM | POA: Diagnosis not present

## 2018-08-17 DIAGNOSIS — G301 Alzheimer's disease with late onset: Secondary | ICD-10-CM | POA: Diagnosis not present

## 2018-08-17 DIAGNOSIS — R2689 Other abnormalities of gait and mobility: Secondary | ICD-10-CM | POA: Diagnosis not present

## 2018-08-17 DIAGNOSIS — I679 Cerebrovascular disease, unspecified: Secondary | ICD-10-CM | POA: Diagnosis not present

## 2018-08-18 DIAGNOSIS — R2689 Other abnormalities of gait and mobility: Secondary | ICD-10-CM | POA: Diagnosis not present

## 2018-08-18 DIAGNOSIS — R1312 Dysphagia, oropharyngeal phase: Secondary | ICD-10-CM | POA: Diagnosis not present

## 2018-08-18 DIAGNOSIS — I10 Essential (primary) hypertension: Secondary | ICD-10-CM | POA: Diagnosis not present

## 2018-08-18 DIAGNOSIS — I9589 Other hypotension: Secondary | ICD-10-CM | POA: Diagnosis not present

## 2018-08-18 DIAGNOSIS — G301 Alzheimer's disease with late onset: Secondary | ICD-10-CM | POA: Diagnosis not present

## 2018-08-18 DIAGNOSIS — I679 Cerebrovascular disease, unspecified: Secondary | ICD-10-CM | POA: Diagnosis not present

## 2018-08-19 DIAGNOSIS — I10 Essential (primary) hypertension: Secondary | ICD-10-CM | POA: Diagnosis not present

## 2018-08-19 DIAGNOSIS — R2689 Other abnormalities of gait and mobility: Secondary | ICD-10-CM | POA: Diagnosis not present

## 2018-08-19 DIAGNOSIS — I679 Cerebrovascular disease, unspecified: Secondary | ICD-10-CM | POA: Diagnosis not present

## 2018-08-19 DIAGNOSIS — R1312 Dysphagia, oropharyngeal phase: Secondary | ICD-10-CM | POA: Diagnosis not present

## 2018-08-19 DIAGNOSIS — I9589 Other hypotension: Secondary | ICD-10-CM | POA: Diagnosis not present

## 2018-08-19 DIAGNOSIS — G301 Alzheimer's disease with late onset: Secondary | ICD-10-CM | POA: Diagnosis not present

## 2018-08-21 DIAGNOSIS — I10 Essential (primary) hypertension: Secondary | ICD-10-CM | POA: Diagnosis not present

## 2018-08-21 DIAGNOSIS — I9589 Other hypotension: Secondary | ICD-10-CM | POA: Diagnosis not present

## 2018-08-21 DIAGNOSIS — G301 Alzheimer's disease with late onset: Secondary | ICD-10-CM | POA: Diagnosis not present

## 2018-08-21 DIAGNOSIS — R2689 Other abnormalities of gait and mobility: Secondary | ICD-10-CM | POA: Diagnosis not present

## 2018-08-21 DIAGNOSIS — R1312 Dysphagia, oropharyngeal phase: Secondary | ICD-10-CM | POA: Diagnosis not present

## 2018-08-21 DIAGNOSIS — I679 Cerebrovascular disease, unspecified: Secondary | ICD-10-CM | POA: Diagnosis not present

## 2018-08-22 DIAGNOSIS — G301 Alzheimer's disease with late onset: Secondary | ICD-10-CM | POA: Diagnosis not present

## 2018-08-22 DIAGNOSIS — I679 Cerebrovascular disease, unspecified: Secondary | ICD-10-CM | POA: Diagnosis not present

## 2018-08-22 DIAGNOSIS — R1312 Dysphagia, oropharyngeal phase: Secondary | ICD-10-CM | POA: Diagnosis not present

## 2018-08-22 DIAGNOSIS — I872 Venous insufficiency (chronic) (peripheral): Secondary | ICD-10-CM | POA: Diagnosis not present

## 2018-08-22 DIAGNOSIS — I9589 Other hypotension: Secondary | ICD-10-CM | POA: Diagnosis not present

## 2018-08-22 DIAGNOSIS — R569 Unspecified convulsions: Secondary | ICD-10-CM | POA: Diagnosis not present

## 2018-08-22 DIAGNOSIS — I10 Essential (primary) hypertension: Secondary | ICD-10-CM | POA: Diagnosis not present

## 2018-08-22 DIAGNOSIS — R2689 Other abnormalities of gait and mobility: Secondary | ICD-10-CM | POA: Diagnosis not present

## 2018-08-23 DIAGNOSIS — R569 Unspecified convulsions: Secondary | ICD-10-CM | POA: Diagnosis not present

## 2018-08-23 DIAGNOSIS — R2689 Other abnormalities of gait and mobility: Secondary | ICD-10-CM | POA: Diagnosis not present

## 2018-08-23 DIAGNOSIS — G301 Alzheimer's disease with late onset: Secondary | ICD-10-CM | POA: Diagnosis not present

## 2018-08-23 DIAGNOSIS — I679 Cerebrovascular disease, unspecified: Secondary | ICD-10-CM | POA: Diagnosis not present

## 2018-08-23 DIAGNOSIS — I10 Essential (primary) hypertension: Secondary | ICD-10-CM | POA: Diagnosis not present

## 2018-08-23 DIAGNOSIS — R1312 Dysphagia, oropharyngeal phase: Secondary | ICD-10-CM | POA: Diagnosis not present

## 2018-08-23 DIAGNOSIS — I9589 Other hypotension: Secondary | ICD-10-CM | POA: Diagnosis not present

## 2018-08-24 DIAGNOSIS — R2689 Other abnormalities of gait and mobility: Secondary | ICD-10-CM | POA: Diagnosis not present

## 2018-08-24 DIAGNOSIS — R1312 Dysphagia, oropharyngeal phase: Secondary | ICD-10-CM | POA: Diagnosis not present

## 2018-08-24 DIAGNOSIS — I679 Cerebrovascular disease, unspecified: Secondary | ICD-10-CM | POA: Diagnosis not present

## 2018-08-24 DIAGNOSIS — I10 Essential (primary) hypertension: Secondary | ICD-10-CM | POA: Diagnosis not present

## 2018-08-24 DIAGNOSIS — G301 Alzheimer's disease with late onset: Secondary | ICD-10-CM | POA: Diagnosis not present

## 2018-08-24 DIAGNOSIS — I9589 Other hypotension: Secondary | ICD-10-CM | POA: Diagnosis not present

## 2018-08-25 DIAGNOSIS — R1312 Dysphagia, oropharyngeal phase: Secondary | ICD-10-CM | POA: Diagnosis not present

## 2018-08-25 DIAGNOSIS — G301 Alzheimer's disease with late onset: Secondary | ICD-10-CM | POA: Diagnosis not present

## 2018-08-25 DIAGNOSIS — I9589 Other hypotension: Secondary | ICD-10-CM | POA: Diagnosis not present

## 2018-08-25 DIAGNOSIS — I10 Essential (primary) hypertension: Secondary | ICD-10-CM | POA: Diagnosis not present

## 2018-08-25 DIAGNOSIS — I679 Cerebrovascular disease, unspecified: Secondary | ICD-10-CM | POA: Diagnosis not present

## 2018-08-25 DIAGNOSIS — R2689 Other abnormalities of gait and mobility: Secondary | ICD-10-CM | POA: Diagnosis not present

## 2018-08-26 DIAGNOSIS — G301 Alzheimer's disease with late onset: Secondary | ICD-10-CM | POA: Diagnosis not present

## 2018-08-26 DIAGNOSIS — I9589 Other hypotension: Secondary | ICD-10-CM | POA: Diagnosis not present

## 2018-08-26 DIAGNOSIS — I679 Cerebrovascular disease, unspecified: Secondary | ICD-10-CM | POA: Diagnosis not present

## 2018-08-26 DIAGNOSIS — R1312 Dysphagia, oropharyngeal phase: Secondary | ICD-10-CM | POA: Diagnosis not present

## 2018-08-26 DIAGNOSIS — I10 Essential (primary) hypertension: Secondary | ICD-10-CM | POA: Diagnosis not present

## 2018-08-26 DIAGNOSIS — R2689 Other abnormalities of gait and mobility: Secondary | ICD-10-CM | POA: Diagnosis not present

## 2018-08-29 DIAGNOSIS — G301 Alzheimer's disease with late onset: Secondary | ICD-10-CM | POA: Diagnosis not present

## 2018-08-29 DIAGNOSIS — R2689 Other abnormalities of gait and mobility: Secondary | ICD-10-CM | POA: Diagnosis not present

## 2018-08-29 DIAGNOSIS — I10 Essential (primary) hypertension: Secondary | ICD-10-CM | POA: Diagnosis not present

## 2018-08-29 DIAGNOSIS — I9589 Other hypotension: Secondary | ICD-10-CM | POA: Diagnosis not present

## 2018-08-29 DIAGNOSIS — R1312 Dysphagia, oropharyngeal phase: Secondary | ICD-10-CM | POA: Diagnosis not present

## 2018-08-29 DIAGNOSIS — I679 Cerebrovascular disease, unspecified: Secondary | ICD-10-CM | POA: Diagnosis not present

## 2018-08-30 DIAGNOSIS — I10 Essential (primary) hypertension: Secondary | ICD-10-CM | POA: Diagnosis not present

## 2018-08-30 DIAGNOSIS — I679 Cerebrovascular disease, unspecified: Secondary | ICD-10-CM | POA: Diagnosis not present

## 2018-08-30 DIAGNOSIS — I9589 Other hypotension: Secondary | ICD-10-CM | POA: Diagnosis not present

## 2018-08-30 DIAGNOSIS — G301 Alzheimer's disease with late onset: Secondary | ICD-10-CM | POA: Diagnosis not present

## 2018-08-30 DIAGNOSIS — R2689 Other abnormalities of gait and mobility: Secondary | ICD-10-CM | POA: Diagnosis not present

## 2018-08-30 DIAGNOSIS — R1312 Dysphagia, oropharyngeal phase: Secondary | ICD-10-CM | POA: Diagnosis not present

## 2018-08-31 DIAGNOSIS — R2689 Other abnormalities of gait and mobility: Secondary | ICD-10-CM | POA: Diagnosis not present

## 2018-08-31 DIAGNOSIS — I10 Essential (primary) hypertension: Secondary | ICD-10-CM | POA: Diagnosis not present

## 2018-08-31 DIAGNOSIS — I679 Cerebrovascular disease, unspecified: Secondary | ICD-10-CM | POA: Diagnosis not present

## 2018-08-31 DIAGNOSIS — R1312 Dysphagia, oropharyngeal phase: Secondary | ICD-10-CM | POA: Diagnosis not present

## 2018-08-31 DIAGNOSIS — G301 Alzheimer's disease with late onset: Secondary | ICD-10-CM | POA: Diagnosis not present

## 2018-08-31 DIAGNOSIS — I9589 Other hypotension: Secondary | ICD-10-CM | POA: Diagnosis not present

## 2018-09-01 DIAGNOSIS — R2689 Other abnormalities of gait and mobility: Secondary | ICD-10-CM | POA: Diagnosis not present

## 2018-09-01 DIAGNOSIS — I679 Cerebrovascular disease, unspecified: Secondary | ICD-10-CM | POA: Diagnosis not present

## 2018-09-01 DIAGNOSIS — R1312 Dysphagia, oropharyngeal phase: Secondary | ICD-10-CM | POA: Diagnosis not present

## 2018-09-01 DIAGNOSIS — I9589 Other hypotension: Secondary | ICD-10-CM | POA: Diagnosis not present

## 2018-09-01 DIAGNOSIS — G301 Alzheimer's disease with late onset: Secondary | ICD-10-CM | POA: Diagnosis not present

## 2018-09-01 DIAGNOSIS — I10 Essential (primary) hypertension: Secondary | ICD-10-CM | POA: Diagnosis not present

## 2018-09-02 DIAGNOSIS — R1312 Dysphagia, oropharyngeal phase: Secondary | ICD-10-CM | POA: Diagnosis not present

## 2018-09-02 DIAGNOSIS — G301 Alzheimer's disease with late onset: Secondary | ICD-10-CM | POA: Diagnosis not present

## 2018-09-02 DIAGNOSIS — I679 Cerebrovascular disease, unspecified: Secondary | ICD-10-CM | POA: Diagnosis not present

## 2018-09-02 DIAGNOSIS — R2689 Other abnormalities of gait and mobility: Secondary | ICD-10-CM | POA: Diagnosis not present

## 2018-09-02 DIAGNOSIS — I10 Essential (primary) hypertension: Secondary | ICD-10-CM | POA: Diagnosis not present

## 2018-09-02 DIAGNOSIS — I9589 Other hypotension: Secondary | ICD-10-CM | POA: Diagnosis not present

## 2018-09-05 DIAGNOSIS — I10 Essential (primary) hypertension: Secondary | ICD-10-CM | POA: Diagnosis not present

## 2018-09-05 DIAGNOSIS — I679 Cerebrovascular disease, unspecified: Secondary | ICD-10-CM | POA: Diagnosis not present

## 2018-09-05 DIAGNOSIS — I9589 Other hypotension: Secondary | ICD-10-CM | POA: Diagnosis not present

## 2018-09-05 DIAGNOSIS — R2689 Other abnormalities of gait and mobility: Secondary | ICD-10-CM | POA: Diagnosis not present

## 2018-09-05 DIAGNOSIS — R1312 Dysphagia, oropharyngeal phase: Secondary | ICD-10-CM | POA: Diagnosis not present

## 2018-09-05 DIAGNOSIS — G301 Alzheimer's disease with late onset: Secondary | ICD-10-CM | POA: Diagnosis not present

## 2018-09-06 DIAGNOSIS — I9589 Other hypotension: Secondary | ICD-10-CM | POA: Diagnosis not present

## 2018-09-06 DIAGNOSIS — I10 Essential (primary) hypertension: Secondary | ICD-10-CM | POA: Diagnosis not present

## 2018-09-06 DIAGNOSIS — R2689 Other abnormalities of gait and mobility: Secondary | ICD-10-CM | POA: Diagnosis not present

## 2018-09-06 DIAGNOSIS — R1312 Dysphagia, oropharyngeal phase: Secondary | ICD-10-CM | POA: Diagnosis not present

## 2018-09-06 DIAGNOSIS — G301 Alzheimer's disease with late onset: Secondary | ICD-10-CM | POA: Diagnosis not present

## 2018-09-06 DIAGNOSIS — I679 Cerebrovascular disease, unspecified: Secondary | ICD-10-CM | POA: Diagnosis not present

## 2018-09-07 DIAGNOSIS — I9589 Other hypotension: Secondary | ICD-10-CM | POA: Diagnosis not present

## 2018-09-07 DIAGNOSIS — I679 Cerebrovascular disease, unspecified: Secondary | ICD-10-CM | POA: Diagnosis not present

## 2018-09-07 DIAGNOSIS — G301 Alzheimer's disease with late onset: Secondary | ICD-10-CM | POA: Diagnosis not present

## 2018-09-07 DIAGNOSIS — I10 Essential (primary) hypertension: Secondary | ICD-10-CM | POA: Diagnosis not present

## 2018-09-07 DIAGNOSIS — R2689 Other abnormalities of gait and mobility: Secondary | ICD-10-CM | POA: Diagnosis not present

## 2018-09-07 DIAGNOSIS — R1312 Dysphagia, oropharyngeal phase: Secondary | ICD-10-CM | POA: Diagnosis not present

## 2018-09-08 DIAGNOSIS — R1312 Dysphagia, oropharyngeal phase: Secondary | ICD-10-CM | POA: Diagnosis not present

## 2018-09-08 DIAGNOSIS — I9589 Other hypotension: Secondary | ICD-10-CM | POA: Diagnosis not present

## 2018-09-08 DIAGNOSIS — G301 Alzheimer's disease with late onset: Secondary | ICD-10-CM | POA: Diagnosis not present

## 2018-09-08 DIAGNOSIS — I10 Essential (primary) hypertension: Secondary | ICD-10-CM | POA: Diagnosis not present

## 2018-09-08 DIAGNOSIS — I679 Cerebrovascular disease, unspecified: Secondary | ICD-10-CM | POA: Diagnosis not present

## 2018-09-08 DIAGNOSIS — R2689 Other abnormalities of gait and mobility: Secondary | ICD-10-CM | POA: Diagnosis not present

## 2018-09-09 DIAGNOSIS — M87076 Idiopathic aseptic necrosis of unspecified foot: Secondary | ICD-10-CM | POA: Diagnosis not present

## 2018-09-09 DIAGNOSIS — G301 Alzheimer's disease with late onset: Secondary | ICD-10-CM | POA: Diagnosis not present

## 2018-09-09 DIAGNOSIS — R7881 Bacteremia: Secondary | ICD-10-CM | POA: Diagnosis not present

## 2018-09-09 DIAGNOSIS — R41841 Cognitive communication deficit: Secondary | ICD-10-CM | POA: Diagnosis not present

## 2018-09-09 DIAGNOSIS — R1312 Dysphagia, oropharyngeal phase: Secondary | ICD-10-CM | POA: Diagnosis not present

## 2018-09-09 DIAGNOSIS — E1069 Type 1 diabetes mellitus with other specified complication: Secondary | ICD-10-CM | POA: Diagnosis not present

## 2018-09-09 DIAGNOSIS — R4182 Altered mental status, unspecified: Secondary | ICD-10-CM | POA: Diagnosis not present

## 2018-09-09 DIAGNOSIS — F319 Bipolar disorder, unspecified: Secondary | ICD-10-CM | POA: Diagnosis not present

## 2018-09-09 DIAGNOSIS — J189 Pneumonia, unspecified organism: Secondary | ICD-10-CM | POA: Diagnosis not present

## 2018-09-09 DIAGNOSIS — R2689 Other abnormalities of gait and mobility: Secondary | ICD-10-CM | POA: Diagnosis not present

## 2018-09-09 DIAGNOSIS — I679 Cerebrovascular disease, unspecified: Secondary | ICD-10-CM | POA: Diagnosis not present

## 2018-09-09 DIAGNOSIS — R197 Diarrhea, unspecified: Secondary | ICD-10-CM | POA: Diagnosis not present

## 2018-09-09 DIAGNOSIS — M6281 Muscle weakness (generalized): Secondary | ICD-10-CM | POA: Diagnosis not present

## 2018-09-09 DIAGNOSIS — Z96643 Presence of artificial hip joint, bilateral: Secondary | ICD-10-CM | POA: Diagnosis not present

## 2018-09-12 DIAGNOSIS — F319 Bipolar disorder, unspecified: Secondary | ICD-10-CM | POA: Diagnosis not present

## 2018-09-12 DIAGNOSIS — E1069 Type 1 diabetes mellitus with other specified complication: Secondary | ICD-10-CM | POA: Diagnosis not present

## 2018-09-12 DIAGNOSIS — R1312 Dysphagia, oropharyngeal phase: Secondary | ICD-10-CM | POA: Diagnosis not present

## 2018-09-12 DIAGNOSIS — G301 Alzheimer's disease with late onset: Secondary | ICD-10-CM | POA: Diagnosis not present

## 2018-09-12 DIAGNOSIS — I679 Cerebrovascular disease, unspecified: Secondary | ICD-10-CM | POA: Diagnosis not present

## 2018-09-12 DIAGNOSIS — R2689 Other abnormalities of gait and mobility: Secondary | ICD-10-CM | POA: Diagnosis not present

## 2018-09-13 DIAGNOSIS — E1069 Type 1 diabetes mellitus with other specified complication: Secondary | ICD-10-CM | POA: Diagnosis not present

## 2018-09-13 DIAGNOSIS — R2689 Other abnormalities of gait and mobility: Secondary | ICD-10-CM | POA: Diagnosis not present

## 2018-09-13 DIAGNOSIS — G301 Alzheimer's disease with late onset: Secondary | ICD-10-CM | POA: Diagnosis not present

## 2018-09-13 DIAGNOSIS — R1312 Dysphagia, oropharyngeal phase: Secondary | ICD-10-CM | POA: Diagnosis not present

## 2018-09-13 DIAGNOSIS — F319 Bipolar disorder, unspecified: Secondary | ICD-10-CM | POA: Diagnosis not present

## 2018-09-13 DIAGNOSIS — I679 Cerebrovascular disease, unspecified: Secondary | ICD-10-CM | POA: Diagnosis not present

## 2018-09-14 DIAGNOSIS — I679 Cerebrovascular disease, unspecified: Secondary | ICD-10-CM | POA: Diagnosis not present

## 2018-09-14 DIAGNOSIS — G301 Alzheimer's disease with late onset: Secondary | ICD-10-CM | POA: Diagnosis not present

## 2018-09-14 DIAGNOSIS — F319 Bipolar disorder, unspecified: Secondary | ICD-10-CM | POA: Diagnosis not present

## 2018-09-14 DIAGNOSIS — R1312 Dysphagia, oropharyngeal phase: Secondary | ICD-10-CM | POA: Diagnosis not present

## 2018-09-14 DIAGNOSIS — R2689 Other abnormalities of gait and mobility: Secondary | ICD-10-CM | POA: Diagnosis not present

## 2018-09-14 DIAGNOSIS — E1069 Type 1 diabetes mellitus with other specified complication: Secondary | ICD-10-CM | POA: Diagnosis not present

## 2018-09-15 DIAGNOSIS — R1312 Dysphagia, oropharyngeal phase: Secondary | ICD-10-CM | POA: Diagnosis not present

## 2018-09-15 DIAGNOSIS — R2689 Other abnormalities of gait and mobility: Secondary | ICD-10-CM | POA: Diagnosis not present

## 2018-09-15 DIAGNOSIS — E1069 Type 1 diabetes mellitus with other specified complication: Secondary | ICD-10-CM | POA: Diagnosis not present

## 2018-09-15 DIAGNOSIS — F319 Bipolar disorder, unspecified: Secondary | ICD-10-CM | POA: Diagnosis not present

## 2018-09-15 DIAGNOSIS — G301 Alzheimer's disease with late onset: Secondary | ICD-10-CM | POA: Diagnosis not present

## 2018-09-15 DIAGNOSIS — I679 Cerebrovascular disease, unspecified: Secondary | ICD-10-CM | POA: Diagnosis not present

## 2018-09-16 DIAGNOSIS — F319 Bipolar disorder, unspecified: Secondary | ICD-10-CM | POA: Diagnosis not present

## 2018-09-16 DIAGNOSIS — R1312 Dysphagia, oropharyngeal phase: Secondary | ICD-10-CM | POA: Diagnosis not present

## 2018-09-16 DIAGNOSIS — R2689 Other abnormalities of gait and mobility: Secondary | ICD-10-CM | POA: Diagnosis not present

## 2018-09-16 DIAGNOSIS — E1069 Type 1 diabetes mellitus with other specified complication: Secondary | ICD-10-CM | POA: Diagnosis not present

## 2018-09-16 DIAGNOSIS — I679 Cerebrovascular disease, unspecified: Secondary | ICD-10-CM | POA: Diagnosis not present

## 2018-09-16 DIAGNOSIS — G301 Alzheimer's disease with late onset: Secondary | ICD-10-CM | POA: Diagnosis not present

## 2018-09-19 DIAGNOSIS — G301 Alzheimer's disease with late onset: Secondary | ICD-10-CM | POA: Diagnosis not present

## 2018-09-19 DIAGNOSIS — R1312 Dysphagia, oropharyngeal phase: Secondary | ICD-10-CM | POA: Diagnosis not present

## 2018-09-19 DIAGNOSIS — R2689 Other abnormalities of gait and mobility: Secondary | ICD-10-CM | POA: Diagnosis not present

## 2018-09-19 DIAGNOSIS — I679 Cerebrovascular disease, unspecified: Secondary | ICD-10-CM | POA: Diagnosis not present

## 2018-09-19 DIAGNOSIS — E1069 Type 1 diabetes mellitus with other specified complication: Secondary | ICD-10-CM | POA: Diagnosis not present

## 2018-09-19 DIAGNOSIS — F319 Bipolar disorder, unspecified: Secondary | ICD-10-CM | POA: Diagnosis not present

## 2018-09-20 DIAGNOSIS — I679 Cerebrovascular disease, unspecified: Secondary | ICD-10-CM | POA: Diagnosis not present

## 2018-09-20 DIAGNOSIS — F319 Bipolar disorder, unspecified: Secondary | ICD-10-CM | POA: Diagnosis not present

## 2018-09-20 DIAGNOSIS — R2689 Other abnormalities of gait and mobility: Secondary | ICD-10-CM | POA: Diagnosis not present

## 2018-09-20 DIAGNOSIS — I10 Essential (primary) hypertension: Secondary | ICD-10-CM | POA: Diagnosis not present

## 2018-09-20 DIAGNOSIS — H409 Unspecified glaucoma: Secondary | ICD-10-CM | POA: Diagnosis not present

## 2018-09-20 DIAGNOSIS — R569 Unspecified convulsions: Secondary | ICD-10-CM | POA: Diagnosis not present

## 2018-09-20 DIAGNOSIS — R1312 Dysphagia, oropharyngeal phase: Secondary | ICD-10-CM | POA: Diagnosis not present

## 2018-09-20 DIAGNOSIS — E1069 Type 1 diabetes mellitus with other specified complication: Secondary | ICD-10-CM | POA: Diagnosis not present

## 2018-09-20 DIAGNOSIS — G301 Alzheimer's disease with late onset: Secondary | ICD-10-CM | POA: Diagnosis not present

## 2018-09-20 DIAGNOSIS — F79 Unspecified intellectual disabilities: Secondary | ICD-10-CM | POA: Diagnosis not present

## 2018-09-21 DIAGNOSIS — R1312 Dysphagia, oropharyngeal phase: Secondary | ICD-10-CM | POA: Diagnosis not present

## 2018-09-21 DIAGNOSIS — F319 Bipolar disorder, unspecified: Secondary | ICD-10-CM | POA: Diagnosis not present

## 2018-09-21 DIAGNOSIS — I679 Cerebrovascular disease, unspecified: Secondary | ICD-10-CM | POA: Diagnosis not present

## 2018-09-21 DIAGNOSIS — G301 Alzheimer's disease with late onset: Secondary | ICD-10-CM | POA: Diagnosis not present

## 2018-09-21 DIAGNOSIS — R2689 Other abnormalities of gait and mobility: Secondary | ICD-10-CM | POA: Diagnosis not present

## 2018-09-21 DIAGNOSIS — E1069 Type 1 diabetes mellitus with other specified complication: Secondary | ICD-10-CM | POA: Diagnosis not present

## 2018-09-22 DIAGNOSIS — R2689 Other abnormalities of gait and mobility: Secondary | ICD-10-CM | POA: Diagnosis not present

## 2018-09-22 DIAGNOSIS — I10 Essential (primary) hypertension: Secondary | ICD-10-CM | POA: Diagnosis not present

## 2018-09-22 DIAGNOSIS — H409 Unspecified glaucoma: Secondary | ICD-10-CM | POA: Diagnosis not present

## 2018-09-22 DIAGNOSIS — F79 Unspecified intellectual disabilities: Secondary | ICD-10-CM | POA: Diagnosis not present

## 2018-09-22 DIAGNOSIS — E1069 Type 1 diabetes mellitus with other specified complication: Secondary | ICD-10-CM | POA: Diagnosis not present

## 2018-09-22 DIAGNOSIS — G301 Alzheimer's disease with late onset: Secondary | ICD-10-CM | POA: Diagnosis not present

## 2018-09-22 DIAGNOSIS — R1312 Dysphagia, oropharyngeal phase: Secondary | ICD-10-CM | POA: Diagnosis not present

## 2018-09-22 DIAGNOSIS — F319 Bipolar disorder, unspecified: Secondary | ICD-10-CM | POA: Diagnosis not present

## 2018-09-22 DIAGNOSIS — I679 Cerebrovascular disease, unspecified: Secondary | ICD-10-CM | POA: Diagnosis not present

## 2018-09-22 DIAGNOSIS — R569 Unspecified convulsions: Secondary | ICD-10-CM | POA: Diagnosis not present

## 2018-09-26 DIAGNOSIS — E1069 Type 1 diabetes mellitus with other specified complication: Secondary | ICD-10-CM | POA: Diagnosis not present

## 2018-09-26 DIAGNOSIS — I679 Cerebrovascular disease, unspecified: Secondary | ICD-10-CM | POA: Diagnosis not present

## 2018-09-26 DIAGNOSIS — F319 Bipolar disorder, unspecified: Secondary | ICD-10-CM | POA: Diagnosis not present

## 2018-09-26 DIAGNOSIS — G301 Alzheimer's disease with late onset: Secondary | ICD-10-CM | POA: Diagnosis not present

## 2018-09-26 DIAGNOSIS — R2689 Other abnormalities of gait and mobility: Secondary | ICD-10-CM | POA: Diagnosis not present

## 2018-09-26 DIAGNOSIS — R1312 Dysphagia, oropharyngeal phase: Secondary | ICD-10-CM | POA: Diagnosis not present

## 2018-09-27 DIAGNOSIS — R1312 Dysphagia, oropharyngeal phase: Secondary | ICD-10-CM | POA: Diagnosis not present

## 2018-09-27 DIAGNOSIS — E1069 Type 1 diabetes mellitus with other specified complication: Secondary | ICD-10-CM | POA: Diagnosis not present

## 2018-09-27 DIAGNOSIS — G301 Alzheimer's disease with late onset: Secondary | ICD-10-CM | POA: Diagnosis not present

## 2018-09-27 DIAGNOSIS — F319 Bipolar disorder, unspecified: Secondary | ICD-10-CM | POA: Diagnosis not present

## 2018-09-27 DIAGNOSIS — I679 Cerebrovascular disease, unspecified: Secondary | ICD-10-CM | POA: Diagnosis not present

## 2018-09-27 DIAGNOSIS — R2689 Other abnormalities of gait and mobility: Secondary | ICD-10-CM | POA: Diagnosis not present

## 2018-09-28 DIAGNOSIS — E1069 Type 1 diabetes mellitus with other specified complication: Secondary | ICD-10-CM | POA: Diagnosis not present

## 2018-09-28 DIAGNOSIS — R2689 Other abnormalities of gait and mobility: Secondary | ICD-10-CM | POA: Diagnosis not present

## 2018-09-28 DIAGNOSIS — G301 Alzheimer's disease with late onset: Secondary | ICD-10-CM | POA: Diagnosis not present

## 2018-09-28 DIAGNOSIS — R1312 Dysphagia, oropharyngeal phase: Secondary | ICD-10-CM | POA: Diagnosis not present

## 2018-09-28 DIAGNOSIS — I679 Cerebrovascular disease, unspecified: Secondary | ICD-10-CM | POA: Diagnosis not present

## 2018-09-28 DIAGNOSIS — F319 Bipolar disorder, unspecified: Secondary | ICD-10-CM | POA: Diagnosis not present

## 2018-10-11 DIAGNOSIS — B351 Tinea unguium: Secondary | ICD-10-CM | POA: Diagnosis not present

## 2018-10-11 DIAGNOSIS — I739 Peripheral vascular disease, unspecified: Secondary | ICD-10-CM | POA: Diagnosis not present

## 2018-10-11 DIAGNOSIS — M2010 Hallux valgus (acquired), unspecified foot: Secondary | ICD-10-CM | POA: Diagnosis not present

## 2018-10-11 DIAGNOSIS — L603 Nail dystrophy: Secondary | ICD-10-CM | POA: Diagnosis not present

## 2018-10-11 DIAGNOSIS — L853 Xerosis cutis: Secondary | ICD-10-CM | POA: Diagnosis not present

## 2018-10-11 DIAGNOSIS — Q845 Enlarged and hypertrophic nails: Secondary | ICD-10-CM | POA: Diagnosis not present

## 2018-10-11 DIAGNOSIS — R600 Localized edema: Secondary | ICD-10-CM | POA: Diagnosis not present

## 2018-10-18 DIAGNOSIS — G459 Transient cerebral ischemic attack, unspecified: Secondary | ICD-10-CM | POA: Diagnosis not present

## 2018-10-18 DIAGNOSIS — N400 Enlarged prostate without lower urinary tract symptoms: Secondary | ICD-10-CM | POA: Diagnosis not present

## 2018-10-18 DIAGNOSIS — E538 Deficiency of other specified B group vitamins: Secondary | ICD-10-CM | POA: Diagnosis not present

## 2018-10-18 DIAGNOSIS — I10 Essential (primary) hypertension: Secondary | ICD-10-CM | POA: Diagnosis not present

## 2018-10-19 DIAGNOSIS — E559 Vitamin D deficiency, unspecified: Secondary | ICD-10-CM | POA: Diagnosis not present

## 2018-10-19 DIAGNOSIS — E039 Hypothyroidism, unspecified: Secondary | ICD-10-CM | POA: Diagnosis not present

## 2018-10-19 DIAGNOSIS — E785 Hyperlipidemia, unspecified: Secondary | ICD-10-CM | POA: Diagnosis not present

## 2018-10-19 DIAGNOSIS — D649 Anemia, unspecified: Secondary | ICD-10-CM | POA: Diagnosis not present

## 2018-10-19 DIAGNOSIS — E119 Type 2 diabetes mellitus without complications: Secondary | ICD-10-CM | POA: Diagnosis not present

## 2018-10-20 DIAGNOSIS — D291 Benign neoplasm of prostate: Secondary | ICD-10-CM | POA: Diagnosis not present

## 2018-10-20 DIAGNOSIS — N400 Enlarged prostate without lower urinary tract symptoms: Secondary | ICD-10-CM | POA: Diagnosis not present

## 2018-10-20 DIAGNOSIS — I10 Essential (primary) hypertension: Secondary | ICD-10-CM | POA: Diagnosis not present

## 2018-10-20 DIAGNOSIS — R569 Unspecified convulsions: Secondary | ICD-10-CM | POA: Diagnosis not present

## 2018-10-21 DIAGNOSIS — H401131 Primary open-angle glaucoma, bilateral, mild stage: Secondary | ICD-10-CM | POA: Diagnosis not present

## 2018-10-21 DIAGNOSIS — H2513 Age-related nuclear cataract, bilateral: Secondary | ICD-10-CM | POA: Diagnosis not present

## 2018-10-21 DIAGNOSIS — H524 Presbyopia: Secondary | ICD-10-CM | POA: Diagnosis not present

## 2018-11-15 DIAGNOSIS — D291 Benign neoplasm of prostate: Secondary | ICD-10-CM | POA: Diagnosis not present

## 2018-11-15 DIAGNOSIS — I10 Essential (primary) hypertension: Secondary | ICD-10-CM | POA: Diagnosis not present

## 2018-11-15 DIAGNOSIS — N400 Enlarged prostate without lower urinary tract symptoms: Secondary | ICD-10-CM | POA: Diagnosis not present

## 2018-11-15 DIAGNOSIS — R569 Unspecified convulsions: Secondary | ICD-10-CM | POA: Diagnosis not present

## 2018-11-24 DIAGNOSIS — R569 Unspecified convulsions: Secondary | ICD-10-CM | POA: Diagnosis not present

## 2018-11-24 DIAGNOSIS — D291 Benign neoplasm of prostate: Secondary | ICD-10-CM | POA: Diagnosis not present

## 2018-11-24 DIAGNOSIS — N400 Enlarged prostate without lower urinary tract symptoms: Secondary | ICD-10-CM | POA: Diagnosis not present

## 2018-11-24 DIAGNOSIS — I10 Essential (primary) hypertension: Secondary | ICD-10-CM | POA: Diagnosis not present

## 2018-12-13 DIAGNOSIS — R569 Unspecified convulsions: Secondary | ICD-10-CM | POA: Diagnosis not present

## 2018-12-13 DIAGNOSIS — N400 Enlarged prostate without lower urinary tract symptoms: Secondary | ICD-10-CM | POA: Diagnosis not present

## 2018-12-13 DIAGNOSIS — D291 Benign neoplasm of prostate: Secondary | ICD-10-CM | POA: Diagnosis not present

## 2018-12-13 DIAGNOSIS — I10 Essential (primary) hypertension: Secondary | ICD-10-CM | POA: Diagnosis not present

## 2018-12-22 DIAGNOSIS — R569 Unspecified convulsions: Secondary | ICD-10-CM | POA: Diagnosis not present

## 2018-12-22 DIAGNOSIS — I10 Essential (primary) hypertension: Secondary | ICD-10-CM | POA: Diagnosis not present

## 2018-12-22 DIAGNOSIS — D291 Benign neoplasm of prostate: Secondary | ICD-10-CM | POA: Diagnosis not present

## 2018-12-22 DIAGNOSIS — N400 Enlarged prostate without lower urinary tract symptoms: Secondary | ICD-10-CM | POA: Diagnosis not present

## 2019-01-17 DIAGNOSIS — D291 Benign neoplasm of prostate: Secondary | ICD-10-CM | POA: Diagnosis not present

## 2019-01-17 DIAGNOSIS — N400 Enlarged prostate without lower urinary tract symptoms: Secondary | ICD-10-CM | POA: Diagnosis not present

## 2019-01-17 DIAGNOSIS — R569 Unspecified convulsions: Secondary | ICD-10-CM | POA: Diagnosis not present

## 2019-01-17 DIAGNOSIS — I10 Essential (primary) hypertension: Secondary | ICD-10-CM | POA: Diagnosis not present

## 2019-01-19 DIAGNOSIS — D291 Benign neoplasm of prostate: Secondary | ICD-10-CM | POA: Diagnosis not present

## 2019-01-19 DIAGNOSIS — I10 Essential (primary) hypertension: Secondary | ICD-10-CM | POA: Diagnosis not present

## 2019-01-19 DIAGNOSIS — N400 Enlarged prostate without lower urinary tract symptoms: Secondary | ICD-10-CM | POA: Diagnosis not present

## 2019-01-19 DIAGNOSIS — R569 Unspecified convulsions: Secondary | ICD-10-CM | POA: Diagnosis not present

## 2019-02-06 DIAGNOSIS — R278 Other lack of coordination: Secondary | ICD-10-CM | POA: Diagnosis not present

## 2019-02-06 DIAGNOSIS — M6281 Muscle weakness (generalized): Secondary | ICD-10-CM | POA: Diagnosis not present

## 2019-02-06 DIAGNOSIS — G301 Alzheimer's disease with late onset: Secondary | ICD-10-CM | POA: Diagnosis not present

## 2019-02-06 DIAGNOSIS — R2681 Unsteadiness on feet: Secondary | ICD-10-CM | POA: Diagnosis not present

## 2019-02-07 DIAGNOSIS — R2681 Unsteadiness on feet: Secondary | ICD-10-CM | POA: Diagnosis not present

## 2019-02-07 DIAGNOSIS — G301 Alzheimer's disease with late onset: Secondary | ICD-10-CM | POA: Diagnosis not present

## 2019-02-07 DIAGNOSIS — M6281 Muscle weakness (generalized): Secondary | ICD-10-CM | POA: Diagnosis not present

## 2019-02-07 DIAGNOSIS — R278 Other lack of coordination: Secondary | ICD-10-CM | POA: Diagnosis not present

## 2019-02-08 DIAGNOSIS — R41841 Cognitive communication deficit: Secondary | ICD-10-CM | POA: Diagnosis not present

## 2019-02-08 DIAGNOSIS — G301 Alzheimer's disease with late onset: Secondary | ICD-10-CM | POA: Diagnosis not present

## 2019-02-08 DIAGNOSIS — M6281 Muscle weakness (generalized): Secondary | ICD-10-CM | POA: Diagnosis not present

## 2019-02-08 DIAGNOSIS — I639 Cerebral infarction, unspecified: Secondary | ICD-10-CM | POA: Diagnosis not present

## 2019-02-08 DIAGNOSIS — R2681 Unsteadiness on feet: Secondary | ICD-10-CM | POA: Diagnosis not present

## 2019-02-08 DIAGNOSIS — Z8673 Personal history of transient ischemic attack (TIA), and cerebral infarction without residual deficits: Secondary | ICD-10-CM | POA: Diagnosis not present

## 2019-02-08 DIAGNOSIS — R278 Other lack of coordination: Secondary | ICD-10-CM | POA: Diagnosis not present

## 2019-02-09 DIAGNOSIS — G301 Alzheimer's disease with late onset: Secondary | ICD-10-CM | POA: Diagnosis not present

## 2019-02-09 DIAGNOSIS — M6281 Muscle weakness (generalized): Secondary | ICD-10-CM | POA: Diagnosis not present

## 2019-02-09 DIAGNOSIS — I639 Cerebral infarction, unspecified: Secondary | ICD-10-CM | POA: Diagnosis not present

## 2019-02-09 DIAGNOSIS — R41841 Cognitive communication deficit: Secondary | ICD-10-CM | POA: Diagnosis not present

## 2019-02-09 DIAGNOSIS — R2681 Unsteadiness on feet: Secondary | ICD-10-CM | POA: Diagnosis not present

## 2019-02-09 DIAGNOSIS — R278 Other lack of coordination: Secondary | ICD-10-CM | POA: Diagnosis not present

## 2019-02-10 DIAGNOSIS — I639 Cerebral infarction, unspecified: Secondary | ICD-10-CM | POA: Diagnosis not present

## 2019-02-10 DIAGNOSIS — R2681 Unsteadiness on feet: Secondary | ICD-10-CM | POA: Diagnosis not present

## 2019-02-10 DIAGNOSIS — R278 Other lack of coordination: Secondary | ICD-10-CM | POA: Diagnosis not present

## 2019-02-10 DIAGNOSIS — M6281 Muscle weakness (generalized): Secondary | ICD-10-CM | POA: Diagnosis not present

## 2019-02-10 DIAGNOSIS — G301 Alzheimer's disease with late onset: Secondary | ICD-10-CM | POA: Diagnosis not present

## 2019-02-10 DIAGNOSIS — R41841 Cognitive communication deficit: Secondary | ICD-10-CM | POA: Diagnosis not present

## 2019-02-13 DIAGNOSIS — R41841 Cognitive communication deficit: Secondary | ICD-10-CM | POA: Diagnosis not present

## 2019-02-13 DIAGNOSIS — R278 Other lack of coordination: Secondary | ICD-10-CM | POA: Diagnosis not present

## 2019-02-13 DIAGNOSIS — M6281 Muscle weakness (generalized): Secondary | ICD-10-CM | POA: Diagnosis not present

## 2019-02-13 DIAGNOSIS — I639 Cerebral infarction, unspecified: Secondary | ICD-10-CM | POA: Diagnosis not present

## 2019-02-13 DIAGNOSIS — G301 Alzheimer's disease with late onset: Secondary | ICD-10-CM | POA: Diagnosis not present

## 2019-02-13 DIAGNOSIS — R2681 Unsteadiness on feet: Secondary | ICD-10-CM | POA: Diagnosis not present

## 2019-02-14 DIAGNOSIS — R278 Other lack of coordination: Secondary | ICD-10-CM | POA: Diagnosis not present

## 2019-02-14 DIAGNOSIS — I639 Cerebral infarction, unspecified: Secondary | ICD-10-CM | POA: Diagnosis not present

## 2019-02-14 DIAGNOSIS — R2681 Unsteadiness on feet: Secondary | ICD-10-CM | POA: Diagnosis not present

## 2019-02-14 DIAGNOSIS — M6281 Muscle weakness (generalized): Secondary | ICD-10-CM | POA: Diagnosis not present

## 2019-02-14 DIAGNOSIS — R41841 Cognitive communication deficit: Secondary | ICD-10-CM | POA: Diagnosis not present

## 2019-02-14 DIAGNOSIS — G301 Alzheimer's disease with late onset: Secondary | ICD-10-CM | POA: Diagnosis not present

## 2019-02-15 DIAGNOSIS — R41841 Cognitive communication deficit: Secondary | ICD-10-CM | POA: Diagnosis not present

## 2019-02-15 DIAGNOSIS — R2681 Unsteadiness on feet: Secondary | ICD-10-CM | POA: Diagnosis not present

## 2019-02-15 DIAGNOSIS — M6281 Muscle weakness (generalized): Secondary | ICD-10-CM | POA: Diagnosis not present

## 2019-02-15 DIAGNOSIS — R278 Other lack of coordination: Secondary | ICD-10-CM | POA: Diagnosis not present

## 2019-02-15 DIAGNOSIS — G301 Alzheimer's disease with late onset: Secondary | ICD-10-CM | POA: Diagnosis not present

## 2019-02-15 DIAGNOSIS — I639 Cerebral infarction, unspecified: Secondary | ICD-10-CM | POA: Diagnosis not present

## 2019-02-16 DIAGNOSIS — M6281 Muscle weakness (generalized): Secondary | ICD-10-CM | POA: Diagnosis not present

## 2019-02-16 DIAGNOSIS — R41841 Cognitive communication deficit: Secondary | ICD-10-CM | POA: Diagnosis not present

## 2019-02-16 DIAGNOSIS — G301 Alzheimer's disease with late onset: Secondary | ICD-10-CM | POA: Diagnosis not present

## 2019-02-16 DIAGNOSIS — R278 Other lack of coordination: Secondary | ICD-10-CM | POA: Diagnosis not present

## 2019-02-16 DIAGNOSIS — R2681 Unsteadiness on feet: Secondary | ICD-10-CM | POA: Diagnosis not present

## 2019-02-16 DIAGNOSIS — I639 Cerebral infarction, unspecified: Secondary | ICD-10-CM | POA: Diagnosis not present

## 2019-02-17 DIAGNOSIS — M6281 Muscle weakness (generalized): Secondary | ICD-10-CM | POA: Diagnosis not present

## 2019-02-17 DIAGNOSIS — R2681 Unsteadiness on feet: Secondary | ICD-10-CM | POA: Diagnosis not present

## 2019-02-17 DIAGNOSIS — R41841 Cognitive communication deficit: Secondary | ICD-10-CM | POA: Diagnosis not present

## 2019-02-17 DIAGNOSIS — G301 Alzheimer's disease with late onset: Secondary | ICD-10-CM | POA: Diagnosis not present

## 2019-02-17 DIAGNOSIS — R278 Other lack of coordination: Secondary | ICD-10-CM | POA: Diagnosis not present

## 2019-02-17 DIAGNOSIS — I639 Cerebral infarction, unspecified: Secondary | ICD-10-CM | POA: Diagnosis not present

## 2019-02-20 DIAGNOSIS — I639 Cerebral infarction, unspecified: Secondary | ICD-10-CM | POA: Diagnosis not present

## 2019-02-20 DIAGNOSIS — G301 Alzheimer's disease with late onset: Secondary | ICD-10-CM | POA: Diagnosis not present

## 2019-02-20 DIAGNOSIS — M6281 Muscle weakness (generalized): Secondary | ICD-10-CM | POA: Diagnosis not present

## 2019-02-20 DIAGNOSIS — R41841 Cognitive communication deficit: Secondary | ICD-10-CM | POA: Diagnosis not present

## 2019-02-20 DIAGNOSIS — R278 Other lack of coordination: Secondary | ICD-10-CM | POA: Diagnosis not present

## 2019-02-20 DIAGNOSIS — R2681 Unsteadiness on feet: Secondary | ICD-10-CM | POA: Diagnosis not present

## 2019-02-21 DIAGNOSIS — R2681 Unsteadiness on feet: Secondary | ICD-10-CM | POA: Diagnosis not present

## 2019-02-21 DIAGNOSIS — G301 Alzheimer's disease with late onset: Secondary | ICD-10-CM | POA: Diagnosis not present

## 2019-02-21 DIAGNOSIS — R41841 Cognitive communication deficit: Secondary | ICD-10-CM | POA: Diagnosis not present

## 2019-02-21 DIAGNOSIS — R278 Other lack of coordination: Secondary | ICD-10-CM | POA: Diagnosis not present

## 2019-02-21 DIAGNOSIS — I639 Cerebral infarction, unspecified: Secondary | ICD-10-CM | POA: Diagnosis not present

## 2019-02-21 DIAGNOSIS — M6281 Muscle weakness (generalized): Secondary | ICD-10-CM | POA: Diagnosis not present

## 2019-02-22 DIAGNOSIS — R41841 Cognitive communication deficit: Secondary | ICD-10-CM | POA: Diagnosis not present

## 2019-02-22 DIAGNOSIS — R2681 Unsteadiness on feet: Secondary | ICD-10-CM | POA: Diagnosis not present

## 2019-02-22 DIAGNOSIS — I639 Cerebral infarction, unspecified: Secondary | ICD-10-CM | POA: Diagnosis not present

## 2019-02-22 DIAGNOSIS — M6281 Muscle weakness (generalized): Secondary | ICD-10-CM | POA: Diagnosis not present

## 2019-02-22 DIAGNOSIS — G301 Alzheimer's disease with late onset: Secondary | ICD-10-CM | POA: Diagnosis not present

## 2019-02-22 DIAGNOSIS — R278 Other lack of coordination: Secondary | ICD-10-CM | POA: Diagnosis not present

## 2019-02-23 DIAGNOSIS — R2681 Unsteadiness on feet: Secondary | ICD-10-CM | POA: Diagnosis not present

## 2019-02-23 DIAGNOSIS — R278 Other lack of coordination: Secondary | ICD-10-CM | POA: Diagnosis not present

## 2019-02-23 DIAGNOSIS — R41841 Cognitive communication deficit: Secondary | ICD-10-CM | POA: Diagnosis not present

## 2019-02-23 DIAGNOSIS — I639 Cerebral infarction, unspecified: Secondary | ICD-10-CM | POA: Diagnosis not present

## 2019-02-23 DIAGNOSIS — G301 Alzheimer's disease with late onset: Secondary | ICD-10-CM | POA: Diagnosis not present

## 2019-02-23 DIAGNOSIS — M6281 Muscle weakness (generalized): Secondary | ICD-10-CM | POA: Diagnosis not present

## 2019-02-24 DIAGNOSIS — M6281 Muscle weakness (generalized): Secondary | ICD-10-CM | POA: Diagnosis not present

## 2019-02-24 DIAGNOSIS — I639 Cerebral infarction, unspecified: Secondary | ICD-10-CM | POA: Diagnosis not present

## 2019-02-24 DIAGNOSIS — G301 Alzheimer's disease with late onset: Secondary | ICD-10-CM | POA: Diagnosis not present

## 2019-02-24 DIAGNOSIS — R278 Other lack of coordination: Secondary | ICD-10-CM | POA: Diagnosis not present

## 2019-02-24 DIAGNOSIS — R41841 Cognitive communication deficit: Secondary | ICD-10-CM | POA: Diagnosis not present

## 2019-02-24 DIAGNOSIS — R2681 Unsteadiness on feet: Secondary | ICD-10-CM | POA: Diagnosis not present

## 2019-02-27 DIAGNOSIS — R2681 Unsteadiness on feet: Secondary | ICD-10-CM | POA: Diagnosis not present

## 2019-02-27 DIAGNOSIS — R278 Other lack of coordination: Secondary | ICD-10-CM | POA: Diagnosis not present

## 2019-02-27 DIAGNOSIS — I639 Cerebral infarction, unspecified: Secondary | ICD-10-CM | POA: Diagnosis not present

## 2019-02-27 DIAGNOSIS — M6281 Muscle weakness (generalized): Secondary | ICD-10-CM | POA: Diagnosis not present

## 2019-02-27 DIAGNOSIS — G301 Alzheimer's disease with late onset: Secondary | ICD-10-CM | POA: Diagnosis not present

## 2019-02-27 DIAGNOSIS — R41841 Cognitive communication deficit: Secondary | ICD-10-CM | POA: Diagnosis not present

## 2019-02-28 DIAGNOSIS — R41841 Cognitive communication deficit: Secondary | ICD-10-CM | POA: Diagnosis not present

## 2019-02-28 DIAGNOSIS — R278 Other lack of coordination: Secondary | ICD-10-CM | POA: Diagnosis not present

## 2019-02-28 DIAGNOSIS — M6281 Muscle weakness (generalized): Secondary | ICD-10-CM | POA: Diagnosis not present

## 2019-02-28 DIAGNOSIS — G301 Alzheimer's disease with late onset: Secondary | ICD-10-CM | POA: Diagnosis not present

## 2019-02-28 DIAGNOSIS — R2681 Unsteadiness on feet: Secondary | ICD-10-CM | POA: Diagnosis not present

## 2019-02-28 DIAGNOSIS — I639 Cerebral infarction, unspecified: Secondary | ICD-10-CM | POA: Diagnosis not present

## 2019-03-01 DIAGNOSIS — I639 Cerebral infarction, unspecified: Secondary | ICD-10-CM | POA: Diagnosis not present

## 2019-03-01 DIAGNOSIS — M6281 Muscle weakness (generalized): Secondary | ICD-10-CM | POA: Diagnosis not present

## 2019-03-01 DIAGNOSIS — G301 Alzheimer's disease with late onset: Secondary | ICD-10-CM | POA: Diagnosis not present

## 2019-03-01 DIAGNOSIS — R2681 Unsteadiness on feet: Secondary | ICD-10-CM | POA: Diagnosis not present

## 2019-03-01 DIAGNOSIS — R278 Other lack of coordination: Secondary | ICD-10-CM | POA: Diagnosis not present

## 2019-03-01 DIAGNOSIS — R41841 Cognitive communication deficit: Secondary | ICD-10-CM | POA: Diagnosis not present

## 2019-03-02 DIAGNOSIS — R41841 Cognitive communication deficit: Secondary | ICD-10-CM | POA: Diagnosis not present

## 2019-03-02 DIAGNOSIS — R278 Other lack of coordination: Secondary | ICD-10-CM | POA: Diagnosis not present

## 2019-03-02 DIAGNOSIS — I639 Cerebral infarction, unspecified: Secondary | ICD-10-CM | POA: Diagnosis not present

## 2019-03-02 DIAGNOSIS — R2681 Unsteadiness on feet: Secondary | ICD-10-CM | POA: Diagnosis not present

## 2019-03-02 DIAGNOSIS — G301 Alzheimer's disease with late onset: Secondary | ICD-10-CM | POA: Diagnosis not present

## 2019-03-02 DIAGNOSIS — M6281 Muscle weakness (generalized): Secondary | ICD-10-CM | POA: Diagnosis not present

## 2019-03-03 DIAGNOSIS — G301 Alzheimer's disease with late onset: Secondary | ICD-10-CM | POA: Diagnosis not present

## 2019-03-03 DIAGNOSIS — M6281 Muscle weakness (generalized): Secondary | ICD-10-CM | POA: Diagnosis not present

## 2019-03-03 DIAGNOSIS — I639 Cerebral infarction, unspecified: Secondary | ICD-10-CM | POA: Diagnosis not present

## 2019-03-03 DIAGNOSIS — R2681 Unsteadiness on feet: Secondary | ICD-10-CM | POA: Diagnosis not present

## 2019-03-03 DIAGNOSIS — I679 Cerebrovascular disease, unspecified: Secondary | ICD-10-CM | POA: Diagnosis not present

## 2019-03-03 DIAGNOSIS — H409 Unspecified glaucoma: Secondary | ICD-10-CM | POA: Diagnosis not present

## 2019-03-03 DIAGNOSIS — R278 Other lack of coordination: Secondary | ICD-10-CM | POA: Diagnosis not present

## 2019-03-03 DIAGNOSIS — F0280 Dementia in other diseases classified elsewhere without behavioral disturbance: Secondary | ICD-10-CM | POA: Diagnosis not present

## 2019-03-03 DIAGNOSIS — R41841 Cognitive communication deficit: Secondary | ICD-10-CM | POA: Diagnosis not present

## 2019-03-06 DIAGNOSIS — R41841 Cognitive communication deficit: Secondary | ICD-10-CM | POA: Diagnosis not present

## 2019-03-06 DIAGNOSIS — R278 Other lack of coordination: Secondary | ICD-10-CM | POA: Diagnosis not present

## 2019-03-06 DIAGNOSIS — R2681 Unsteadiness on feet: Secondary | ICD-10-CM | POA: Diagnosis not present

## 2019-03-06 DIAGNOSIS — M6281 Muscle weakness (generalized): Secondary | ICD-10-CM | POA: Diagnosis not present

## 2019-03-06 DIAGNOSIS — G301 Alzheimer's disease with late onset: Secondary | ICD-10-CM | POA: Diagnosis not present

## 2019-03-06 DIAGNOSIS — I639 Cerebral infarction, unspecified: Secondary | ICD-10-CM | POA: Diagnosis not present

## 2019-03-07 DIAGNOSIS — R41841 Cognitive communication deficit: Secondary | ICD-10-CM | POA: Diagnosis not present

## 2019-03-07 DIAGNOSIS — I639 Cerebral infarction, unspecified: Secondary | ICD-10-CM | POA: Diagnosis not present

## 2019-03-07 DIAGNOSIS — M6281 Muscle weakness (generalized): Secondary | ICD-10-CM | POA: Diagnosis not present

## 2019-03-07 DIAGNOSIS — R278 Other lack of coordination: Secondary | ICD-10-CM | POA: Diagnosis not present

## 2019-03-07 DIAGNOSIS — G301 Alzheimer's disease with late onset: Secondary | ICD-10-CM | POA: Diagnosis not present

## 2019-03-07 DIAGNOSIS — R2681 Unsteadiness on feet: Secondary | ICD-10-CM | POA: Diagnosis not present

## 2019-03-29 DIAGNOSIS — I10 Essential (primary) hypertension: Secondary | ICD-10-CM | POA: Diagnosis not present

## 2019-03-29 DIAGNOSIS — D649 Anemia, unspecified: Secondary | ICD-10-CM | POA: Diagnosis not present

## 2019-03-29 DIAGNOSIS — N400 Enlarged prostate without lower urinary tract symptoms: Secondary | ICD-10-CM | POA: Diagnosis not present

## 2019-03-31 DIAGNOSIS — G301 Alzheimer's disease with late onset: Secondary | ICD-10-CM | POA: Diagnosis not present

## 2019-03-31 DIAGNOSIS — F0280 Dementia in other diseases classified elsewhere without behavioral disturbance: Secondary | ICD-10-CM | POA: Diagnosis not present

## 2019-03-31 DIAGNOSIS — H409 Unspecified glaucoma: Secondary | ICD-10-CM | POA: Diagnosis not present

## 2019-03-31 DIAGNOSIS — I679 Cerebrovascular disease, unspecified: Secondary | ICD-10-CM | POA: Diagnosis not present

## 2019-05-01 DIAGNOSIS — F0280 Dementia in other diseases classified elsewhere without behavioral disturbance: Secondary | ICD-10-CM | POA: Diagnosis not present

## 2019-05-01 DIAGNOSIS — H409 Unspecified glaucoma: Secondary | ICD-10-CM | POA: Diagnosis not present

## 2019-05-01 DIAGNOSIS — I679 Cerebrovascular disease, unspecified: Secondary | ICD-10-CM | POA: Diagnosis not present

## 2019-05-01 DIAGNOSIS — G301 Alzheimer's disease with late onset: Secondary | ICD-10-CM | POA: Diagnosis not present

## 2019-05-02 DIAGNOSIS — M2450 Contracture, unspecified joint: Secondary | ICD-10-CM | POA: Diagnosis not present

## 2019-05-02 DIAGNOSIS — M6281 Muscle weakness (generalized): Secondary | ICD-10-CM | POA: Diagnosis not present

## 2019-05-02 DIAGNOSIS — R2689 Other abnormalities of gait and mobility: Secondary | ICD-10-CM | POA: Diagnosis not present

## 2019-05-02 DIAGNOSIS — I679 Cerebrovascular disease, unspecified: Secondary | ICD-10-CM | POA: Diagnosis not present

## 2019-05-02 DIAGNOSIS — F0390 Unspecified dementia without behavioral disturbance: Secondary | ICD-10-CM | POA: Diagnosis not present

## 2019-05-03 DIAGNOSIS — F0390 Unspecified dementia without behavioral disturbance: Secondary | ICD-10-CM | POA: Diagnosis not present

## 2019-05-03 DIAGNOSIS — R2689 Other abnormalities of gait and mobility: Secondary | ICD-10-CM | POA: Diagnosis not present

## 2019-05-03 DIAGNOSIS — M2450 Contracture, unspecified joint: Secondary | ICD-10-CM | POA: Diagnosis not present

## 2019-05-03 DIAGNOSIS — M6281 Muscle weakness (generalized): Secondary | ICD-10-CM | POA: Diagnosis not present

## 2019-05-03 DIAGNOSIS — I679 Cerebrovascular disease, unspecified: Secondary | ICD-10-CM | POA: Diagnosis not present

## 2019-05-04 DIAGNOSIS — F0390 Unspecified dementia without behavioral disturbance: Secondary | ICD-10-CM | POA: Diagnosis not present

## 2019-05-04 DIAGNOSIS — M2450 Contracture, unspecified joint: Secondary | ICD-10-CM | POA: Diagnosis not present

## 2019-05-04 DIAGNOSIS — R2689 Other abnormalities of gait and mobility: Secondary | ICD-10-CM | POA: Diagnosis not present

## 2019-05-04 DIAGNOSIS — I679 Cerebrovascular disease, unspecified: Secondary | ICD-10-CM | POA: Diagnosis not present

## 2019-05-04 DIAGNOSIS — M6281 Muscle weakness (generalized): Secondary | ICD-10-CM | POA: Diagnosis not present

## 2019-05-05 DIAGNOSIS — I679 Cerebrovascular disease, unspecified: Secondary | ICD-10-CM | POA: Diagnosis not present

## 2019-05-05 DIAGNOSIS — F0390 Unspecified dementia without behavioral disturbance: Secondary | ICD-10-CM | POA: Diagnosis not present

## 2019-05-05 DIAGNOSIS — R2689 Other abnormalities of gait and mobility: Secondary | ICD-10-CM | POA: Diagnosis not present

## 2019-05-05 DIAGNOSIS — M2450 Contracture, unspecified joint: Secondary | ICD-10-CM | POA: Diagnosis not present

## 2019-05-05 DIAGNOSIS — M6281 Muscle weakness (generalized): Secondary | ICD-10-CM | POA: Diagnosis not present

## 2019-05-08 DIAGNOSIS — M2450 Contracture, unspecified joint: Secondary | ICD-10-CM | POA: Diagnosis not present

## 2019-05-08 DIAGNOSIS — R2689 Other abnormalities of gait and mobility: Secondary | ICD-10-CM | POA: Diagnosis not present

## 2019-05-08 DIAGNOSIS — I679 Cerebrovascular disease, unspecified: Secondary | ICD-10-CM | POA: Diagnosis not present

## 2019-05-08 DIAGNOSIS — M6281 Muscle weakness (generalized): Secondary | ICD-10-CM | POA: Diagnosis not present

## 2019-05-08 DIAGNOSIS — F0390 Unspecified dementia without behavioral disturbance: Secondary | ICD-10-CM | POA: Diagnosis not present

## 2019-05-11 DIAGNOSIS — M2450 Contracture, unspecified joint: Secondary | ICD-10-CM | POA: Diagnosis not present

## 2019-05-11 DIAGNOSIS — R2689 Other abnormalities of gait and mobility: Secondary | ICD-10-CM | POA: Diagnosis not present

## 2019-05-11 DIAGNOSIS — M6281 Muscle weakness (generalized): Secondary | ICD-10-CM | POA: Diagnosis not present

## 2019-05-11 DIAGNOSIS — I679 Cerebrovascular disease, unspecified: Secondary | ICD-10-CM | POA: Diagnosis not present

## 2019-05-11 DIAGNOSIS — F0390 Unspecified dementia without behavioral disturbance: Secondary | ICD-10-CM | POA: Diagnosis not present

## 2019-05-12 DIAGNOSIS — F0390 Unspecified dementia without behavioral disturbance: Secondary | ICD-10-CM | POA: Diagnosis not present

## 2019-05-12 DIAGNOSIS — M2450 Contracture, unspecified joint: Secondary | ICD-10-CM | POA: Diagnosis not present

## 2019-05-12 DIAGNOSIS — M6281 Muscle weakness (generalized): Secondary | ICD-10-CM | POA: Diagnosis not present

## 2019-05-12 DIAGNOSIS — R2689 Other abnormalities of gait and mobility: Secondary | ICD-10-CM | POA: Diagnosis not present

## 2019-05-12 DIAGNOSIS — I679 Cerebrovascular disease, unspecified: Secondary | ICD-10-CM | POA: Diagnosis not present

## 2019-05-15 DIAGNOSIS — M2450 Contracture, unspecified joint: Secondary | ICD-10-CM | POA: Diagnosis not present

## 2019-05-15 DIAGNOSIS — M6281 Muscle weakness (generalized): Secondary | ICD-10-CM | POA: Diagnosis not present

## 2019-05-15 DIAGNOSIS — R2689 Other abnormalities of gait and mobility: Secondary | ICD-10-CM | POA: Diagnosis not present

## 2019-05-15 DIAGNOSIS — I679 Cerebrovascular disease, unspecified: Secondary | ICD-10-CM | POA: Diagnosis not present

## 2019-05-15 DIAGNOSIS — F0390 Unspecified dementia without behavioral disturbance: Secondary | ICD-10-CM | POA: Diagnosis not present

## 2019-05-16 DIAGNOSIS — F0390 Unspecified dementia without behavioral disturbance: Secondary | ICD-10-CM | POA: Diagnosis not present

## 2019-05-16 DIAGNOSIS — R2689 Other abnormalities of gait and mobility: Secondary | ICD-10-CM | POA: Diagnosis not present

## 2019-05-16 DIAGNOSIS — I679 Cerebrovascular disease, unspecified: Secondary | ICD-10-CM | POA: Diagnosis not present

## 2019-05-16 DIAGNOSIS — M6281 Muscle weakness (generalized): Secondary | ICD-10-CM | POA: Diagnosis not present

## 2019-05-16 DIAGNOSIS — M2450 Contracture, unspecified joint: Secondary | ICD-10-CM | POA: Diagnosis not present

## 2019-05-17 DIAGNOSIS — M6281 Muscle weakness (generalized): Secondary | ICD-10-CM | POA: Diagnosis not present

## 2019-05-17 DIAGNOSIS — F0390 Unspecified dementia without behavioral disturbance: Secondary | ICD-10-CM | POA: Diagnosis not present

## 2019-05-17 DIAGNOSIS — M2450 Contracture, unspecified joint: Secondary | ICD-10-CM | POA: Diagnosis not present

## 2019-05-17 DIAGNOSIS — I679 Cerebrovascular disease, unspecified: Secondary | ICD-10-CM | POA: Diagnosis not present

## 2019-05-17 DIAGNOSIS — R2689 Other abnormalities of gait and mobility: Secondary | ICD-10-CM | POA: Diagnosis not present

## 2019-05-19 DIAGNOSIS — R2689 Other abnormalities of gait and mobility: Secondary | ICD-10-CM | POA: Diagnosis not present

## 2019-05-19 DIAGNOSIS — F0390 Unspecified dementia without behavioral disturbance: Secondary | ICD-10-CM | POA: Diagnosis not present

## 2019-05-19 DIAGNOSIS — M6281 Muscle weakness (generalized): Secondary | ICD-10-CM | POA: Diagnosis not present

## 2019-05-19 DIAGNOSIS — I679 Cerebrovascular disease, unspecified: Secondary | ICD-10-CM | POA: Diagnosis not present

## 2019-05-19 DIAGNOSIS — M2450 Contracture, unspecified joint: Secondary | ICD-10-CM | POA: Diagnosis not present

## 2019-05-21 DIAGNOSIS — M2450 Contracture, unspecified joint: Secondary | ICD-10-CM | POA: Diagnosis not present

## 2019-05-21 DIAGNOSIS — F0390 Unspecified dementia without behavioral disturbance: Secondary | ICD-10-CM | POA: Diagnosis not present

## 2019-05-21 DIAGNOSIS — M6281 Muscle weakness (generalized): Secondary | ICD-10-CM | POA: Diagnosis not present

## 2019-05-21 DIAGNOSIS — I679 Cerebrovascular disease, unspecified: Secondary | ICD-10-CM | POA: Diagnosis not present

## 2019-05-21 DIAGNOSIS — R2689 Other abnormalities of gait and mobility: Secondary | ICD-10-CM | POA: Diagnosis not present

## 2019-05-22 DIAGNOSIS — M2450 Contracture, unspecified joint: Secondary | ICD-10-CM | POA: Diagnosis not present

## 2019-05-22 DIAGNOSIS — R2689 Other abnormalities of gait and mobility: Secondary | ICD-10-CM | POA: Diagnosis not present

## 2019-05-22 DIAGNOSIS — I679 Cerebrovascular disease, unspecified: Secondary | ICD-10-CM | POA: Diagnosis not present

## 2019-05-22 DIAGNOSIS — M6281 Muscle weakness (generalized): Secondary | ICD-10-CM | POA: Diagnosis not present

## 2019-05-22 DIAGNOSIS — F0390 Unspecified dementia without behavioral disturbance: Secondary | ICD-10-CM | POA: Diagnosis not present

## 2019-05-23 DIAGNOSIS — F0390 Unspecified dementia without behavioral disturbance: Secondary | ICD-10-CM | POA: Diagnosis not present

## 2019-05-23 DIAGNOSIS — I679 Cerebrovascular disease, unspecified: Secondary | ICD-10-CM | POA: Diagnosis not present

## 2019-05-23 DIAGNOSIS — R2689 Other abnormalities of gait and mobility: Secondary | ICD-10-CM | POA: Diagnosis not present

## 2019-05-23 DIAGNOSIS — M6281 Muscle weakness (generalized): Secondary | ICD-10-CM | POA: Diagnosis not present

## 2019-05-23 DIAGNOSIS — M2450 Contracture, unspecified joint: Secondary | ICD-10-CM | POA: Diagnosis not present

## 2019-05-25 DIAGNOSIS — M6281 Muscle weakness (generalized): Secondary | ICD-10-CM | POA: Diagnosis not present

## 2019-05-25 DIAGNOSIS — R2689 Other abnormalities of gait and mobility: Secondary | ICD-10-CM | POA: Diagnosis not present

## 2019-05-25 DIAGNOSIS — F0390 Unspecified dementia without behavioral disturbance: Secondary | ICD-10-CM | POA: Diagnosis not present

## 2019-05-25 DIAGNOSIS — I679 Cerebrovascular disease, unspecified: Secondary | ICD-10-CM | POA: Diagnosis not present

## 2019-05-25 DIAGNOSIS — M2450 Contracture, unspecified joint: Secondary | ICD-10-CM | POA: Diagnosis not present

## 2019-06-01 DIAGNOSIS — E785 Hyperlipidemia, unspecified: Secondary | ICD-10-CM | POA: Diagnosis not present

## 2019-06-01 DIAGNOSIS — E559 Vitamin D deficiency, unspecified: Secondary | ICD-10-CM | POA: Diagnosis not present

## 2019-06-01 DIAGNOSIS — E0822 Diabetes mellitus due to underlying condition with diabetic chronic kidney disease: Secondary | ICD-10-CM | POA: Diagnosis not present

## 2019-06-01 DIAGNOSIS — D649 Anemia, unspecified: Secondary | ICD-10-CM | POA: Diagnosis not present

## 2019-06-01 DIAGNOSIS — E039 Hypothyroidism, unspecified: Secondary | ICD-10-CM | POA: Diagnosis not present

## 2019-06-01 DIAGNOSIS — E119 Type 2 diabetes mellitus without complications: Secondary | ICD-10-CM | POA: Diagnosis not present

## 2019-06-02 DIAGNOSIS — F0390 Unspecified dementia without behavioral disturbance: Secondary | ICD-10-CM | POA: Diagnosis not present

## 2019-06-02 DIAGNOSIS — H409 Unspecified glaucoma: Secondary | ICD-10-CM | POA: Diagnosis not present

## 2019-06-02 DIAGNOSIS — F0280 Dementia in other diseases classified elsewhere without behavioral disturbance: Secondary | ICD-10-CM | POA: Diagnosis not present

## 2019-06-02 DIAGNOSIS — G301 Alzheimer's disease with late onset: Secondary | ICD-10-CM | POA: Diagnosis not present

## 2019-06-12 ENCOUNTER — Other Ambulatory Visit: Payer: Self-pay

## 2019-06-28 DIAGNOSIS — M6281 Muscle weakness (generalized): Secondary | ICD-10-CM | POA: Diagnosis not present

## 2019-06-28 DIAGNOSIS — R278 Other lack of coordination: Secondary | ICD-10-CM | POA: Diagnosis not present

## 2019-06-28 DIAGNOSIS — I679 Cerebrovascular disease, unspecified: Secondary | ICD-10-CM | POA: Diagnosis not present

## 2019-06-28 DIAGNOSIS — G301 Alzheimer's disease with late onset: Secondary | ICD-10-CM | POA: Diagnosis not present

## 2019-06-29 DIAGNOSIS — I679 Cerebrovascular disease, unspecified: Secondary | ICD-10-CM | POA: Diagnosis not present

## 2019-06-29 DIAGNOSIS — Z20828 Contact with and (suspected) exposure to other viral communicable diseases: Secondary | ICD-10-CM | POA: Diagnosis not present

## 2019-06-29 DIAGNOSIS — R278 Other lack of coordination: Secondary | ICD-10-CM | POA: Diagnosis not present

## 2019-06-29 DIAGNOSIS — M6281 Muscle weakness (generalized): Secondary | ICD-10-CM | POA: Diagnosis not present

## 2019-06-29 DIAGNOSIS — G301 Alzheimer's disease with late onset: Secondary | ICD-10-CM | POA: Diagnosis not present

## 2019-06-30 DIAGNOSIS — I679 Cerebrovascular disease, unspecified: Secondary | ICD-10-CM | POA: Diagnosis not present

## 2019-06-30 DIAGNOSIS — M6281 Muscle weakness (generalized): Secondary | ICD-10-CM | POA: Diagnosis not present

## 2019-06-30 DIAGNOSIS — G301 Alzheimer's disease with late onset: Secondary | ICD-10-CM | POA: Diagnosis not present

## 2019-06-30 DIAGNOSIS — R278 Other lack of coordination: Secondary | ICD-10-CM | POA: Diagnosis not present

## 2019-07-03 DIAGNOSIS — I679 Cerebrovascular disease, unspecified: Secondary | ICD-10-CM | POA: Diagnosis not present

## 2019-07-03 DIAGNOSIS — Q845 Enlarged and hypertrophic nails: Secondary | ICD-10-CM | POA: Diagnosis not present

## 2019-07-03 DIAGNOSIS — L603 Nail dystrophy: Secondary | ICD-10-CM | POA: Diagnosis not present

## 2019-07-03 DIAGNOSIS — M6281 Muscle weakness (generalized): Secondary | ICD-10-CM | POA: Diagnosis not present

## 2019-07-03 DIAGNOSIS — B351 Tinea unguium: Secondary | ICD-10-CM | POA: Diagnosis not present

## 2019-07-03 DIAGNOSIS — R278 Other lack of coordination: Secondary | ICD-10-CM | POA: Diagnosis not present

## 2019-07-03 DIAGNOSIS — L84 Corns and callosities: Secondary | ICD-10-CM | POA: Diagnosis not present

## 2019-07-03 DIAGNOSIS — G301 Alzheimer's disease with late onset: Secondary | ICD-10-CM | POA: Diagnosis not present

## 2019-07-03 DIAGNOSIS — I739 Peripheral vascular disease, unspecified: Secondary | ICD-10-CM | POA: Diagnosis not present

## 2019-07-04 DIAGNOSIS — G301 Alzheimer's disease with late onset: Secondary | ICD-10-CM | POA: Diagnosis not present

## 2019-07-04 DIAGNOSIS — M6281 Muscle weakness (generalized): Secondary | ICD-10-CM | POA: Diagnosis not present

## 2019-07-04 DIAGNOSIS — R278 Other lack of coordination: Secondary | ICD-10-CM | POA: Diagnosis not present

## 2019-07-04 DIAGNOSIS — I679 Cerebrovascular disease, unspecified: Secondary | ICD-10-CM | POA: Diagnosis not present

## 2019-07-05 DIAGNOSIS — I679 Cerebrovascular disease, unspecified: Secondary | ICD-10-CM | POA: Diagnosis not present

## 2019-07-05 DIAGNOSIS — M6281 Muscle weakness (generalized): Secondary | ICD-10-CM | POA: Diagnosis not present

## 2019-07-05 DIAGNOSIS — G301 Alzheimer's disease with late onset: Secondary | ICD-10-CM | POA: Diagnosis not present

## 2019-07-05 DIAGNOSIS — R278 Other lack of coordination: Secondary | ICD-10-CM | POA: Diagnosis not present

## 2019-07-10 DIAGNOSIS — I679 Cerebrovascular disease, unspecified: Secondary | ICD-10-CM | POA: Diagnosis not present

## 2019-07-10 DIAGNOSIS — R278 Other lack of coordination: Secondary | ICD-10-CM | POA: Diagnosis not present

## 2019-07-10 DIAGNOSIS — G301 Alzheimer's disease with late onset: Secondary | ICD-10-CM | POA: Diagnosis not present

## 2019-07-10 DIAGNOSIS — M6281 Muscle weakness (generalized): Secondary | ICD-10-CM | POA: Diagnosis not present

## 2019-07-11 DIAGNOSIS — R278 Other lack of coordination: Secondary | ICD-10-CM | POA: Diagnosis not present

## 2019-07-11 DIAGNOSIS — I679 Cerebrovascular disease, unspecified: Secondary | ICD-10-CM | POA: Diagnosis not present

## 2019-07-11 DIAGNOSIS — G301 Alzheimer's disease with late onset: Secondary | ICD-10-CM | POA: Diagnosis not present

## 2019-07-11 DIAGNOSIS — M6281 Muscle weakness (generalized): Secondary | ICD-10-CM | POA: Diagnosis not present

## 2019-07-12 DIAGNOSIS — I679 Cerebrovascular disease, unspecified: Secondary | ICD-10-CM | POA: Diagnosis not present

## 2019-07-12 DIAGNOSIS — G301 Alzheimer's disease with late onset: Secondary | ICD-10-CM | POA: Diagnosis not present

## 2019-07-12 DIAGNOSIS — R278 Other lack of coordination: Secondary | ICD-10-CM | POA: Diagnosis not present

## 2019-07-12 DIAGNOSIS — M6281 Muscle weakness (generalized): Secondary | ICD-10-CM | POA: Diagnosis not present

## 2019-07-31 DIAGNOSIS — G301 Alzheimer's disease with late onset: Secondary | ICD-10-CM | POA: Diagnosis not present

## 2019-07-31 DIAGNOSIS — F0390 Unspecified dementia without behavioral disturbance: Secondary | ICD-10-CM | POA: Diagnosis not present

## 2019-07-31 DIAGNOSIS — H409 Unspecified glaucoma: Secondary | ICD-10-CM | POA: Diagnosis not present

## 2019-07-31 DIAGNOSIS — I679 Cerebrovascular disease, unspecified: Secondary | ICD-10-CM | POA: Diagnosis not present

## 2019-08-27 DIAGNOSIS — Z20828 Contact with and (suspected) exposure to other viral communicable diseases: Secondary | ICD-10-CM | POA: Diagnosis not present

## 2019-08-31 DIAGNOSIS — Z20828 Contact with and (suspected) exposure to other viral communicable diseases: Secondary | ICD-10-CM | POA: Diagnosis not present

## 2019-09-07 DIAGNOSIS — Z20828 Contact with and (suspected) exposure to other viral communicable diseases: Secondary | ICD-10-CM | POA: Diagnosis not present

## 2019-09-07 IMAGING — DX DG CHEST 1V PORT
1 series · 1 of 1 positions shown · non-contrast
Comparison: Portable chest x-ray July 10, 2018

CLINICAL DATA: Hypothermia

EXAM:
PORTABLE CHEST 1 VIEW

[chest ap]
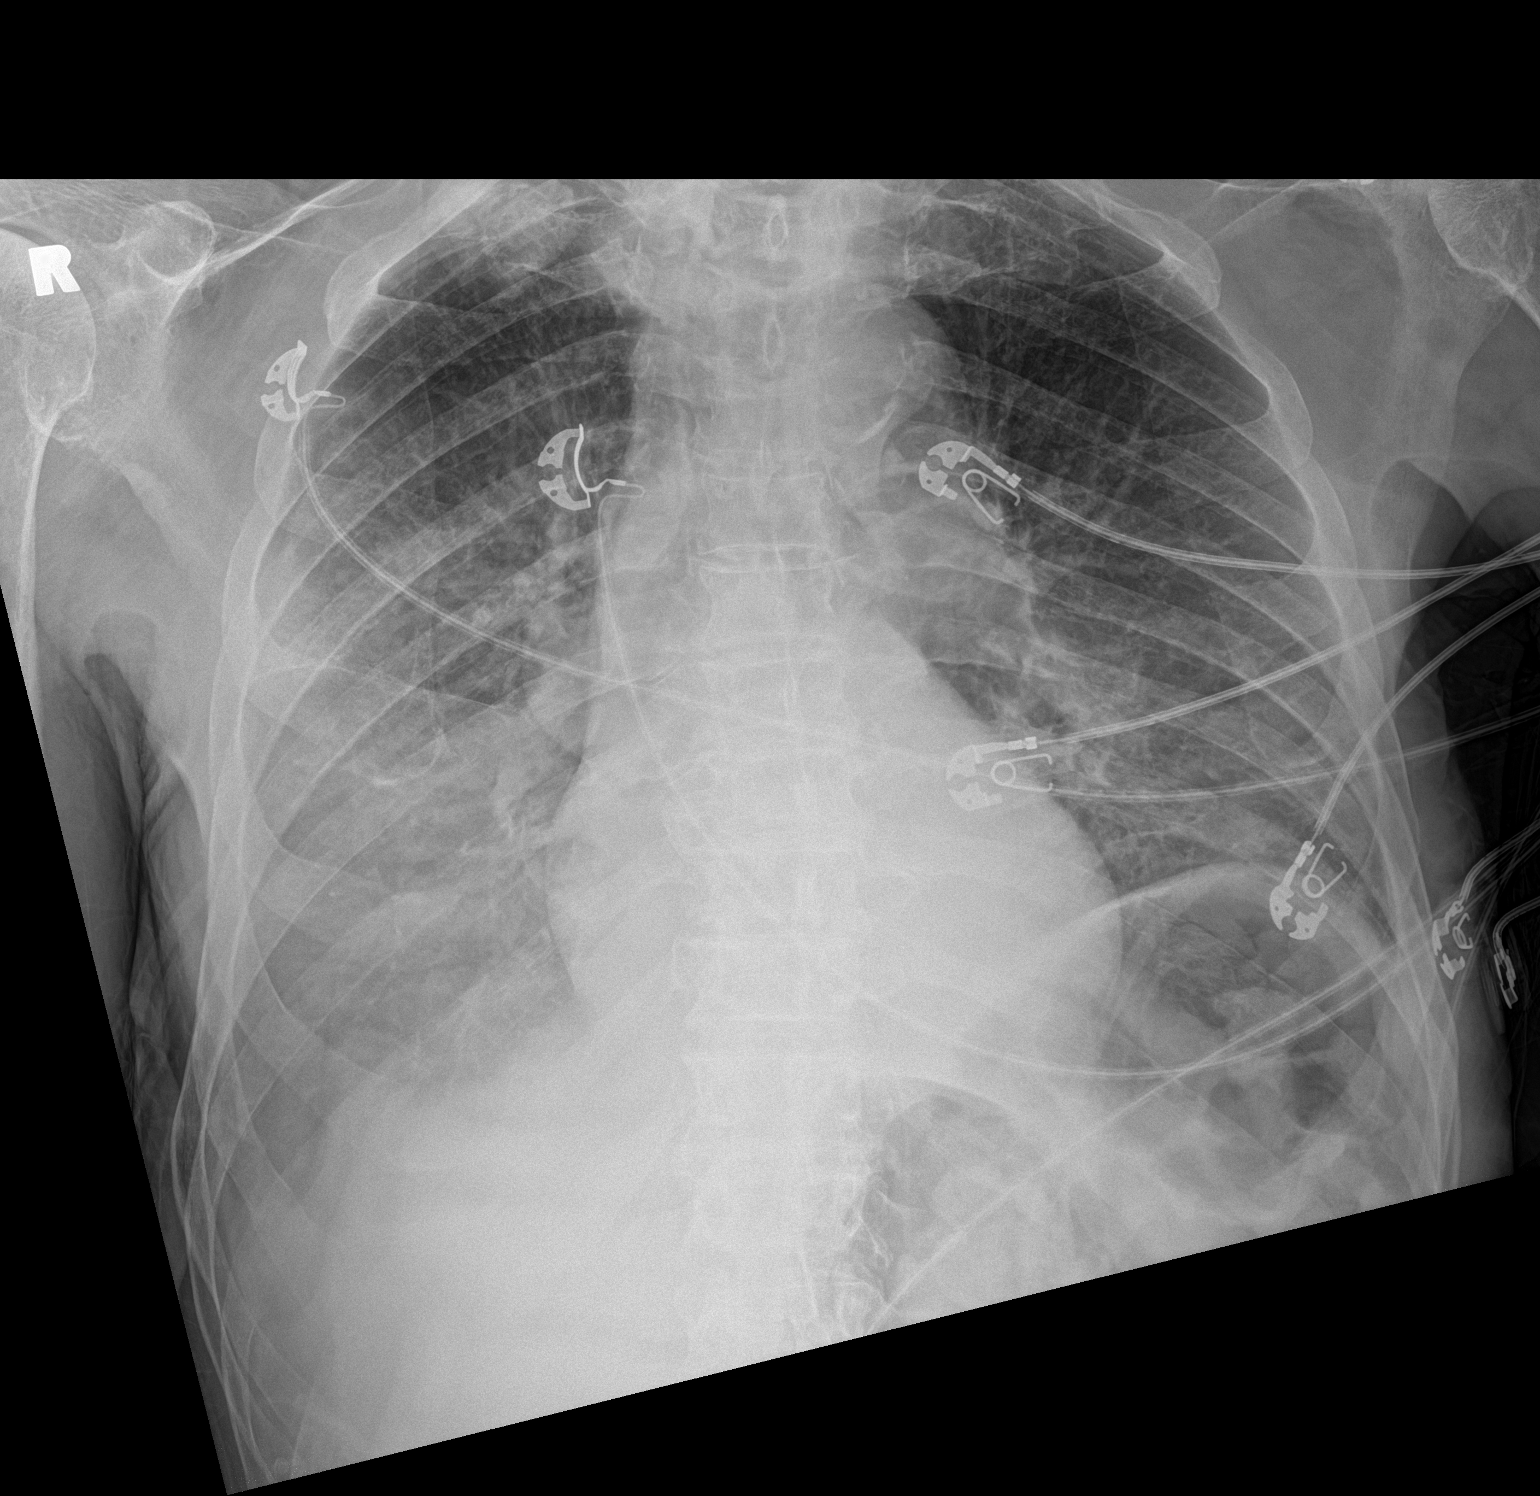

[1 of 1 positions shown; findings below may reference images not displayed]

FINDINGS: The lungs are adequately inflated. There is increased density in the
right mid and lower lung. There is persistent increased density at
the left lung base. The heart and pulmonary vascularity are normal.
There is calcification in the wall of the aortic arch.
IMPRESSION: Findings worrisome for posterior layering pleural fluid on the right
which appears new since the previous study. Alternatively,
atelectasis or pneumonia in the right lower lobe could be present.
No CHF. Stable coarse lung markings at the left lung base likely
reflects atelectasis.

Thoracic aortic atherosclerosis.

## 2019-09-07 IMAGING — CT CT CHEST W/O CM
2 of 4 series · 15 of 36 positions shown, 18 images · non-contrast
Comparison: None.

CLINICAL DATA: 87-year-old male with history of hypothermia and
bradycardia.

EXAM:
CT CHEST WITHOUT CONTRAST
TECHNIQUE: Multidetector CT imaging of the chest was performed following the
standard protocol without IV contrast.

[Series 3: chest wo · axial · 0.67mm/px · z∈[+1077,+1357]mm · 12 of 166 slices shown, 15 images]
[im 13/166  mediastinal]
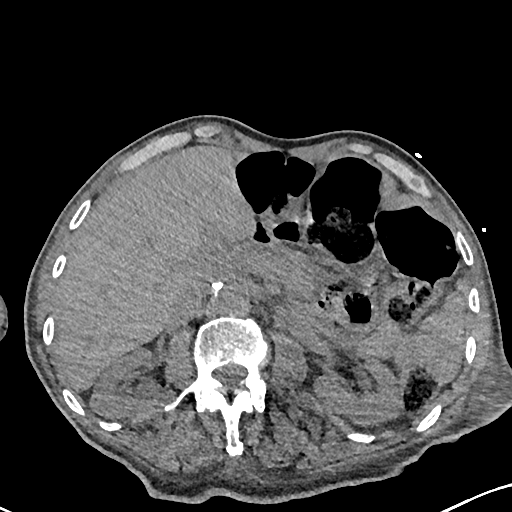
[im 13/166  lung]
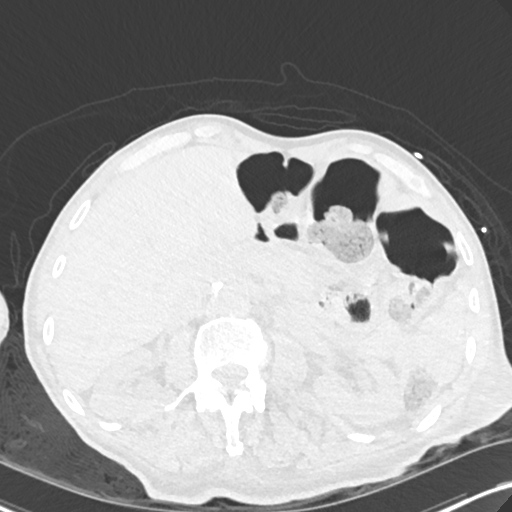
[im 26/166  lung]
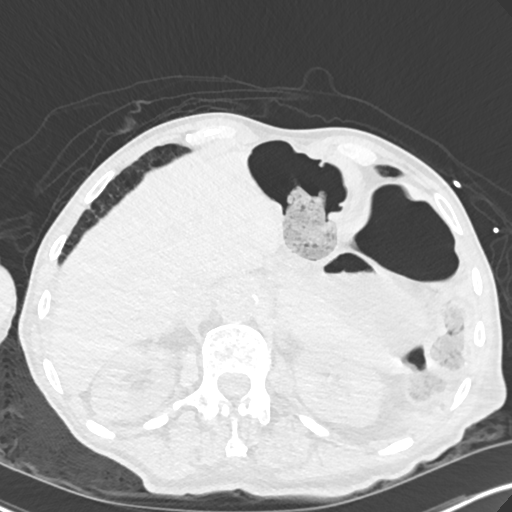
[im 39/166  lung]
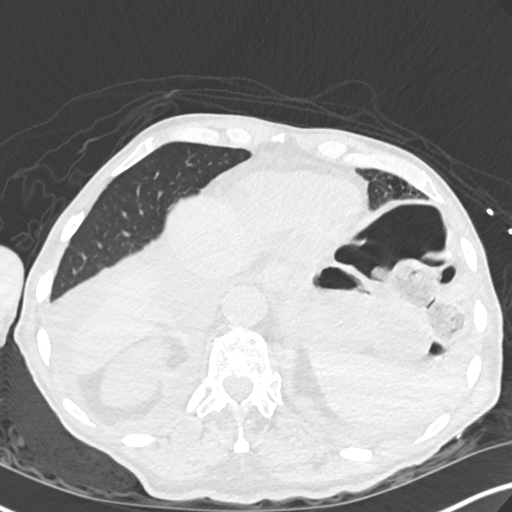
[im 51/166  lung]
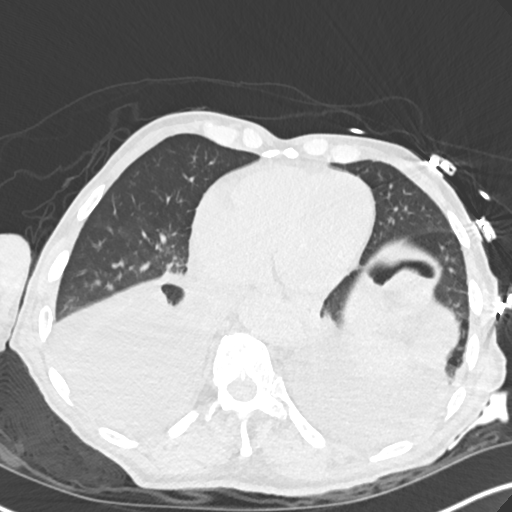
[im 64/166  mediastinal]
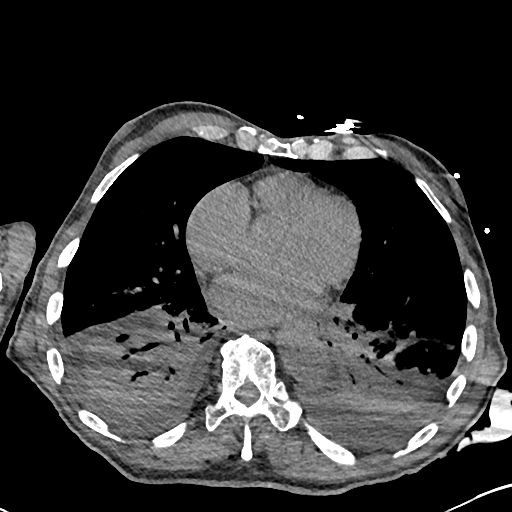
[im 64/166  lung]
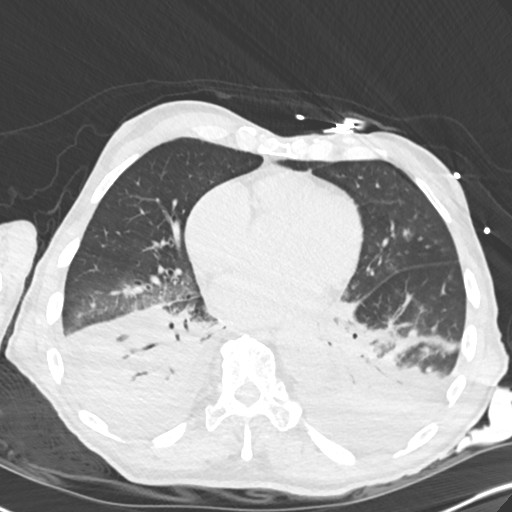
[im 77/166  lung]
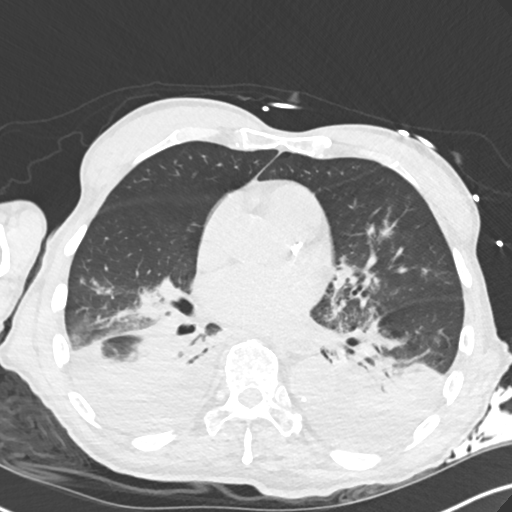
[im 89/166  lung]
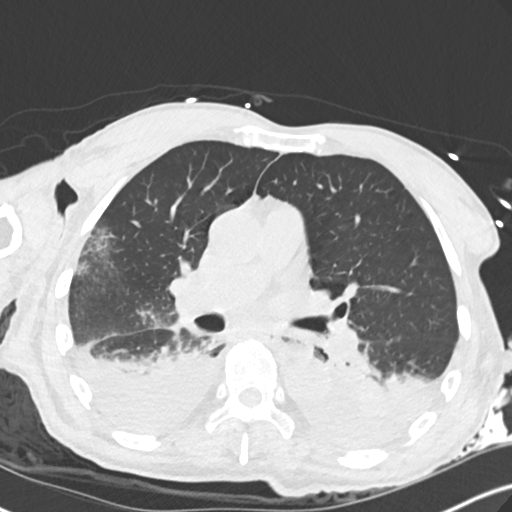
[im 102/166  lung]
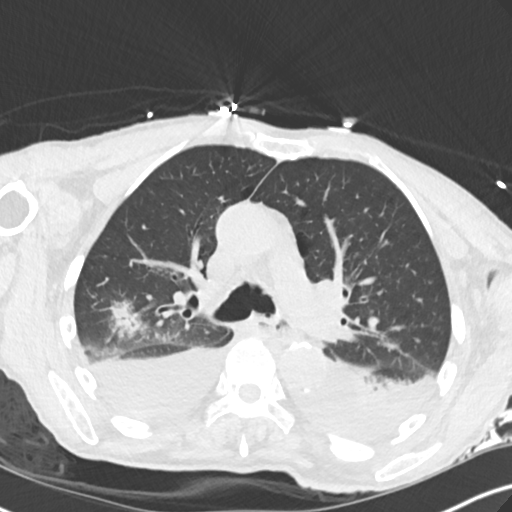
[im 115/166  mediastinal]
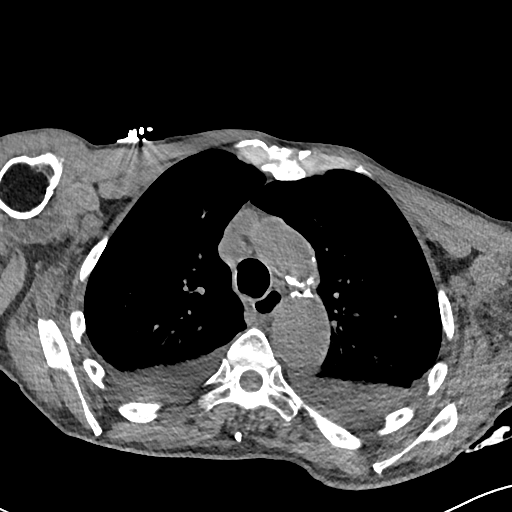
[im 115/166  lung]
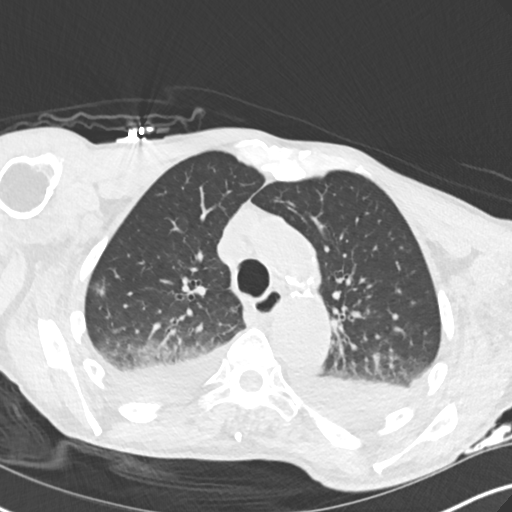
[im 127/166  lung]
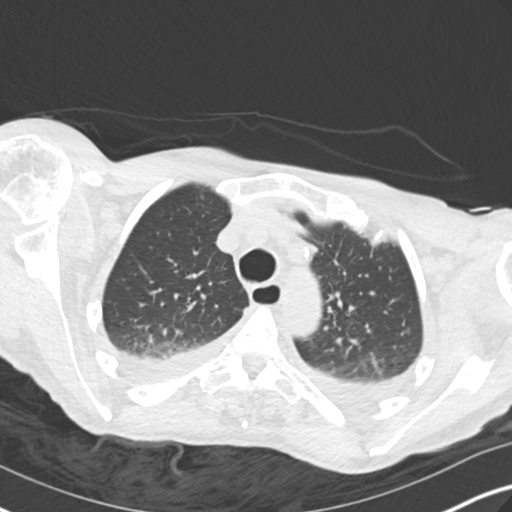
[im 140/166  lung]
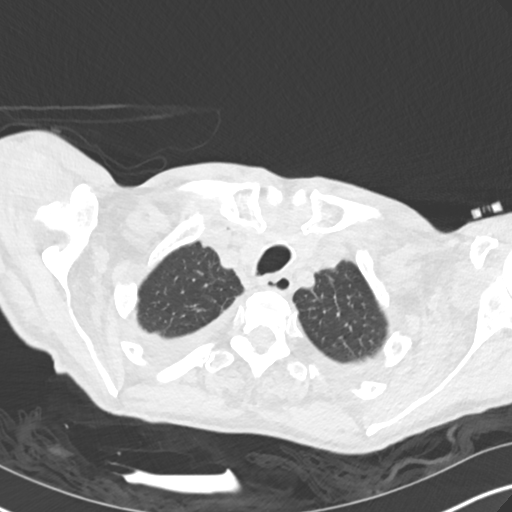
[im 153/166  lung]
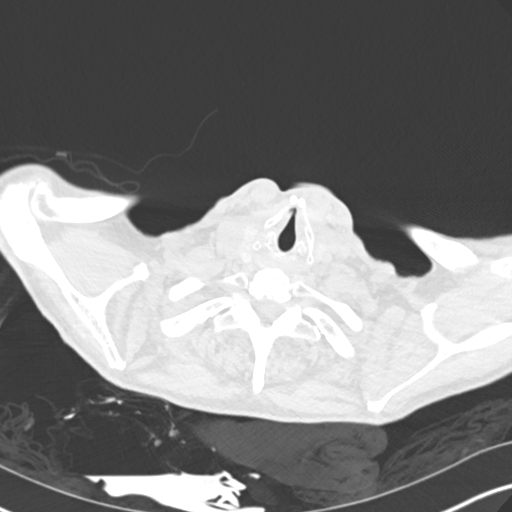

[Series 6: cor · coronal · 0.65mm/px · 3 of 151 slices shown]
[im 31/151  lung]
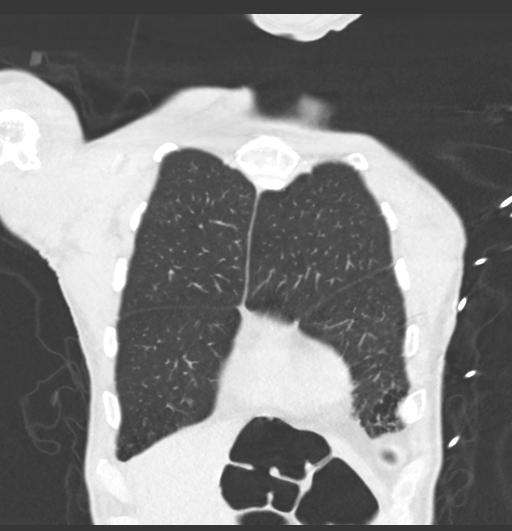
[im 61/151  lung]
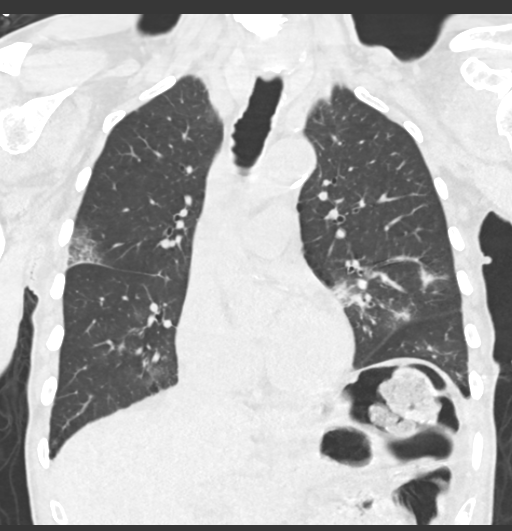
[im 91/151  lung]
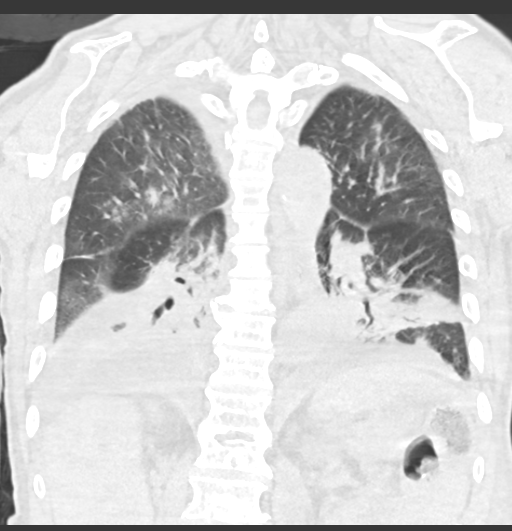

[15 of 36 positions shown; findings below may reference images not displayed]

FINDINGS: Cardiovascular: Heart size is normal. There is no significant
pericardial fluid, thickening or pericardial calcification. There is
aortic atherosclerosis, as well as atherosclerosis of the great
vessels of the mediastinum and the coronary arteries, including
calcified atherosclerotic plaque in the left main, left anterior
descending, left circumflex and right coronary arteries.
Calcifications of the aortic valve.

Mediastinum/Nodes: No pathologically enlarged mediastinal or hilar
lymph nodes. Please note that accurate exclusion of hilar adenopathy
is limited on noncontrast CT scans. Esophagus is unremarkable in
appearance. No axillary lymphadenopathy.

Lungs/Pleura: Small to moderate bilateral pleural effusions.
Dependent opacities in the lower lobes of the lungs bilaterally,
predominantly reflect atelectasis. Some patchy multifocal airspace
disease is also noted, peribronchovascular in distribution and
somewhat nodular in appearance scattered throughout the lungs
bilaterally, compatible with multifocal bronchopneumonia.

Upper Abdomen: Aortic atherosclerosis.

Musculoskeletal: Numerous Schmorl's nodes throughout the thoracic
spine. There are no aggressive appearing lytic or blastic lesions
noted in the visualized portions of the skeleton.
IMPRESSION: 1. Multilobar bronchopneumonia with small to moderate bilateral
pleural effusions lying dependently and extensive areas of passive
atelectasis in the lower lobes of the lungs bilaterally.
2. Aortic atherosclerosis, in addition to left main and 3 vessel
coronary artery disease.
3. There are calcifications of the aortic valve. Echocardiographic
correlation for evaluation of potential valvular dysfunction may be
warranted if clinically indicated.

Aortic Atherosclerosis (CAA2C-2EY.Y).

## 2019-09-13 IMAGING — RF DG SWALLOWING FUNCTION - NRPT [facility]
5 series · 13 of 20 positions shown · non-contrast
Comparison: none

[Series 1: run · 3 of 19 frames shown (1 of 5)]
[frame 2/19]
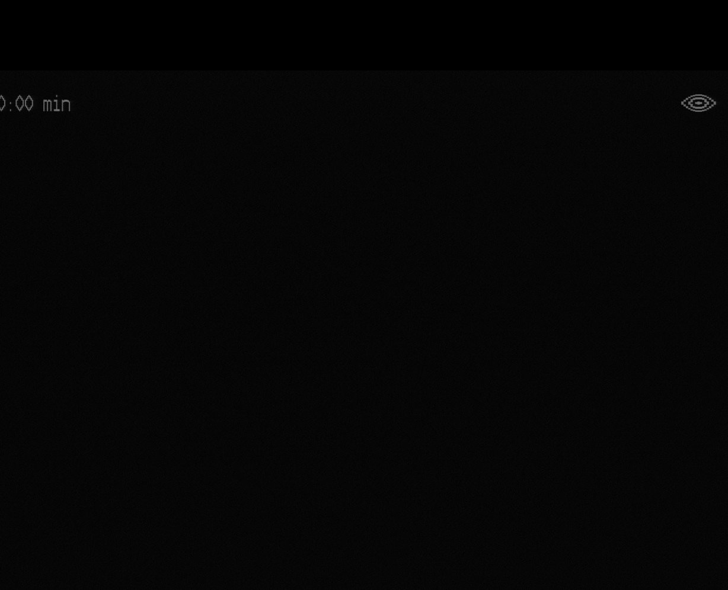
[frame 10/19]
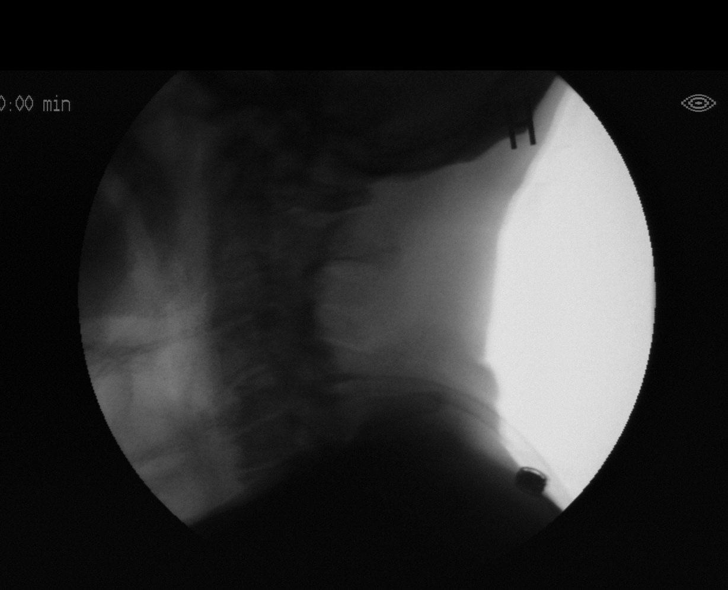
[frame 17/19]
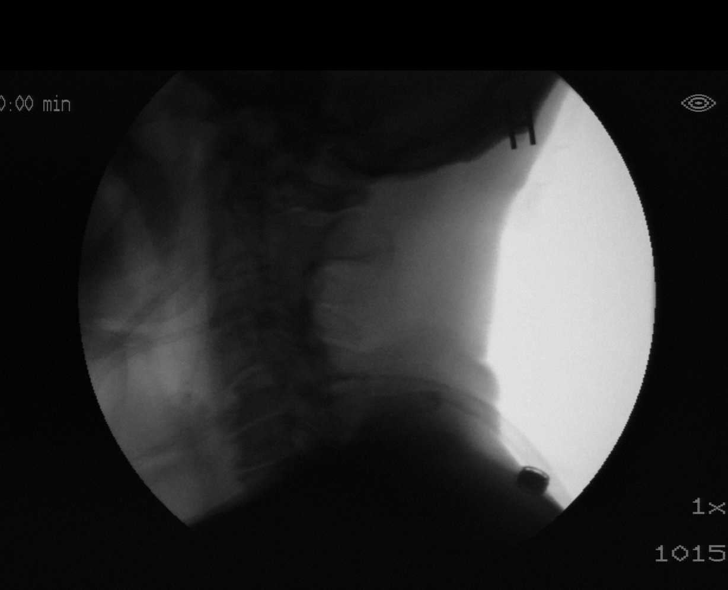

[Series 2: run · 2 of 18 frames shown (2 of 5)]
[frame 3/18]
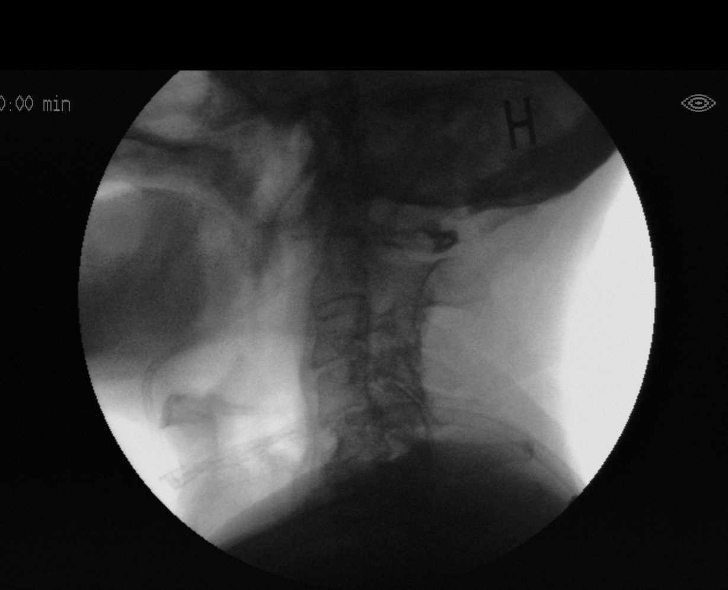
[frame 10/18]
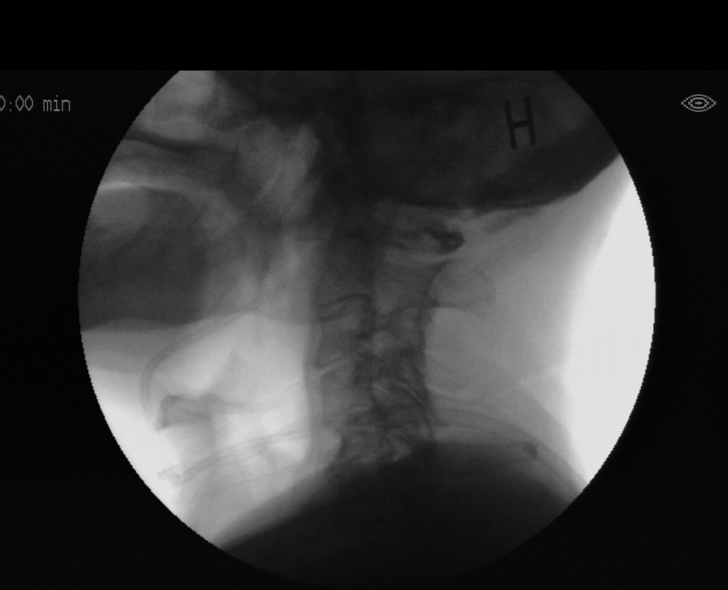

[Series 3: run · 3 of 1080 frames shown (3 of 5)]
[frame 163/1080]
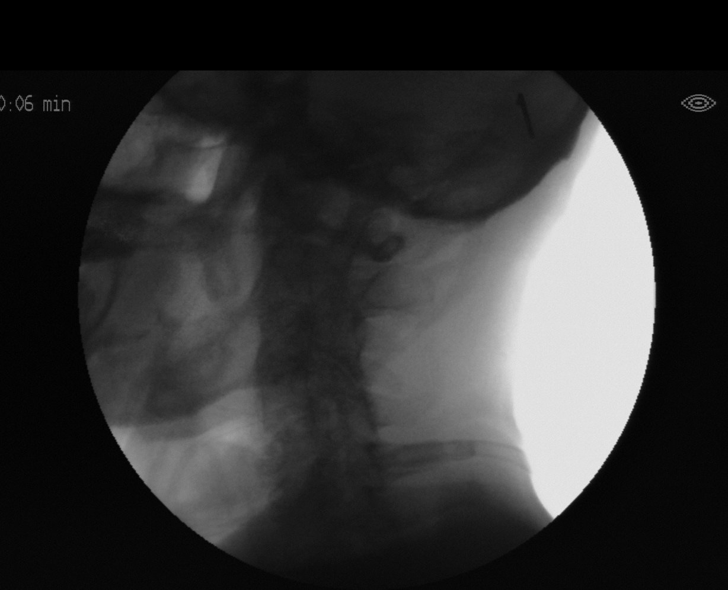
[frame 563/1080]
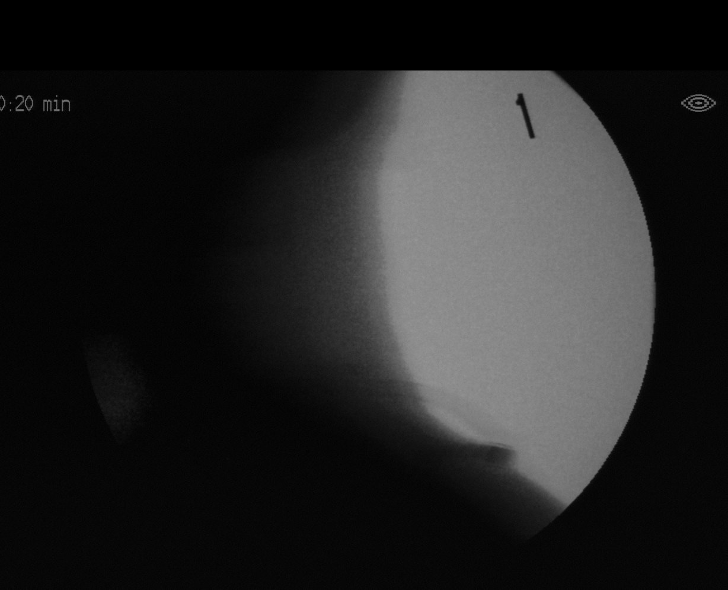
[frame 919/1080]
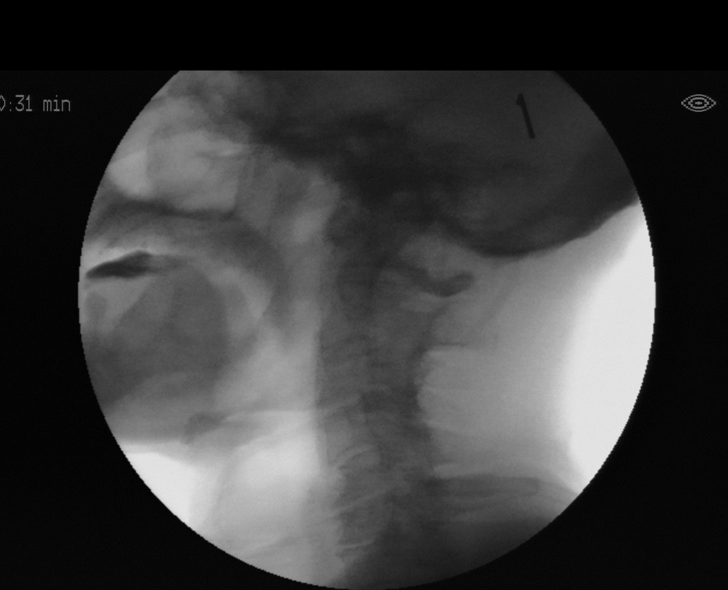

[Series 4: run · 2 of 167 frames shown (4 of 5)]
[frame 84/167]
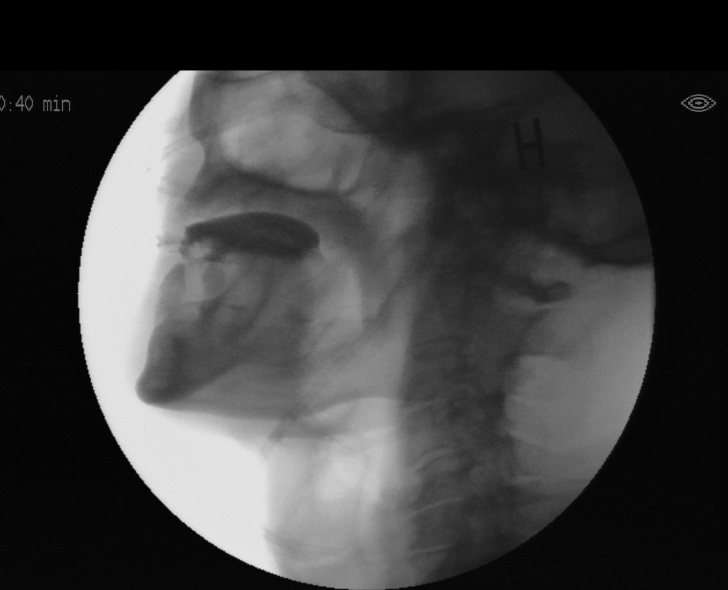
[frame 142/167]
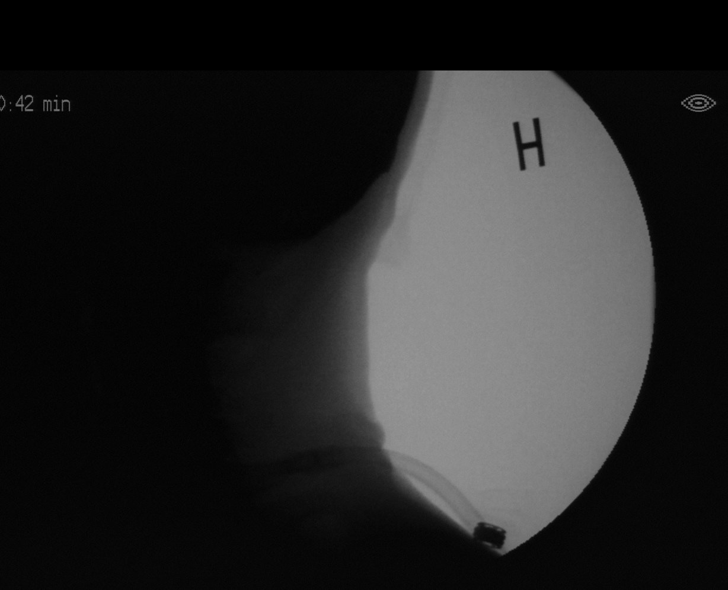

[Series 5: run · 3 of 688 frames shown (5 of 5)]
[frame 5/688]
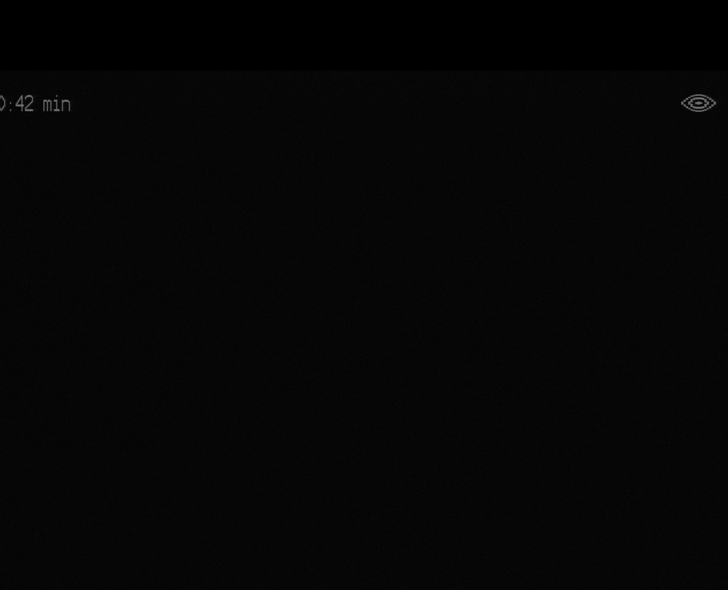
[frame 104/688]
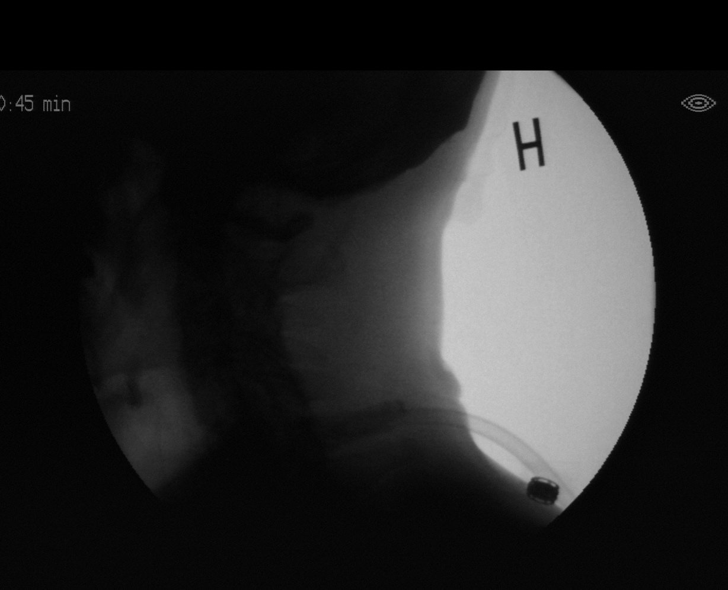
[frame 585/688]
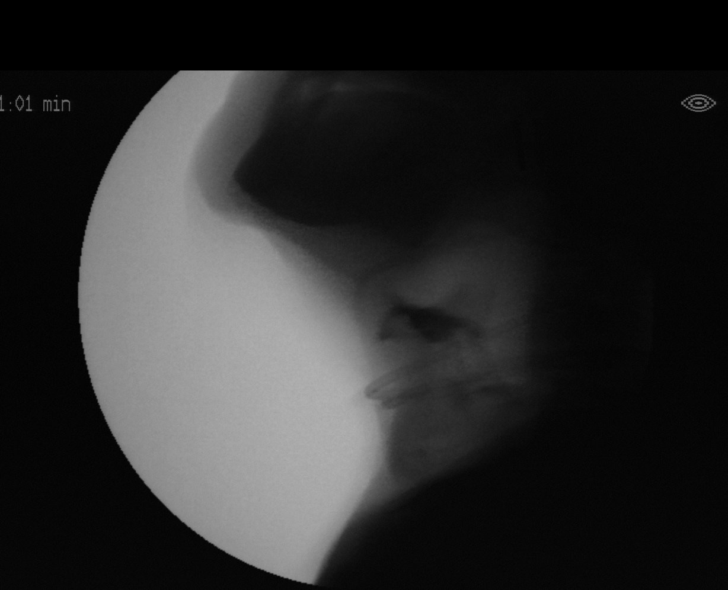

[13 of 20 positions shown; findings below may reference images not displayed]

FLUOROSCOPY FOR SWALLOWING FUNCTION STUDY:
Fluoroscopy was provided for swallowing function study, which was administered by a speech pathologist.  Final results and recommendations from this study are contained within the speech pathology report.

## 2019-09-14 DIAGNOSIS — Z20828 Contact with and (suspected) exposure to other viral communicable diseases: Secondary | ICD-10-CM | POA: Diagnosis not present

## 2019-09-21 DIAGNOSIS — Z20828 Contact with and (suspected) exposure to other viral communicable diseases: Secondary | ICD-10-CM | POA: Diagnosis not present

## 2019-09-28 DIAGNOSIS — Z20828 Contact with and (suspected) exposure to other viral communicable diseases: Secondary | ICD-10-CM | POA: Diagnosis not present

## 2019-10-03 DIAGNOSIS — G301 Alzheimer's disease with late onset: Secondary | ICD-10-CM | POA: Diagnosis not present

## 2019-10-03 DIAGNOSIS — M6281 Muscle weakness (generalized): Secondary | ICD-10-CM | POA: Diagnosis not present

## 2019-10-03 DIAGNOSIS — Z20828 Contact with and (suspected) exposure to other viral communicable diseases: Secondary | ICD-10-CM | POA: Diagnosis not present

## 2019-10-04 DIAGNOSIS — M6281 Muscle weakness (generalized): Secondary | ICD-10-CM | POA: Diagnosis not present

## 2019-10-04 DIAGNOSIS — G301 Alzheimer's disease with late onset: Secondary | ICD-10-CM | POA: Diagnosis not present

## 2019-10-09 DIAGNOSIS — M6281 Muscle weakness (generalized): Secondary | ICD-10-CM | POA: Diagnosis not present

## 2019-10-09 DIAGNOSIS — F0280 Dementia in other diseases classified elsewhere without behavioral disturbance: Secondary | ICD-10-CM | POA: Diagnosis not present

## 2019-10-09 DIAGNOSIS — F0390 Unspecified dementia without behavioral disturbance: Secondary | ICD-10-CM | POA: Diagnosis not present

## 2019-10-09 DIAGNOSIS — H409 Unspecified glaucoma: Secondary | ICD-10-CM | POA: Diagnosis not present

## 2019-10-09 DIAGNOSIS — G301 Alzheimer's disease with late onset: Secondary | ICD-10-CM | POA: Diagnosis not present

## 2019-10-09 DIAGNOSIS — I679 Cerebrovascular disease, unspecified: Secondary | ICD-10-CM | POA: Diagnosis not present

## 2019-10-10 DIAGNOSIS — Z20828 Contact with and (suspected) exposure to other viral communicable diseases: Secondary | ICD-10-CM | POA: Diagnosis not present

## 2019-10-11 DIAGNOSIS — M6281 Muscle weakness (generalized): Secondary | ICD-10-CM | POA: Diagnosis not present

## 2019-10-11 DIAGNOSIS — G301 Alzheimer's disease with late onset: Secondary | ICD-10-CM | POA: Diagnosis not present

## 2019-10-12 DIAGNOSIS — L84 Corns and callosities: Secondary | ICD-10-CM | POA: Diagnosis not present

## 2019-10-12 DIAGNOSIS — Q845 Enlarged and hypertrophic nails: Secondary | ICD-10-CM | POA: Diagnosis not present

## 2019-10-12 DIAGNOSIS — B351 Tinea unguium: Secondary | ICD-10-CM | POA: Diagnosis not present

## 2019-10-12 DIAGNOSIS — I739 Peripheral vascular disease, unspecified: Secondary | ICD-10-CM | POA: Diagnosis not present

## 2019-10-13 DIAGNOSIS — M6281 Muscle weakness (generalized): Secondary | ICD-10-CM | POA: Diagnosis not present

## 2019-10-13 DIAGNOSIS — G301 Alzheimer's disease with late onset: Secondary | ICD-10-CM | POA: Diagnosis not present

## 2019-10-16 DIAGNOSIS — G301 Alzheimer's disease with late onset: Secondary | ICD-10-CM | POA: Diagnosis not present

## 2019-10-16 DIAGNOSIS — M6281 Muscle weakness (generalized): Secondary | ICD-10-CM | POA: Diagnosis not present

## 2019-10-17 DIAGNOSIS — Z20828 Contact with and (suspected) exposure to other viral communicable diseases: Secondary | ICD-10-CM | POA: Diagnosis not present

## 2019-10-17 DIAGNOSIS — G301 Alzheimer's disease with late onset: Secondary | ICD-10-CM | POA: Diagnosis not present

## 2019-10-17 DIAGNOSIS — M6281 Muscle weakness (generalized): Secondary | ICD-10-CM | POA: Diagnosis not present

## 2019-10-18 DIAGNOSIS — M6281 Muscle weakness (generalized): Secondary | ICD-10-CM | POA: Diagnosis not present

## 2019-10-18 DIAGNOSIS — G301 Alzheimer's disease with late onset: Secondary | ICD-10-CM | POA: Diagnosis not present

## 2019-10-19 DIAGNOSIS — G301 Alzheimer's disease with late onset: Secondary | ICD-10-CM | POA: Diagnosis present

## 2019-10-19 DIAGNOSIS — R4182 Altered mental status, unspecified: Secondary | ICD-10-CM | POA: Diagnosis not present

## 2019-10-19 DIAGNOSIS — R279 Unspecified lack of coordination: Secondary | ICD-10-CM | POA: Diagnosis present

## 2019-10-19 DIAGNOSIS — R1312 Dysphagia, oropharyngeal phase: Secondary | ICD-10-CM | POA: Diagnosis present

## 2019-10-19 DIAGNOSIS — M6281 Muscle weakness (generalized): Secondary | ICD-10-CM | POA: Diagnosis present

## 2019-10-19 DIAGNOSIS — U071 COVID-19: Secondary | ICD-10-CM | POA: Diagnosis present

## 2019-10-30 DIAGNOSIS — M6281 Muscle weakness (generalized): Secondary | ICD-10-CM | POA: Diagnosis not present

## 2019-10-30 DIAGNOSIS — G301 Alzheimer's disease with late onset: Secondary | ICD-10-CM | POA: Diagnosis not present

## 2019-10-31 DIAGNOSIS — M6281 Muscle weakness (generalized): Secondary | ICD-10-CM | POA: Diagnosis not present

## 2019-10-31 DIAGNOSIS — G301 Alzheimer's disease with late onset: Secondary | ICD-10-CM | POA: Diagnosis not present

## 2019-11-01 DIAGNOSIS — M6281 Muscle weakness (generalized): Secondary | ICD-10-CM | POA: Diagnosis not present

## 2019-11-01 DIAGNOSIS — G301 Alzheimer's disease with late onset: Secondary | ICD-10-CM | POA: Diagnosis not present

## 2019-11-04 DIAGNOSIS — M6281 Muscle weakness (generalized): Secondary | ICD-10-CM | POA: Diagnosis not present

## 2019-11-04 DIAGNOSIS — G301 Alzheimer's disease with late onset: Secondary | ICD-10-CM | POA: Diagnosis not present

## 2019-11-05 DIAGNOSIS — M6281 Muscle weakness (generalized): Secondary | ICD-10-CM | POA: Diagnosis not present

## 2019-11-05 DIAGNOSIS — G301 Alzheimer's disease with late onset: Secondary | ICD-10-CM | POA: Diagnosis not present

## 2019-11-06 DIAGNOSIS — G301 Alzheimer's disease with late onset: Secondary | ICD-10-CM | POA: Diagnosis not present

## 2019-11-06 DIAGNOSIS — M6281 Muscle weakness (generalized): Secondary | ICD-10-CM | POA: Diagnosis not present

## 2019-11-07 DIAGNOSIS — G301 Alzheimer's disease with late onset: Secondary | ICD-10-CM | POA: Diagnosis not present

## 2019-11-07 DIAGNOSIS — M6281 Muscle weakness (generalized): Secondary | ICD-10-CM | POA: Diagnosis not present

## 2019-11-08 DIAGNOSIS — M6281 Muscle weakness (generalized): Secondary | ICD-10-CM | POA: Diagnosis not present

## 2019-11-08 DIAGNOSIS — G301 Alzheimer's disease with late onset: Secondary | ICD-10-CM | POA: Diagnosis not present

## 2019-11-15 DIAGNOSIS — Z23 Encounter for immunization: Secondary | ICD-10-CM | POA: Diagnosis not present

## 2019-11-19 DIAGNOSIS — F0390 Unspecified dementia without behavioral disturbance: Secondary | ICD-10-CM | POA: Diagnosis not present

## 2019-11-19 DIAGNOSIS — M6281 Muscle weakness (generalized): Secondary | ICD-10-CM | POA: Diagnosis present

## 2019-11-19 DIAGNOSIS — U071 COVID-19: Secondary | ICD-10-CM | POA: Diagnosis present

## 2019-11-19 DIAGNOSIS — R279 Unspecified lack of coordination: Secondary | ICD-10-CM | POA: Diagnosis present

## 2019-11-19 DIAGNOSIS — R4689 Other symptoms and signs involving appearance and behavior: Secondary | ICD-10-CM | POA: Diagnosis not present

## 2019-11-19 DIAGNOSIS — R1312 Dysphagia, oropharyngeal phase: Secondary | ICD-10-CM | POA: Diagnosis present

## 2019-11-19 DIAGNOSIS — G301 Alzheimer's disease with late onset: Secondary | ICD-10-CM | POA: Diagnosis not present

## 2019-11-19 DIAGNOSIS — R4182 Altered mental status, unspecified: Secondary | ICD-10-CM | POA: Diagnosis not present

## 2019-11-20 DIAGNOSIS — R4689 Other symptoms and signs involving appearance and behavior: Secondary | ICD-10-CM | POA: Diagnosis not present

## 2019-11-20 DIAGNOSIS — U071 COVID-19: Secondary | ICD-10-CM | POA: Diagnosis not present

## 2019-11-20 DIAGNOSIS — F0390 Unspecified dementia without behavioral disturbance: Secondary | ICD-10-CM | POA: Diagnosis not present

## 2019-11-26 DIAGNOSIS — R4689 Other symptoms and signs involving appearance and behavior: Secondary | ICD-10-CM | POA: Diagnosis not present

## 2019-11-26 DIAGNOSIS — G301 Alzheimer's disease with late onset: Secondary | ICD-10-CM | POA: Diagnosis not present

## 2019-11-26 DIAGNOSIS — U071 COVID-19: Secondary | ICD-10-CM | POA: Diagnosis not present

## 2019-11-26 DIAGNOSIS — F0390 Unspecified dementia without behavioral disturbance: Secondary | ICD-10-CM | POA: Diagnosis not present

## 2019-11-27 DIAGNOSIS — G301 Alzheimer's disease with late onset: Secondary | ICD-10-CM | POA: Diagnosis not present

## 2019-11-27 DIAGNOSIS — U071 COVID-19: Secondary | ICD-10-CM | POA: Diagnosis not present

## 2019-11-27 DIAGNOSIS — F0390 Unspecified dementia without behavioral disturbance: Secondary | ICD-10-CM | POA: Diagnosis not present

## 2019-11-27 DIAGNOSIS — R4689 Other symptoms and signs involving appearance and behavior: Secondary | ICD-10-CM | POA: Diagnosis not present

## 2019-12-01 DIAGNOSIS — F0390 Unspecified dementia without behavioral disturbance: Secondary | ICD-10-CM | POA: Diagnosis not present

## 2019-12-01 DIAGNOSIS — G301 Alzheimer's disease with late onset: Secondary | ICD-10-CM | POA: Diagnosis not present

## 2019-12-01 DIAGNOSIS — I10 Essential (primary) hypertension: Secondary | ICD-10-CM | POA: Diagnosis not present

## 2019-12-01 DIAGNOSIS — H409 Unspecified glaucoma: Secondary | ICD-10-CM | POA: Diagnosis not present

## 2019-12-13 DIAGNOSIS — Z23 Encounter for immunization: Secondary | ICD-10-CM | POA: Diagnosis not present

## 2019-12-31 DIAGNOSIS — J9811 Atelectasis: Secondary | ICD-10-CM | POA: Diagnosis not present

## 2020-01-01 DIAGNOSIS — F0390 Unspecified dementia without behavioral disturbance: Secondary | ICD-10-CM | POA: Diagnosis not present

## 2020-01-01 DIAGNOSIS — G301 Alzheimer's disease with late onset: Secondary | ICD-10-CM | POA: Diagnosis not present

## 2020-01-01 DIAGNOSIS — R4689 Other symptoms and signs involving appearance and behavior: Secondary | ICD-10-CM | POA: Diagnosis not present

## 2020-01-05 DIAGNOSIS — F0390 Unspecified dementia without behavioral disturbance: Secondary | ICD-10-CM | POA: Diagnosis not present

## 2020-01-05 DIAGNOSIS — G301 Alzheimer's disease with late onset: Secondary | ICD-10-CM | POA: Diagnosis not present

## 2020-01-05 DIAGNOSIS — R4689 Other symptoms and signs involving appearance and behavior: Secondary | ICD-10-CM | POA: Diagnosis not present

## 2020-01-09 DIAGNOSIS — R279 Unspecified lack of coordination: Secondary | ICD-10-CM | POA: Diagnosis not present

## 2020-01-09 DIAGNOSIS — Z8673 Personal history of transient ischemic attack (TIA), and cerebral infarction without residual deficits: Secondary | ICD-10-CM | POA: Diagnosis not present

## 2020-01-09 DIAGNOSIS — M6259 Muscle wasting and atrophy, not elsewhere classified, multiple sites: Secondary | ICD-10-CM | POA: Diagnosis not present

## 2020-01-10 DIAGNOSIS — Z8673 Personal history of transient ischemic attack (TIA), and cerebral infarction without residual deficits: Secondary | ICD-10-CM | POA: Diagnosis not present

## 2020-01-10 DIAGNOSIS — R279 Unspecified lack of coordination: Secondary | ICD-10-CM | POA: Diagnosis not present

## 2020-01-10 DIAGNOSIS — M6259 Muscle wasting and atrophy, not elsewhere classified, multiple sites: Secondary | ICD-10-CM | POA: Diagnosis not present

## 2020-01-11 DIAGNOSIS — M6259 Muscle wasting and atrophy, not elsewhere classified, multiple sites: Secondary | ICD-10-CM | POA: Diagnosis not present

## 2020-01-11 DIAGNOSIS — R279 Unspecified lack of coordination: Secondary | ICD-10-CM | POA: Diagnosis not present

## 2020-01-11 DIAGNOSIS — Z8673 Personal history of transient ischemic attack (TIA), and cerebral infarction without residual deficits: Secondary | ICD-10-CM | POA: Diagnosis not present

## 2020-01-12 DIAGNOSIS — Z8673 Personal history of transient ischemic attack (TIA), and cerebral infarction without residual deficits: Secondary | ICD-10-CM | POA: Diagnosis not present

## 2020-01-12 DIAGNOSIS — M6259 Muscle wasting and atrophy, not elsewhere classified, multiple sites: Secondary | ICD-10-CM | POA: Diagnosis not present

## 2020-01-12 DIAGNOSIS — R279 Unspecified lack of coordination: Secondary | ICD-10-CM | POA: Diagnosis not present

## 2020-01-15 DIAGNOSIS — M6259 Muscle wasting and atrophy, not elsewhere classified, multiple sites: Secondary | ICD-10-CM | POA: Diagnosis not present

## 2020-01-15 DIAGNOSIS — R279 Unspecified lack of coordination: Secondary | ICD-10-CM | POA: Diagnosis not present

## 2020-01-15 DIAGNOSIS — Z8673 Personal history of transient ischemic attack (TIA), and cerebral infarction without residual deficits: Secondary | ICD-10-CM | POA: Diagnosis not present

## 2020-01-16 DIAGNOSIS — M6259 Muscle wasting and atrophy, not elsewhere classified, multiple sites: Secondary | ICD-10-CM | POA: Diagnosis not present

## 2020-01-16 DIAGNOSIS — R279 Unspecified lack of coordination: Secondary | ICD-10-CM | POA: Diagnosis not present

## 2020-01-16 DIAGNOSIS — Z8673 Personal history of transient ischemic attack (TIA), and cerebral infarction without residual deficits: Secondary | ICD-10-CM | POA: Diagnosis not present

## 2020-01-18 DIAGNOSIS — R279 Unspecified lack of coordination: Secondary | ICD-10-CM | POA: Diagnosis not present

## 2020-01-18 DIAGNOSIS — M6259 Muscle wasting and atrophy, not elsewhere classified, multiple sites: Secondary | ICD-10-CM | POA: Diagnosis not present

## 2020-01-18 DIAGNOSIS — Z8673 Personal history of transient ischemic attack (TIA), and cerebral infarction without residual deficits: Secondary | ICD-10-CM | POA: Diagnosis not present

## 2020-01-22 DIAGNOSIS — M6259 Muscle wasting and atrophy, not elsewhere classified, multiple sites: Secondary | ICD-10-CM | POA: Diagnosis not present

## 2020-01-22 DIAGNOSIS — Z8673 Personal history of transient ischemic attack (TIA), and cerebral infarction without residual deficits: Secondary | ICD-10-CM | POA: Diagnosis not present

## 2020-01-22 DIAGNOSIS — R279 Unspecified lack of coordination: Secondary | ICD-10-CM | POA: Diagnosis not present

## 2020-01-24 DIAGNOSIS — R279 Unspecified lack of coordination: Secondary | ICD-10-CM | POA: Diagnosis not present

## 2020-01-24 DIAGNOSIS — Z8673 Personal history of transient ischemic attack (TIA), and cerebral infarction without residual deficits: Secondary | ICD-10-CM | POA: Diagnosis not present

## 2020-01-24 DIAGNOSIS — M6259 Muscle wasting and atrophy, not elsewhere classified, multiple sites: Secondary | ICD-10-CM | POA: Diagnosis not present

## 2020-01-25 DIAGNOSIS — R279 Unspecified lack of coordination: Secondary | ICD-10-CM | POA: Diagnosis not present

## 2020-01-25 DIAGNOSIS — M6259 Muscle wasting and atrophy, not elsewhere classified, multiple sites: Secondary | ICD-10-CM | POA: Diagnosis not present

## 2020-01-25 DIAGNOSIS — Z8673 Personal history of transient ischemic attack (TIA), and cerebral infarction without residual deficits: Secondary | ICD-10-CM | POA: Diagnosis not present

## 2020-01-29 DIAGNOSIS — H409 Unspecified glaucoma: Secondary | ICD-10-CM | POA: Diagnosis not present

## 2020-01-29 DIAGNOSIS — G301 Alzheimer's disease with late onset: Secondary | ICD-10-CM | POA: Diagnosis not present

## 2020-01-29 DIAGNOSIS — F0390 Unspecified dementia without behavioral disturbance: Secondary | ICD-10-CM | POA: Diagnosis not present

## 2020-01-29 DIAGNOSIS — M6259 Muscle wasting and atrophy, not elsewhere classified, multiple sites: Secondary | ICD-10-CM | POA: Diagnosis not present

## 2020-01-29 DIAGNOSIS — Z8673 Personal history of transient ischemic attack (TIA), and cerebral infarction without residual deficits: Secondary | ICD-10-CM | POA: Diagnosis not present

## 2020-01-29 DIAGNOSIS — I10 Essential (primary) hypertension: Secondary | ICD-10-CM | POA: Diagnosis not present

## 2020-01-29 DIAGNOSIS — R279 Unspecified lack of coordination: Secondary | ICD-10-CM | POA: Diagnosis not present

## 2020-01-30 DIAGNOSIS — R279 Unspecified lack of coordination: Secondary | ICD-10-CM | POA: Diagnosis not present

## 2020-01-30 DIAGNOSIS — Z8673 Personal history of transient ischemic attack (TIA), and cerebral infarction without residual deficits: Secondary | ICD-10-CM | POA: Diagnosis not present

## 2020-01-30 DIAGNOSIS — M6259 Muscle wasting and atrophy, not elsewhere classified, multiple sites: Secondary | ICD-10-CM | POA: Diagnosis not present

## 2020-01-31 DIAGNOSIS — M6259 Muscle wasting and atrophy, not elsewhere classified, multiple sites: Secondary | ICD-10-CM | POA: Diagnosis not present

## 2020-01-31 DIAGNOSIS — Z8673 Personal history of transient ischemic attack (TIA), and cerebral infarction without residual deficits: Secondary | ICD-10-CM | POA: Diagnosis not present

## 2020-01-31 DIAGNOSIS — R279 Unspecified lack of coordination: Secondary | ICD-10-CM | POA: Diagnosis not present

## 2020-02-01 DIAGNOSIS — M6259 Muscle wasting and atrophy, not elsewhere classified, multiple sites: Secondary | ICD-10-CM | POA: Diagnosis not present

## 2020-02-01 DIAGNOSIS — R279 Unspecified lack of coordination: Secondary | ICD-10-CM | POA: Diagnosis not present

## 2020-02-01 DIAGNOSIS — Z8673 Personal history of transient ischemic attack (TIA), and cerebral infarction without residual deficits: Secondary | ICD-10-CM | POA: Diagnosis not present

## 2020-02-02 DIAGNOSIS — M6259 Muscle wasting and atrophy, not elsewhere classified, multiple sites: Secondary | ICD-10-CM | POA: Diagnosis not present

## 2020-02-02 DIAGNOSIS — Z8673 Personal history of transient ischemic attack (TIA), and cerebral infarction without residual deficits: Secondary | ICD-10-CM | POA: Diagnosis not present

## 2020-02-02 DIAGNOSIS — R279 Unspecified lack of coordination: Secondary | ICD-10-CM | POA: Diagnosis not present

## 2020-02-05 DIAGNOSIS — Z8673 Personal history of transient ischemic attack (TIA), and cerebral infarction without residual deficits: Secondary | ICD-10-CM | POA: Diagnosis not present

## 2020-02-05 DIAGNOSIS — M6259 Muscle wasting and atrophy, not elsewhere classified, multiple sites: Secondary | ICD-10-CM | POA: Diagnosis not present

## 2020-02-05 DIAGNOSIS — R279 Unspecified lack of coordination: Secondary | ICD-10-CM | POA: Diagnosis not present

## 2020-02-06 DIAGNOSIS — M6259 Muscle wasting and atrophy, not elsewhere classified, multiple sites: Secondary | ICD-10-CM | POA: Diagnosis not present

## 2020-02-06 DIAGNOSIS — Z8673 Personal history of transient ischemic attack (TIA), and cerebral infarction without residual deficits: Secondary | ICD-10-CM | POA: Diagnosis not present

## 2020-02-06 DIAGNOSIS — R279 Unspecified lack of coordination: Secondary | ICD-10-CM | POA: Diagnosis not present

## 2020-02-07 DIAGNOSIS — R279 Unspecified lack of coordination: Secondary | ICD-10-CM | POA: Diagnosis not present

## 2020-02-07 DIAGNOSIS — M6259 Muscle wasting and atrophy, not elsewhere classified, multiple sites: Secondary | ICD-10-CM | POA: Diagnosis not present

## 2020-02-07 DIAGNOSIS — Z8673 Personal history of transient ischemic attack (TIA), and cerebral infarction without residual deficits: Secondary | ICD-10-CM | POA: Diagnosis not present

## 2020-02-08 DIAGNOSIS — M6259 Muscle wasting and atrophy, not elsewhere classified, multiple sites: Secondary | ICD-10-CM | POA: Diagnosis not present

## 2020-02-08 DIAGNOSIS — R279 Unspecified lack of coordination: Secondary | ICD-10-CM | POA: Diagnosis not present

## 2020-02-08 DIAGNOSIS — Z8673 Personal history of transient ischemic attack (TIA), and cerebral infarction without residual deficits: Secondary | ICD-10-CM | POA: Diagnosis not present

## 2020-02-09 DIAGNOSIS — Z8673 Personal history of transient ischemic attack (TIA), and cerebral infarction without residual deficits: Secondary | ICD-10-CM | POA: Diagnosis not present

## 2020-02-09 DIAGNOSIS — R279 Unspecified lack of coordination: Secondary | ICD-10-CM | POA: Diagnosis not present

## 2020-02-09 DIAGNOSIS — M6259 Muscle wasting and atrophy, not elsewhere classified, multiple sites: Secondary | ICD-10-CM | POA: Diagnosis not present

## 2020-02-12 DIAGNOSIS — Z8673 Personal history of transient ischemic attack (TIA), and cerebral infarction without residual deficits: Secondary | ICD-10-CM | POA: Diagnosis not present

## 2020-02-12 DIAGNOSIS — M6259 Muscle wasting and atrophy, not elsewhere classified, multiple sites: Secondary | ICD-10-CM | POA: Diagnosis not present

## 2020-02-12 DIAGNOSIS — R279 Unspecified lack of coordination: Secondary | ICD-10-CM | POA: Diagnosis not present

## 2020-02-13 DIAGNOSIS — Z8673 Personal history of transient ischemic attack (TIA), and cerebral infarction without residual deficits: Secondary | ICD-10-CM | POA: Diagnosis not present

## 2020-02-13 DIAGNOSIS — R279 Unspecified lack of coordination: Secondary | ICD-10-CM | POA: Diagnosis not present

## 2020-02-13 DIAGNOSIS — M6259 Muscle wasting and atrophy, not elsewhere classified, multiple sites: Secondary | ICD-10-CM | POA: Diagnosis not present

## 2020-04-02 DIAGNOSIS — M6259 Muscle wasting and atrophy, not elsewhere classified, multiple sites: Secondary | ICD-10-CM | POA: Diagnosis not present

## 2020-04-02 DIAGNOSIS — I10 Essential (primary) hypertension: Secondary | ICD-10-CM | POA: Diagnosis not present

## 2020-04-02 DIAGNOSIS — I679 Cerebrovascular disease, unspecified: Secondary | ICD-10-CM | POA: Diagnosis not present

## 2020-04-03 DIAGNOSIS — I679 Cerebrovascular disease, unspecified: Secondary | ICD-10-CM | POA: Diagnosis not present

## 2020-04-03 DIAGNOSIS — M6259 Muscle wasting and atrophy, not elsewhere classified, multiple sites: Secondary | ICD-10-CM | POA: Diagnosis not present

## 2020-04-03 DIAGNOSIS — I10 Essential (primary) hypertension: Secondary | ICD-10-CM | POA: Diagnosis not present

## 2020-04-04 DIAGNOSIS — I10 Essential (primary) hypertension: Secondary | ICD-10-CM | POA: Diagnosis not present

## 2020-04-04 DIAGNOSIS — M6259 Muscle wasting and atrophy, not elsewhere classified, multiple sites: Secondary | ICD-10-CM | POA: Diagnosis not present

## 2020-04-04 DIAGNOSIS — I679 Cerebrovascular disease, unspecified: Secondary | ICD-10-CM | POA: Diagnosis not present

## 2020-04-05 DIAGNOSIS — I679 Cerebrovascular disease, unspecified: Secondary | ICD-10-CM | POA: Diagnosis not present

## 2020-04-05 DIAGNOSIS — I10 Essential (primary) hypertension: Secondary | ICD-10-CM | POA: Diagnosis not present

## 2020-04-05 DIAGNOSIS — M6259 Muscle wasting and atrophy, not elsewhere classified, multiple sites: Secondary | ICD-10-CM | POA: Diagnosis not present

## 2020-04-08 DIAGNOSIS — I679 Cerebrovascular disease, unspecified: Secondary | ICD-10-CM | POA: Diagnosis not present

## 2020-04-08 DIAGNOSIS — I10 Essential (primary) hypertension: Secondary | ICD-10-CM | POA: Diagnosis not present

## 2020-04-08 DIAGNOSIS — M6259 Muscle wasting and atrophy, not elsewhere classified, multiple sites: Secondary | ICD-10-CM | POA: Diagnosis not present

## 2020-04-11 DIAGNOSIS — M6259 Muscle wasting and atrophy, not elsewhere classified, multiple sites: Secondary | ICD-10-CM | POA: Diagnosis not present

## 2020-04-11 DIAGNOSIS — R471 Dysarthria and anarthria: Secondary | ICD-10-CM | POA: Diagnosis not present

## 2020-04-11 DIAGNOSIS — I10 Essential (primary) hypertension: Secondary | ICD-10-CM | POA: Diagnosis not present

## 2020-04-11 DIAGNOSIS — I679 Cerebrovascular disease, unspecified: Secondary | ICD-10-CM | POA: Diagnosis not present

## 2020-04-11 DIAGNOSIS — F0390 Unspecified dementia without behavioral disturbance: Secondary | ICD-10-CM | POA: Diagnosis not present

## 2020-04-12 DIAGNOSIS — F0390 Unspecified dementia without behavioral disturbance: Secondary | ICD-10-CM | POA: Diagnosis not present

## 2020-04-12 DIAGNOSIS — M6259 Muscle wasting and atrophy, not elsewhere classified, multiple sites: Secondary | ICD-10-CM | POA: Diagnosis not present

## 2020-04-12 DIAGNOSIS — I679 Cerebrovascular disease, unspecified: Secondary | ICD-10-CM | POA: Diagnosis not present

## 2020-04-12 DIAGNOSIS — R471 Dysarthria and anarthria: Secondary | ICD-10-CM | POA: Diagnosis not present

## 2020-04-12 DIAGNOSIS — I10 Essential (primary) hypertension: Secondary | ICD-10-CM | POA: Diagnosis not present

## 2020-04-13 DIAGNOSIS — I679 Cerebrovascular disease, unspecified: Secondary | ICD-10-CM | POA: Diagnosis not present

## 2020-04-13 DIAGNOSIS — I10 Essential (primary) hypertension: Secondary | ICD-10-CM | POA: Diagnosis not present

## 2020-04-13 DIAGNOSIS — R471 Dysarthria and anarthria: Secondary | ICD-10-CM | POA: Diagnosis not present

## 2020-04-13 DIAGNOSIS — F0390 Unspecified dementia without behavioral disturbance: Secondary | ICD-10-CM | POA: Diagnosis not present

## 2020-04-13 DIAGNOSIS — M6259 Muscle wasting and atrophy, not elsewhere classified, multiple sites: Secondary | ICD-10-CM | POA: Diagnosis not present

## 2020-04-22 DIAGNOSIS — I679 Cerebrovascular disease, unspecified: Secondary | ICD-10-CM | POA: Diagnosis not present

## 2020-04-22 DIAGNOSIS — R471 Dysarthria and anarthria: Secondary | ICD-10-CM | POA: Diagnosis not present

## 2020-04-22 DIAGNOSIS — M6259 Muscle wasting and atrophy, not elsewhere classified, multiple sites: Secondary | ICD-10-CM | POA: Diagnosis not present

## 2020-04-22 DIAGNOSIS — F0390 Unspecified dementia without behavioral disturbance: Secondary | ICD-10-CM | POA: Diagnosis not present

## 2020-04-22 DIAGNOSIS — I10 Essential (primary) hypertension: Secondary | ICD-10-CM | POA: Diagnosis not present

## 2020-04-25 DIAGNOSIS — I10 Essential (primary) hypertension: Secondary | ICD-10-CM | POA: Diagnosis not present

## 2020-04-25 DIAGNOSIS — R471 Dysarthria and anarthria: Secondary | ICD-10-CM | POA: Diagnosis not present

## 2020-04-25 DIAGNOSIS — I679 Cerebrovascular disease, unspecified: Secondary | ICD-10-CM | POA: Diagnosis not present

## 2020-04-25 DIAGNOSIS — F0390 Unspecified dementia without behavioral disturbance: Secondary | ICD-10-CM | POA: Diagnosis not present

## 2020-04-25 DIAGNOSIS — M6259 Muscle wasting and atrophy, not elsewhere classified, multiple sites: Secondary | ICD-10-CM | POA: Diagnosis not present

## 2020-04-30 DIAGNOSIS — R471 Dysarthria and anarthria: Secondary | ICD-10-CM | POA: Diagnosis not present

## 2020-04-30 DIAGNOSIS — F0390 Unspecified dementia without behavioral disturbance: Secondary | ICD-10-CM | POA: Diagnosis not present

## 2020-04-30 DIAGNOSIS — M6259 Muscle wasting and atrophy, not elsewhere classified, multiple sites: Secondary | ICD-10-CM | POA: Diagnosis not present

## 2020-04-30 DIAGNOSIS — I10 Essential (primary) hypertension: Secondary | ICD-10-CM | POA: Diagnosis not present

## 2020-04-30 DIAGNOSIS — I679 Cerebrovascular disease, unspecified: Secondary | ICD-10-CM | POA: Diagnosis not present

## 2020-05-01 DIAGNOSIS — I679 Cerebrovascular disease, unspecified: Secondary | ICD-10-CM | POA: Diagnosis not present

## 2020-05-01 DIAGNOSIS — I10 Essential (primary) hypertension: Secondary | ICD-10-CM | POA: Diagnosis not present

## 2020-05-01 DIAGNOSIS — F0390 Unspecified dementia without behavioral disturbance: Secondary | ICD-10-CM | POA: Diagnosis not present

## 2020-05-01 DIAGNOSIS — M6259 Muscle wasting and atrophy, not elsewhere classified, multiple sites: Secondary | ICD-10-CM | POA: Diagnosis not present

## 2020-05-01 DIAGNOSIS — R471 Dysarthria and anarthria: Secondary | ICD-10-CM | POA: Diagnosis not present

## 2020-05-02 DIAGNOSIS — I10 Essential (primary) hypertension: Secondary | ICD-10-CM | POA: Diagnosis not present

## 2020-05-02 DIAGNOSIS — M6259 Muscle wasting and atrophy, not elsewhere classified, multiple sites: Secondary | ICD-10-CM | POA: Diagnosis not present

## 2020-05-02 DIAGNOSIS — F0390 Unspecified dementia without behavioral disturbance: Secondary | ICD-10-CM | POA: Diagnosis not present

## 2020-05-02 DIAGNOSIS — I679 Cerebrovascular disease, unspecified: Secondary | ICD-10-CM | POA: Diagnosis not present

## 2020-05-02 DIAGNOSIS — R471 Dysarthria and anarthria: Secondary | ICD-10-CM | POA: Diagnosis not present

## 2020-05-03 DIAGNOSIS — M6259 Muscle wasting and atrophy, not elsewhere classified, multiple sites: Secondary | ICD-10-CM | POA: Diagnosis not present

## 2020-05-03 DIAGNOSIS — I679 Cerebrovascular disease, unspecified: Secondary | ICD-10-CM | POA: Diagnosis not present

## 2020-05-03 DIAGNOSIS — R471 Dysarthria and anarthria: Secondary | ICD-10-CM | POA: Diagnosis not present

## 2020-05-03 DIAGNOSIS — I10 Essential (primary) hypertension: Secondary | ICD-10-CM | POA: Diagnosis not present

## 2020-05-03 DIAGNOSIS — F0390 Unspecified dementia without behavioral disturbance: Secondary | ICD-10-CM | POA: Diagnosis not present

## 2020-05-05 DIAGNOSIS — I10 Essential (primary) hypertension: Secondary | ICD-10-CM | POA: Diagnosis not present

## 2020-05-05 DIAGNOSIS — I679 Cerebrovascular disease, unspecified: Secondary | ICD-10-CM | POA: Diagnosis not present

## 2020-05-05 DIAGNOSIS — M6259 Muscle wasting and atrophy, not elsewhere classified, multiple sites: Secondary | ICD-10-CM | POA: Diagnosis not present

## 2020-05-05 DIAGNOSIS — F0390 Unspecified dementia without behavioral disturbance: Secondary | ICD-10-CM | POA: Diagnosis not present

## 2020-05-05 DIAGNOSIS — R471 Dysarthria and anarthria: Secondary | ICD-10-CM | POA: Diagnosis not present

## 2020-05-06 DIAGNOSIS — F0390 Unspecified dementia without behavioral disturbance: Secondary | ICD-10-CM | POA: Diagnosis not present

## 2020-05-06 DIAGNOSIS — R471 Dysarthria and anarthria: Secondary | ICD-10-CM | POA: Diagnosis not present

## 2020-05-06 DIAGNOSIS — I679 Cerebrovascular disease, unspecified: Secondary | ICD-10-CM | POA: Diagnosis not present

## 2020-05-06 DIAGNOSIS — I10 Essential (primary) hypertension: Secondary | ICD-10-CM | POA: Diagnosis not present

## 2020-05-06 DIAGNOSIS — M6259 Muscle wasting and atrophy, not elsewhere classified, multiple sites: Secondary | ICD-10-CM | POA: Diagnosis not present

## 2020-05-07 DIAGNOSIS — M6259 Muscle wasting and atrophy, not elsewhere classified, multiple sites: Secondary | ICD-10-CM | POA: Diagnosis not present

## 2020-05-07 DIAGNOSIS — I679 Cerebrovascular disease, unspecified: Secondary | ICD-10-CM | POA: Diagnosis not present

## 2020-05-07 DIAGNOSIS — R471 Dysarthria and anarthria: Secondary | ICD-10-CM | POA: Diagnosis not present

## 2020-05-07 DIAGNOSIS — F0390 Unspecified dementia without behavioral disturbance: Secondary | ICD-10-CM | POA: Diagnosis not present

## 2020-05-07 DIAGNOSIS — I10 Essential (primary) hypertension: Secondary | ICD-10-CM | POA: Diagnosis not present

## 2020-05-08 DIAGNOSIS — I679 Cerebrovascular disease, unspecified: Secondary | ICD-10-CM | POA: Diagnosis not present

## 2020-05-08 DIAGNOSIS — R471 Dysarthria and anarthria: Secondary | ICD-10-CM | POA: Diagnosis not present

## 2020-05-08 DIAGNOSIS — F0390 Unspecified dementia without behavioral disturbance: Secondary | ICD-10-CM | POA: Diagnosis not present

## 2020-05-08 DIAGNOSIS — M6259 Muscle wasting and atrophy, not elsewhere classified, multiple sites: Secondary | ICD-10-CM | POA: Diagnosis not present

## 2020-05-08 DIAGNOSIS — I10 Essential (primary) hypertension: Secondary | ICD-10-CM | POA: Diagnosis not present

## 2020-05-09 DIAGNOSIS — F0390 Unspecified dementia without behavioral disturbance: Secondary | ICD-10-CM | POA: Diagnosis not present

## 2020-05-09 DIAGNOSIS — I10 Essential (primary) hypertension: Secondary | ICD-10-CM | POA: Diagnosis not present

## 2020-05-09 DIAGNOSIS — R471 Dysarthria and anarthria: Secondary | ICD-10-CM | POA: Diagnosis not present

## 2020-05-09 DIAGNOSIS — M6259 Muscle wasting and atrophy, not elsewhere classified, multiple sites: Secondary | ICD-10-CM | POA: Diagnosis not present

## 2020-05-09 DIAGNOSIS — I679 Cerebrovascular disease, unspecified: Secondary | ICD-10-CM | POA: Diagnosis not present

## 2020-05-14 DIAGNOSIS — M6259 Muscle wasting and atrophy, not elsewhere classified, multiple sites: Secondary | ICD-10-CM | POA: Diagnosis not present

## 2020-05-14 DIAGNOSIS — F0390 Unspecified dementia without behavioral disturbance: Secondary | ICD-10-CM | POA: Diagnosis not present

## 2020-05-14 DIAGNOSIS — I10 Essential (primary) hypertension: Secondary | ICD-10-CM | POA: Diagnosis not present

## 2020-05-14 DIAGNOSIS — R471 Dysarthria and anarthria: Secondary | ICD-10-CM | POA: Diagnosis not present

## 2020-05-14 DIAGNOSIS — I679 Cerebrovascular disease, unspecified: Secondary | ICD-10-CM | POA: Diagnosis not present

## 2020-05-15 DIAGNOSIS — M6259 Muscle wasting and atrophy, not elsewhere classified, multiple sites: Secondary | ICD-10-CM | POA: Diagnosis not present

## 2020-05-15 DIAGNOSIS — I10 Essential (primary) hypertension: Secondary | ICD-10-CM | POA: Diagnosis not present

## 2020-05-15 DIAGNOSIS — R471 Dysarthria and anarthria: Secondary | ICD-10-CM | POA: Diagnosis not present

## 2020-05-15 DIAGNOSIS — I679 Cerebrovascular disease, unspecified: Secondary | ICD-10-CM | POA: Diagnosis not present

## 2020-05-15 DIAGNOSIS — F0390 Unspecified dementia without behavioral disturbance: Secondary | ICD-10-CM | POA: Diagnosis not present

## 2020-05-16 DIAGNOSIS — I10 Essential (primary) hypertension: Secondary | ICD-10-CM | POA: Diagnosis not present

## 2020-05-16 DIAGNOSIS — R471 Dysarthria and anarthria: Secondary | ICD-10-CM | POA: Diagnosis not present

## 2020-05-16 DIAGNOSIS — M6259 Muscle wasting and atrophy, not elsewhere classified, multiple sites: Secondary | ICD-10-CM | POA: Diagnosis not present

## 2020-05-16 DIAGNOSIS — F0390 Unspecified dementia without behavioral disturbance: Secondary | ICD-10-CM | POA: Diagnosis not present

## 2020-05-16 DIAGNOSIS — I679 Cerebrovascular disease, unspecified: Secondary | ICD-10-CM | POA: Diagnosis not present

## 2020-05-17 DIAGNOSIS — I679 Cerebrovascular disease, unspecified: Secondary | ICD-10-CM | POA: Diagnosis not present

## 2020-05-17 DIAGNOSIS — F0390 Unspecified dementia without behavioral disturbance: Secondary | ICD-10-CM | POA: Diagnosis not present

## 2020-05-17 DIAGNOSIS — M6259 Muscle wasting and atrophy, not elsewhere classified, multiple sites: Secondary | ICD-10-CM | POA: Diagnosis not present

## 2020-05-17 DIAGNOSIS — I10 Essential (primary) hypertension: Secondary | ICD-10-CM | POA: Diagnosis not present

## 2020-05-17 DIAGNOSIS — R471 Dysarthria and anarthria: Secondary | ICD-10-CM | POA: Diagnosis not present

## 2020-05-20 DIAGNOSIS — R471 Dysarthria and anarthria: Secondary | ICD-10-CM | POA: Diagnosis not present

## 2020-05-20 DIAGNOSIS — I679 Cerebrovascular disease, unspecified: Secondary | ICD-10-CM | POA: Diagnosis not present

## 2020-05-20 DIAGNOSIS — F0390 Unspecified dementia without behavioral disturbance: Secondary | ICD-10-CM | POA: Diagnosis not present

## 2020-05-20 DIAGNOSIS — I10 Essential (primary) hypertension: Secondary | ICD-10-CM | POA: Diagnosis not present

## 2020-05-20 DIAGNOSIS — M6259 Muscle wasting and atrophy, not elsewhere classified, multiple sites: Secondary | ICD-10-CM | POA: Diagnosis not present

## 2020-05-21 DIAGNOSIS — I10 Essential (primary) hypertension: Secondary | ICD-10-CM | POA: Diagnosis not present

## 2020-05-21 DIAGNOSIS — M6259 Muscle wasting and atrophy, not elsewhere classified, multiple sites: Secondary | ICD-10-CM | POA: Diagnosis not present

## 2020-05-21 DIAGNOSIS — R471 Dysarthria and anarthria: Secondary | ICD-10-CM | POA: Diagnosis not present

## 2020-05-21 DIAGNOSIS — F0390 Unspecified dementia without behavioral disturbance: Secondary | ICD-10-CM | POA: Diagnosis not present

## 2020-05-21 DIAGNOSIS — I679 Cerebrovascular disease, unspecified: Secondary | ICD-10-CM | POA: Diagnosis not present

## 2020-05-22 DIAGNOSIS — I679 Cerebrovascular disease, unspecified: Secondary | ICD-10-CM | POA: Diagnosis not present

## 2020-05-22 DIAGNOSIS — I10 Essential (primary) hypertension: Secondary | ICD-10-CM | POA: Diagnosis not present

## 2020-05-22 DIAGNOSIS — R471 Dysarthria and anarthria: Secondary | ICD-10-CM | POA: Diagnosis not present

## 2020-05-22 DIAGNOSIS — F0390 Unspecified dementia without behavioral disturbance: Secondary | ICD-10-CM | POA: Diagnosis not present

## 2020-05-22 DIAGNOSIS — M6259 Muscle wasting and atrophy, not elsewhere classified, multiple sites: Secondary | ICD-10-CM | POA: Diagnosis not present

## 2020-05-23 DIAGNOSIS — I10 Essential (primary) hypertension: Secondary | ICD-10-CM | POA: Diagnosis not present

## 2020-05-23 DIAGNOSIS — M6259 Muscle wasting and atrophy, not elsewhere classified, multiple sites: Secondary | ICD-10-CM | POA: Diagnosis not present

## 2020-05-23 DIAGNOSIS — R471 Dysarthria and anarthria: Secondary | ICD-10-CM | POA: Diagnosis not present

## 2020-05-23 DIAGNOSIS — I679 Cerebrovascular disease, unspecified: Secondary | ICD-10-CM | POA: Diagnosis not present

## 2020-05-23 DIAGNOSIS — F0390 Unspecified dementia without behavioral disturbance: Secondary | ICD-10-CM | POA: Diagnosis not present

## 2020-05-24 DIAGNOSIS — R471 Dysarthria and anarthria: Secondary | ICD-10-CM | POA: Diagnosis not present

## 2020-05-24 DIAGNOSIS — I679 Cerebrovascular disease, unspecified: Secondary | ICD-10-CM | POA: Diagnosis not present

## 2020-05-24 DIAGNOSIS — I10 Essential (primary) hypertension: Secondary | ICD-10-CM | POA: Diagnosis not present

## 2020-05-24 DIAGNOSIS — M6259 Muscle wasting and atrophy, not elsewhere classified, multiple sites: Secondary | ICD-10-CM | POA: Diagnosis not present

## 2020-05-24 DIAGNOSIS — F0390 Unspecified dementia without behavioral disturbance: Secondary | ICD-10-CM | POA: Diagnosis not present

## 2020-05-27 DIAGNOSIS — M6259 Muscle wasting and atrophy, not elsewhere classified, multiple sites: Secondary | ICD-10-CM | POA: Diagnosis not present

## 2020-05-27 DIAGNOSIS — I679 Cerebrovascular disease, unspecified: Secondary | ICD-10-CM | POA: Diagnosis not present

## 2020-05-27 DIAGNOSIS — R471 Dysarthria and anarthria: Secondary | ICD-10-CM | POA: Diagnosis not present

## 2020-05-27 DIAGNOSIS — I10 Essential (primary) hypertension: Secondary | ICD-10-CM | POA: Diagnosis not present

## 2020-05-27 DIAGNOSIS — F0390 Unspecified dementia without behavioral disturbance: Secondary | ICD-10-CM | POA: Diagnosis not present

## 2020-05-28 DIAGNOSIS — F0390 Unspecified dementia without behavioral disturbance: Secondary | ICD-10-CM | POA: Diagnosis not present

## 2020-05-28 DIAGNOSIS — R471 Dysarthria and anarthria: Secondary | ICD-10-CM | POA: Diagnosis not present

## 2020-05-28 DIAGNOSIS — I10 Essential (primary) hypertension: Secondary | ICD-10-CM | POA: Diagnosis not present

## 2020-05-28 DIAGNOSIS — M6259 Muscle wasting and atrophy, not elsewhere classified, multiple sites: Secondary | ICD-10-CM | POA: Diagnosis not present

## 2020-05-28 DIAGNOSIS — I679 Cerebrovascular disease, unspecified: Secondary | ICD-10-CM | POA: Diagnosis not present

## 2020-05-29 DIAGNOSIS — R471 Dysarthria and anarthria: Secondary | ICD-10-CM | POA: Diagnosis not present

## 2020-05-29 DIAGNOSIS — I679 Cerebrovascular disease, unspecified: Secondary | ICD-10-CM | POA: Diagnosis not present

## 2020-05-29 DIAGNOSIS — F0390 Unspecified dementia without behavioral disturbance: Secondary | ICD-10-CM | POA: Diagnosis not present

## 2020-05-29 DIAGNOSIS — I10 Essential (primary) hypertension: Secondary | ICD-10-CM | POA: Diagnosis not present

## 2020-05-29 DIAGNOSIS — M6259 Muscle wasting and atrophy, not elsewhere classified, multiple sites: Secondary | ICD-10-CM | POA: Diagnosis not present

## 2020-05-31 DIAGNOSIS — G301 Alzheimer's disease with late onset: Secondary | ICD-10-CM | POA: Diagnosis not present

## 2020-05-31 DIAGNOSIS — I10 Essential (primary) hypertension: Secondary | ICD-10-CM | POA: Diagnosis not present

## 2020-05-31 DIAGNOSIS — H409 Unspecified glaucoma: Secondary | ICD-10-CM | POA: Diagnosis not present

## 2020-05-31 DIAGNOSIS — F0390 Unspecified dementia without behavioral disturbance: Secondary | ICD-10-CM | POA: Diagnosis not present

## 2020-05-31 DIAGNOSIS — I679 Cerebrovascular disease, unspecified: Secondary | ICD-10-CM | POA: Diagnosis not present

## 2020-05-31 DIAGNOSIS — D649 Anemia, unspecified: Secondary | ICD-10-CM | POA: Diagnosis not present

## 2020-05-31 DIAGNOSIS — N400 Enlarged prostate without lower urinary tract symptoms: Secondary | ICD-10-CM | POA: Diagnosis not present

## 2020-07-18 DIAGNOSIS — M6259 Muscle wasting and atrophy, not elsewhere classified, multiple sites: Secondary | ICD-10-CM | POA: Diagnosis not present

## 2020-07-18 DIAGNOSIS — I679 Cerebrovascular disease, unspecified: Secondary | ICD-10-CM | POA: Diagnosis not present

## 2020-07-18 DIAGNOSIS — I10 Essential (primary) hypertension: Secondary | ICD-10-CM | POA: Diagnosis not present

## 2020-07-19 DIAGNOSIS — I10 Essential (primary) hypertension: Secondary | ICD-10-CM | POA: Diagnosis not present

## 2020-07-19 DIAGNOSIS — I679 Cerebrovascular disease, unspecified: Secondary | ICD-10-CM | POA: Diagnosis not present

## 2020-07-19 DIAGNOSIS — M6259 Muscle wasting and atrophy, not elsewhere classified, multiple sites: Secondary | ICD-10-CM | POA: Diagnosis not present

## 2020-07-22 DIAGNOSIS — M6259 Muscle wasting and atrophy, not elsewhere classified, multiple sites: Secondary | ICD-10-CM | POA: Diagnosis not present

## 2020-07-22 DIAGNOSIS — I10 Essential (primary) hypertension: Secondary | ICD-10-CM | POA: Diagnosis not present

## 2020-07-22 DIAGNOSIS — I679 Cerebrovascular disease, unspecified: Secondary | ICD-10-CM | POA: Diagnosis not present

## 2020-07-23 DIAGNOSIS — I679 Cerebrovascular disease, unspecified: Secondary | ICD-10-CM | POA: Diagnosis not present

## 2020-07-23 DIAGNOSIS — I10 Essential (primary) hypertension: Secondary | ICD-10-CM | POA: Diagnosis not present

## 2020-07-23 DIAGNOSIS — M6259 Muscle wasting and atrophy, not elsewhere classified, multiple sites: Secondary | ICD-10-CM | POA: Diagnosis not present

## 2020-07-24 DIAGNOSIS — M6259 Muscle wasting and atrophy, not elsewhere classified, multiple sites: Secondary | ICD-10-CM | POA: Diagnosis not present

## 2020-07-24 DIAGNOSIS — I679 Cerebrovascular disease, unspecified: Secondary | ICD-10-CM | POA: Diagnosis not present

## 2020-07-24 DIAGNOSIS — I10 Essential (primary) hypertension: Secondary | ICD-10-CM | POA: Diagnosis not present

## 2020-07-30 DIAGNOSIS — I10 Essential (primary) hypertension: Secondary | ICD-10-CM | POA: Diagnosis not present

## 2020-07-30 DIAGNOSIS — I679 Cerebrovascular disease, unspecified: Secondary | ICD-10-CM | POA: Diagnosis not present

## 2020-07-30 DIAGNOSIS — M6259 Muscle wasting and atrophy, not elsewhere classified, multiple sites: Secondary | ICD-10-CM | POA: Diagnosis not present

## 2020-08-05 DIAGNOSIS — I679 Cerebrovascular disease, unspecified: Secondary | ICD-10-CM | POA: Diagnosis not present

## 2020-08-05 DIAGNOSIS — M6259 Muscle wasting and atrophy, not elsewhere classified, multiple sites: Secondary | ICD-10-CM | POA: Diagnosis not present

## 2020-08-05 DIAGNOSIS — I10 Essential (primary) hypertension: Secondary | ICD-10-CM | POA: Diagnosis not present

## 2020-08-06 DIAGNOSIS — I10 Essential (primary) hypertension: Secondary | ICD-10-CM | POA: Diagnosis not present

## 2020-08-06 DIAGNOSIS — I679 Cerebrovascular disease, unspecified: Secondary | ICD-10-CM | POA: Diagnosis not present

## 2020-08-06 DIAGNOSIS — M6259 Muscle wasting and atrophy, not elsewhere classified, multiple sites: Secondary | ICD-10-CM | POA: Diagnosis not present

## 2020-08-07 DIAGNOSIS — I10 Essential (primary) hypertension: Secondary | ICD-10-CM | POA: Diagnosis not present

## 2020-08-07 DIAGNOSIS — I679 Cerebrovascular disease, unspecified: Secondary | ICD-10-CM | POA: Diagnosis not present

## 2020-08-07 DIAGNOSIS — M6259 Muscle wasting and atrophy, not elsewhere classified, multiple sites: Secondary | ICD-10-CM | POA: Diagnosis not present

## 2020-08-12 DIAGNOSIS — I679 Cerebrovascular disease, unspecified: Secondary | ICD-10-CM | POA: Diagnosis not present

## 2020-08-12 DIAGNOSIS — I10 Essential (primary) hypertension: Secondary | ICD-10-CM | POA: Diagnosis not present

## 2020-08-12 DIAGNOSIS — M6259 Muscle wasting and atrophy, not elsewhere classified, multiple sites: Secondary | ICD-10-CM | POA: Diagnosis not present

## 2020-08-13 DIAGNOSIS — I679 Cerebrovascular disease, unspecified: Secondary | ICD-10-CM | POA: Diagnosis not present

## 2020-08-13 DIAGNOSIS — F0390 Unspecified dementia without behavioral disturbance: Secondary | ICD-10-CM | POA: Diagnosis not present

## 2020-08-13 DIAGNOSIS — M6259 Muscle wasting and atrophy, not elsewhere classified, multiple sites: Secondary | ICD-10-CM | POA: Diagnosis not present

## 2020-08-13 DIAGNOSIS — F32A Depression, unspecified: Secondary | ICD-10-CM | POA: Diagnosis not present

## 2020-08-13 DIAGNOSIS — I10 Essential (primary) hypertension: Secondary | ICD-10-CM | POA: Diagnosis not present

## 2020-08-13 DIAGNOSIS — R52 Pain, unspecified: Secondary | ICD-10-CM | POA: Diagnosis not present

## 2020-08-15 DIAGNOSIS — M6259 Muscle wasting and atrophy, not elsewhere classified, multiple sites: Secondary | ICD-10-CM | POA: Diagnosis not present

## 2020-08-15 DIAGNOSIS — I679 Cerebrovascular disease, unspecified: Secondary | ICD-10-CM | POA: Diagnosis not present

## 2020-08-15 DIAGNOSIS — I10 Essential (primary) hypertension: Secondary | ICD-10-CM | POA: Diagnosis not present

## 2020-08-19 DIAGNOSIS — R600 Localized edema: Secondary | ICD-10-CM | POA: Diagnosis not present

## 2020-08-19 DIAGNOSIS — I739 Peripheral vascular disease, unspecified: Secondary | ICD-10-CM | POA: Diagnosis not present

## 2020-08-19 DIAGNOSIS — Q845 Enlarged and hypertrophic nails: Secondary | ICD-10-CM | POA: Diagnosis not present

## 2020-08-19 DIAGNOSIS — B351 Tinea unguium: Secondary | ICD-10-CM | POA: Diagnosis not present

## 2020-08-28 DIAGNOSIS — M6259 Muscle wasting and atrophy, not elsewhere classified, multiple sites: Secondary | ICD-10-CM | POA: Diagnosis not present

## 2020-08-28 DIAGNOSIS — I679 Cerebrovascular disease, unspecified: Secondary | ICD-10-CM | POA: Diagnosis not present

## 2020-08-28 DIAGNOSIS — I10 Essential (primary) hypertension: Secondary | ICD-10-CM | POA: Diagnosis not present

## 2020-08-30 DIAGNOSIS — M6259 Muscle wasting and atrophy, not elsewhere classified, multiple sites: Secondary | ICD-10-CM | POA: Diagnosis not present

## 2020-08-30 DIAGNOSIS — I10 Essential (primary) hypertension: Secondary | ICD-10-CM | POA: Diagnosis not present

## 2020-08-30 DIAGNOSIS — I679 Cerebrovascular disease, unspecified: Secondary | ICD-10-CM | POA: Diagnosis not present

## 2020-09-02 DIAGNOSIS — M6259 Muscle wasting and atrophy, not elsewhere classified, multiple sites: Secondary | ICD-10-CM | POA: Diagnosis not present

## 2020-09-02 DIAGNOSIS — I679 Cerebrovascular disease, unspecified: Secondary | ICD-10-CM | POA: Diagnosis not present

## 2020-09-02 DIAGNOSIS — I10 Essential (primary) hypertension: Secondary | ICD-10-CM | POA: Diagnosis not present

## 2020-09-03 DIAGNOSIS — I10 Essential (primary) hypertension: Secondary | ICD-10-CM | POA: Diagnosis not present

## 2020-09-03 DIAGNOSIS — M6259 Muscle wasting and atrophy, not elsewhere classified, multiple sites: Secondary | ICD-10-CM | POA: Diagnosis not present

## 2020-09-03 DIAGNOSIS — I679 Cerebrovascular disease, unspecified: Secondary | ICD-10-CM | POA: Diagnosis not present

## 2020-09-04 DIAGNOSIS — I679 Cerebrovascular disease, unspecified: Secondary | ICD-10-CM | POA: Diagnosis not present

## 2020-09-04 DIAGNOSIS — I10 Essential (primary) hypertension: Secondary | ICD-10-CM | POA: Diagnosis not present

## 2020-09-04 DIAGNOSIS — M6259 Muscle wasting and atrophy, not elsewhere classified, multiple sites: Secondary | ICD-10-CM | POA: Diagnosis not present

## 2020-09-05 DIAGNOSIS — I679 Cerebrovascular disease, unspecified: Secondary | ICD-10-CM | POA: Diagnosis not present

## 2020-09-05 DIAGNOSIS — M6259 Muscle wasting and atrophy, not elsewhere classified, multiple sites: Secondary | ICD-10-CM | POA: Diagnosis not present

## 2020-09-05 DIAGNOSIS — I10 Essential (primary) hypertension: Secondary | ICD-10-CM | POA: Diagnosis not present

## 2020-09-10 DIAGNOSIS — R52 Pain, unspecified: Secondary | ICD-10-CM | POA: Diagnosis not present

## 2020-09-10 DIAGNOSIS — N400 Enlarged prostate without lower urinary tract symptoms: Secondary | ICD-10-CM | POA: Diagnosis not present

## 2020-09-10 DIAGNOSIS — F0390 Unspecified dementia without behavioral disturbance: Secondary | ICD-10-CM | POA: Diagnosis not present

## 2020-09-12 DIAGNOSIS — I10 Essential (primary) hypertension: Secondary | ICD-10-CM | POA: Diagnosis not present

## 2020-09-12 DIAGNOSIS — I679 Cerebrovascular disease, unspecified: Secondary | ICD-10-CM | POA: Diagnosis not present

## 2020-09-12 DIAGNOSIS — M6259 Muscle wasting and atrophy, not elsewhere classified, multiple sites: Secondary | ICD-10-CM | POA: Diagnosis not present

## 2020-09-12 DIAGNOSIS — F0390 Unspecified dementia without behavioral disturbance: Secondary | ICD-10-CM | POA: Diagnosis not present

## 2020-09-12 DIAGNOSIS — Z23 Encounter for immunization: Secondary | ICD-10-CM | POA: Diagnosis not present

## 2020-09-12 DIAGNOSIS — R1312 Dysphagia, oropharyngeal phase: Secondary | ICD-10-CM | POA: Diagnosis not present

## 2020-09-30 DIAGNOSIS — I10 Essential (primary) hypertension: Secondary | ICD-10-CM | POA: Diagnosis not present

## 2020-09-30 DIAGNOSIS — I679 Cerebrovascular disease, unspecified: Secondary | ICD-10-CM | POA: Diagnosis not present

## 2020-09-30 DIAGNOSIS — R1312 Dysphagia, oropharyngeal phase: Secondary | ICD-10-CM | POA: Diagnosis not present

## 2020-09-30 DIAGNOSIS — M6259 Muscle wasting and atrophy, not elsewhere classified, multiple sites: Secondary | ICD-10-CM | POA: Diagnosis not present

## 2020-09-30 DIAGNOSIS — F0390 Unspecified dementia without behavioral disturbance: Secondary | ICD-10-CM | POA: Diagnosis not present

## 2020-10-01 DIAGNOSIS — I679 Cerebrovascular disease, unspecified: Secondary | ICD-10-CM | POA: Diagnosis not present

## 2020-10-01 DIAGNOSIS — R1312 Dysphagia, oropharyngeal phase: Secondary | ICD-10-CM | POA: Diagnosis not present

## 2020-10-01 DIAGNOSIS — F0390 Unspecified dementia without behavioral disturbance: Secondary | ICD-10-CM | POA: Diagnosis not present

## 2020-10-01 DIAGNOSIS — M6259 Muscle wasting and atrophy, not elsewhere classified, multiple sites: Secondary | ICD-10-CM | POA: Diagnosis not present

## 2020-10-01 DIAGNOSIS — I10 Essential (primary) hypertension: Secondary | ICD-10-CM | POA: Diagnosis not present

## 2020-10-02 DIAGNOSIS — R1312 Dysphagia, oropharyngeal phase: Secondary | ICD-10-CM | POA: Diagnosis not present

## 2020-10-02 DIAGNOSIS — F0390 Unspecified dementia without behavioral disturbance: Secondary | ICD-10-CM | POA: Diagnosis not present

## 2020-10-02 DIAGNOSIS — I679 Cerebrovascular disease, unspecified: Secondary | ICD-10-CM | POA: Diagnosis not present

## 2020-10-02 DIAGNOSIS — I10 Essential (primary) hypertension: Secondary | ICD-10-CM | POA: Diagnosis not present

## 2020-10-02 DIAGNOSIS — M6259 Muscle wasting and atrophy, not elsewhere classified, multiple sites: Secondary | ICD-10-CM | POA: Diagnosis not present

## 2020-10-08 DIAGNOSIS — F0390 Unspecified dementia without behavioral disturbance: Secondary | ICD-10-CM | POA: Diagnosis not present

## 2020-10-08 DIAGNOSIS — I10 Essential (primary) hypertension: Secondary | ICD-10-CM | POA: Diagnosis not present

## 2020-10-08 DIAGNOSIS — I679 Cerebrovascular disease, unspecified: Secondary | ICD-10-CM | POA: Diagnosis not present

## 2020-10-08 DIAGNOSIS — R1312 Dysphagia, oropharyngeal phase: Secondary | ICD-10-CM | POA: Diagnosis not present

## 2020-10-08 DIAGNOSIS — M6259 Muscle wasting and atrophy, not elsewhere classified, multiple sites: Secondary | ICD-10-CM | POA: Diagnosis not present

## 2020-10-10 DIAGNOSIS — R1312 Dysphagia, oropharyngeal phase: Secondary | ICD-10-CM | POA: Diagnosis not present

## 2020-10-10 DIAGNOSIS — I679 Cerebrovascular disease, unspecified: Secondary | ICD-10-CM | POA: Diagnosis not present

## 2020-10-10 DIAGNOSIS — I10 Essential (primary) hypertension: Secondary | ICD-10-CM | POA: Diagnosis not present

## 2020-10-10 DIAGNOSIS — F0390 Unspecified dementia without behavioral disturbance: Secondary | ICD-10-CM | POA: Diagnosis not present

## 2020-10-11 DIAGNOSIS — F32A Depression, unspecified: Secondary | ICD-10-CM | POA: Diagnosis not present

## 2020-10-11 DIAGNOSIS — H409 Unspecified glaucoma: Secondary | ICD-10-CM | POA: Diagnosis not present

## 2020-10-11 DIAGNOSIS — I679 Cerebrovascular disease, unspecified: Secondary | ICD-10-CM | POA: Diagnosis not present

## 2020-10-11 DIAGNOSIS — G301 Alzheimer's disease with late onset: Secondary | ICD-10-CM | POA: Diagnosis not present

## 2020-10-11 DIAGNOSIS — F0390 Unspecified dementia without behavioral disturbance: Secondary | ICD-10-CM | POA: Diagnosis not present

## 2020-10-11 DIAGNOSIS — I10 Essential (primary) hypertension: Secondary | ICD-10-CM | POA: Diagnosis not present

## 2020-10-11 DIAGNOSIS — R1312 Dysphagia, oropharyngeal phase: Secondary | ICD-10-CM | POA: Diagnosis not present

## 2020-10-11 DIAGNOSIS — F0280 Dementia in other diseases classified elsewhere without behavioral disturbance: Secondary | ICD-10-CM | POA: Diagnosis not present

## 2020-10-11 DIAGNOSIS — N5089 Other specified disorders of the male genital organs: Secondary | ICD-10-CM | POA: Diagnosis not present

## 2020-10-15 DIAGNOSIS — I10 Essential (primary) hypertension: Secondary | ICD-10-CM | POA: Diagnosis not present

## 2020-10-15 DIAGNOSIS — I679 Cerebrovascular disease, unspecified: Secondary | ICD-10-CM | POA: Diagnosis not present

## 2020-10-15 DIAGNOSIS — F0390 Unspecified dementia without behavioral disturbance: Secondary | ICD-10-CM | POA: Diagnosis not present

## 2020-10-15 DIAGNOSIS — R1312 Dysphagia, oropharyngeal phase: Secondary | ICD-10-CM | POA: Diagnosis not present

## 2020-10-16 DIAGNOSIS — F0390 Unspecified dementia without behavioral disturbance: Secondary | ICD-10-CM | POA: Diagnosis not present

## 2020-10-16 DIAGNOSIS — I10 Essential (primary) hypertension: Secondary | ICD-10-CM | POA: Diagnosis not present

## 2020-10-16 DIAGNOSIS — R1312 Dysphagia, oropharyngeal phase: Secondary | ICD-10-CM | POA: Diagnosis not present

## 2020-10-16 DIAGNOSIS — I679 Cerebrovascular disease, unspecified: Secondary | ICD-10-CM | POA: Diagnosis not present

## 2020-10-17 DIAGNOSIS — I679 Cerebrovascular disease, unspecified: Secondary | ICD-10-CM | POA: Diagnosis not present

## 2020-10-17 DIAGNOSIS — I10 Essential (primary) hypertension: Secondary | ICD-10-CM | POA: Diagnosis not present

## 2020-10-17 DIAGNOSIS — F0390 Unspecified dementia without behavioral disturbance: Secondary | ICD-10-CM | POA: Diagnosis not present

## 2020-10-17 DIAGNOSIS — R1312 Dysphagia, oropharyngeal phase: Secondary | ICD-10-CM | POA: Diagnosis not present

## 2020-10-18 DIAGNOSIS — I10 Essential (primary) hypertension: Secondary | ICD-10-CM | POA: Diagnosis not present

## 2020-10-18 DIAGNOSIS — F0390 Unspecified dementia without behavioral disturbance: Secondary | ICD-10-CM | POA: Diagnosis not present

## 2020-10-18 DIAGNOSIS — R1312 Dysphagia, oropharyngeal phase: Secondary | ICD-10-CM | POA: Diagnosis not present

## 2020-10-18 DIAGNOSIS — I679 Cerebrovascular disease, unspecified: Secondary | ICD-10-CM | POA: Diagnosis not present

## 2020-10-22 DIAGNOSIS — I10 Essential (primary) hypertension: Secondary | ICD-10-CM | POA: Diagnosis not present

## 2020-10-22 DIAGNOSIS — R1312 Dysphagia, oropharyngeal phase: Secondary | ICD-10-CM | POA: Diagnosis not present

## 2020-10-22 DIAGNOSIS — I679 Cerebrovascular disease, unspecified: Secondary | ICD-10-CM | POA: Diagnosis not present

## 2020-10-22 DIAGNOSIS — F0390 Unspecified dementia without behavioral disturbance: Secondary | ICD-10-CM | POA: Diagnosis not present

## 2020-10-23 DIAGNOSIS — F0390 Unspecified dementia without behavioral disturbance: Secondary | ICD-10-CM | POA: Diagnosis not present

## 2020-10-23 DIAGNOSIS — I10 Essential (primary) hypertension: Secondary | ICD-10-CM | POA: Diagnosis not present

## 2020-10-23 DIAGNOSIS — I679 Cerebrovascular disease, unspecified: Secondary | ICD-10-CM | POA: Diagnosis not present

## 2020-10-23 DIAGNOSIS — R1312 Dysphagia, oropharyngeal phase: Secondary | ICD-10-CM | POA: Diagnosis not present

## 2020-10-24 DIAGNOSIS — R1312 Dysphagia, oropharyngeal phase: Secondary | ICD-10-CM | POA: Diagnosis not present

## 2020-10-24 DIAGNOSIS — F0390 Unspecified dementia without behavioral disturbance: Secondary | ICD-10-CM | POA: Diagnosis not present

## 2020-10-24 DIAGNOSIS — I10 Essential (primary) hypertension: Secondary | ICD-10-CM | POA: Diagnosis not present

## 2020-10-24 DIAGNOSIS — I679 Cerebrovascular disease, unspecified: Secondary | ICD-10-CM | POA: Diagnosis not present

## 2020-10-25 DIAGNOSIS — I679 Cerebrovascular disease, unspecified: Secondary | ICD-10-CM | POA: Diagnosis not present

## 2020-10-25 DIAGNOSIS — R1312 Dysphagia, oropharyngeal phase: Secondary | ICD-10-CM | POA: Diagnosis not present

## 2020-10-25 DIAGNOSIS — I10 Essential (primary) hypertension: Secondary | ICD-10-CM | POA: Diagnosis not present

## 2020-10-25 DIAGNOSIS — F0390 Unspecified dementia without behavioral disturbance: Secondary | ICD-10-CM | POA: Diagnosis not present

## 2020-10-29 DIAGNOSIS — I679 Cerebrovascular disease, unspecified: Secondary | ICD-10-CM | POA: Diagnosis not present

## 2020-10-29 DIAGNOSIS — F0390 Unspecified dementia without behavioral disturbance: Secondary | ICD-10-CM | POA: Diagnosis not present

## 2020-10-29 DIAGNOSIS — I10 Essential (primary) hypertension: Secondary | ICD-10-CM | POA: Diagnosis not present

## 2020-10-29 DIAGNOSIS — R1312 Dysphagia, oropharyngeal phase: Secondary | ICD-10-CM | POA: Diagnosis not present

## 2020-10-30 DIAGNOSIS — F0390 Unspecified dementia without behavioral disturbance: Secondary | ICD-10-CM | POA: Diagnosis not present

## 2020-10-30 DIAGNOSIS — R1312 Dysphagia, oropharyngeal phase: Secondary | ICD-10-CM | POA: Diagnosis not present

## 2020-10-30 DIAGNOSIS — I10 Essential (primary) hypertension: Secondary | ICD-10-CM | POA: Diagnosis not present

## 2020-10-30 DIAGNOSIS — I679 Cerebrovascular disease, unspecified: Secondary | ICD-10-CM | POA: Diagnosis not present

## 2020-10-31 DIAGNOSIS — R1312 Dysphagia, oropharyngeal phase: Secondary | ICD-10-CM | POA: Diagnosis not present

## 2020-10-31 DIAGNOSIS — F0390 Unspecified dementia without behavioral disturbance: Secondary | ICD-10-CM | POA: Diagnosis not present

## 2020-10-31 DIAGNOSIS — I10 Essential (primary) hypertension: Secondary | ICD-10-CM | POA: Diagnosis not present

## 2020-10-31 DIAGNOSIS — I679 Cerebrovascular disease, unspecified: Secondary | ICD-10-CM | POA: Diagnosis not present

## 2020-11-01 DIAGNOSIS — I10 Essential (primary) hypertension: Secondary | ICD-10-CM | POA: Diagnosis not present

## 2020-11-01 DIAGNOSIS — I679 Cerebrovascular disease, unspecified: Secondary | ICD-10-CM | POA: Diagnosis not present

## 2020-11-01 DIAGNOSIS — R1312 Dysphagia, oropharyngeal phase: Secondary | ICD-10-CM | POA: Diagnosis not present

## 2020-11-01 DIAGNOSIS — F0390 Unspecified dementia without behavioral disturbance: Secondary | ICD-10-CM | POA: Diagnosis not present

## 2020-11-04 DIAGNOSIS — G301 Alzheimer's disease with late onset: Secondary | ICD-10-CM | POA: Diagnosis not present

## 2020-11-04 DIAGNOSIS — H409 Unspecified glaucoma: Secondary | ICD-10-CM | POA: Diagnosis not present

## 2020-11-04 DIAGNOSIS — F0280 Dementia in other diseases classified elsewhere without behavioral disturbance: Secondary | ICD-10-CM | POA: Diagnosis not present

## 2020-11-04 DIAGNOSIS — F0390 Unspecified dementia without behavioral disturbance: Secondary | ICD-10-CM | POA: Diagnosis not present

## 2020-11-04 DIAGNOSIS — I10 Essential (primary) hypertension: Secondary | ICD-10-CM | POA: Diagnosis not present

## 2020-11-04 DIAGNOSIS — I679 Cerebrovascular disease, unspecified: Secondary | ICD-10-CM | POA: Diagnosis not present

## 2020-11-04 DIAGNOSIS — N400 Enlarged prostate without lower urinary tract symptoms: Secondary | ICD-10-CM | POA: Diagnosis not present

## 2020-11-04 DIAGNOSIS — D649 Anemia, unspecified: Secondary | ICD-10-CM | POA: Diagnosis not present

## 2021-09-08 ENCOUNTER — Encounter (HOSPITAL_COMMUNITY): Payer: Self-pay | Admitting: Emergency Medicine

## 2021-09-08 ENCOUNTER — Other Ambulatory Visit: Payer: Self-pay

## 2021-09-08 ENCOUNTER — Emergency Department (HOSPITAL_COMMUNITY): Payer: Medicare Other

## 2021-09-08 ENCOUNTER — Emergency Department (HOSPITAL_COMMUNITY)
Admission: EM | Admit: 2021-09-08 | Discharge: 2021-09-09 | Disposition: A | Discharge status: Home / Self Care | Payer: Medicare Other | Attending: Emergency Medicine | Admitting: Emergency Medicine

## 2021-09-08 DIAGNOSIS — Z87891 Personal history of nicotine dependence: Secondary | ICD-10-CM | POA: Diagnosis not present

## 2021-09-08 DIAGNOSIS — E039 Hypothyroidism, unspecified: Secondary | ICD-10-CM | POA: Diagnosis not present

## 2021-09-08 DIAGNOSIS — F0390 Unspecified dementia without behavioral disturbance: Secondary | ICD-10-CM | POA: Diagnosis not present

## 2021-09-08 DIAGNOSIS — W19XXXA Unspecified fall, initial encounter: Secondary | ICD-10-CM

## 2021-09-08 DIAGNOSIS — S0083XA Contusion of other part of head, initial encounter: Secondary | ICD-10-CM

## 2021-09-08 DIAGNOSIS — R609 Edema, unspecified: Secondary | ICD-10-CM | POA: Diagnosis not present

## 2021-09-08 DIAGNOSIS — Z79899 Other long term (current) drug therapy: Secondary | ICD-10-CM | POA: Insufficient documentation

## 2021-09-08 DIAGNOSIS — S0181XA Laceration without foreign body of other part of head, initial encounter: Secondary | ICD-10-CM | POA: Diagnosis not present

## 2021-09-08 DIAGNOSIS — I10 Essential (primary) hypertension: Secondary | ICD-10-CM | POA: Insufficient documentation

## 2021-09-08 DIAGNOSIS — W01198A Fall on same level from slipping, tripping and stumbling with subsequent striking against other object, initial encounter: Secondary | ICD-10-CM | POA: Diagnosis not present

## 2021-09-08 DIAGNOSIS — S0990XA Unspecified injury of head, initial encounter: Secondary | ICD-10-CM | POA: Diagnosis present

## 2021-09-08 NOTE — ED Provider Notes (Signed)
[facility] DEPT Provider Note   CSN: [id number] Arrival date & time: 09/08/21  1747     History Chief Complaint  Patient presents with   Jesus Ramsey is a 87 y.o. male.  Pt presents to the ED today with a fall.  Pt had an unwitnessed fall.  He did hit his forehead and has a hematoma and lac to the right forehead.  Pt does have a hx of dementia and is unable to tell me what happened.  He thinks it happened while playing baseball.  He has no pain.        Past Medical History:  Diagnosis Date   Altered mental status    B12 deficiency 01/14/2012   B12 deficiency anemia    BPH (benign prostatic hypertrophy)    Cataract    Chronic hyponatremia    /notes 01/13/2012   Dysphagia    Hypertension    Mental retardation    Pneumonia    Psychogenic polydipsia    Seizures [facility])     Patient Active Problem List   Diagnosis Date Noted   Congestion of upper airway    Advanced care planning/counseling discussion    Goals of care, counseling/discussion    Bradycardia    Hypoglycemia    Protein-calorie malnutrition, severe 07/14/2018   Adult failure to thrive    Acute metabolic encephalopathy    Aspiration pneumonia due to gastric secretions ([place])    Palliative care by specialist    Altered mental status 07/10/2018   Hypothermia 07/10/2018   Severe sepsis with septic shock [facility])    Hypothyroidism    Community acquired pneumonia of left lower lobe of lung    Acute cystitis without hematuria    DNR (do not resuscitate) discussion    Dysphagia 04/08/2015   Seizures ([place]) 08/10/2014   Essential hypertension 08/10/2014   Absolute anemia 08/10/2014   Weakness generalized 08/10/2014   Psychogenic polydipsia 01/14/2012   BPH (benign prostatic hyperplasia) 01/14/2012   Mental retardation 01/13/2012   Hyponatremia 01/13/2012    Past Surgical History:  Procedure Laterality Date   NO PAST SURGERIES         Family History  Family history  unknown: Yes    Social History   Tobacco Use   Smoking status: Former   Smokeless tobacco: Never  Substance Use Topics   Alcohol use: No   Drug use: No    Home Medications Prior to Admission medications   Medication Sig Start Date End Date Taking? Authorizing Provider  acetaminophen (TYLENOL) 325 MG tablet Take 2 tablets (650 mg total) by mouth every 6 (six) hours as needed for mild pain (or Fever >/= 101). 07/25/18   [name], MD  acetaminophen (TYLENOL) 650 MG suppository Place 1 suppository (650 mg total) rectally every 6 (six) hours as needed for mild pain (or Fever >/= 101). 07/25/18   [name], MD  chlorhexidine (PERIDEX) 0.12 % solution 15 mLs by Mouth Rinse route 2 (two) times daily. 07/25/18   [name], MD  haloperidol (HALDOL) 0.5 MG tablet Take 1 tablet (0.5 mg total) by mouth every 4 (four) hours as needed for agitation (or delirium). 07/25/18   [name], MD  haloperidol (HALDOL) 2 MG/ML solution Place 0.3 mLs (0.6 mg total) under the tongue every 4 (four) hours as needed for agitation (or delirium). 07/25/18   [name], MD  haloperidol lactate (HALDOL) 5 MG/ML injection Inject 0.1 mLs (0.5  mg total) into the vein every 4 (four) hours as needed (or delirium). 07/25/18   [name], MD  latanoprost (XALATAN) 0.005 % ophthalmic solution Place 1 drop into both eyes at bedtime.    [provider]  Morphine Sulfate (MORPHINE CONCENTRATE) 10 MG/0.5ML SOLN concentrated solution Take 0.25 mLs (5 mg total) by mouth every 2 (two) hours as needed for moderate pain (or dyspnea). 07/25/18   [name], MD  Morphine Sulfate (MORPHINE CONCENTRATE) 10 MG/0.5ML SOLN concentrated solution Place 0.25 mLs (5 mg total) under the tongue every 2 (two) hours as needed for moderate pain (or dyspnea). 07/25/18   [name], MD  mouth rinse LIQD solution 15 mLs by Mouth Rinse route 2 times daily at 12 noon and 4 pm. 07/25/18   [name], MD  ondansetron (ZOFRAN) 4 MG  tablet Take 1 tablet (4 mg total) by mouth every 6 (six) hours as needed for nausea. 07/25/18   [name], MD  ondansetron [facility]) 4 MG/2ML SOLN injection Inject 2 mLs (4 mg total) into the vein every 6 (six) hours as needed for nausea. 07/25/18   [name], MD  scopolamine (TRANSDERM-SCOP) 1 MG/3DAYS Place 1 patch (1.5 mg total) onto the skin every 3 (three) days. 07/25/18   [name], MD  timolol (TIMOPTIC) 0.5 % ophthalmic solution Place 1 drop into both eyes every morning. 05/23/18   [provider]    Allergies    Patient has no known allergies.  Review of Systems   Review of Systems  Unable to perform ROS: Dementia  HENT:         Forehead hematoma/lac  All other systems reviewed and are negative.  Physical Exam Updated Vital Signs BP 131/76 (BP Location: Left Arm)   Pulse 90   Temp 97.6 F (36.4 C) (Oral)   Resp 16   Ht 5' 10.98" (1.803 m)   Wt 75.5 kg   SpO2 97%   BMI 23.23 kg/m   Physical Exam Vitals and nursing note reviewed.  Constitutional:      Appearance: Normal appearance.  HENT:     Head: Normocephalic.     Right Ear: External ear normal.     Left Ear: External ear normal.     Nose: Nose normal.     Mouth/Throat:     Mouth: Mucous membranes are moist.     Pharynx: Oropharynx is clear.  Eyes:     Extraocular Movements: Extraocular movements intact.     Conjunctiva/sclera: Conjunctivae normal.     Pupils: Pupils are equal, round, and reactive to light.  Cardiovascular:     Rate and Rhythm: Normal rate and regular rhythm.     Pulses: Normal pulses.     Heart sounds: Normal heart sounds.  Pulmonary:     Effort: Pulmonary effort is normal.     Breath sounds: Normal breath sounds.  Abdominal:     General: Abdomen is flat. Bowel sounds are normal.     Palpations: Abdomen is soft.  Musculoskeletal:        General: Normal range of motion.     Cervical back: Normal range of motion and neck supple.     Right lower leg: Edema present.      Left lower leg: Edema present.  Skin:    General: Skin is warm.     Capillary Refill: Capillary refill takes less than 2 seconds.  Neurological:     General: No focal deficit present.  Mental Status: He is alert. Mental status is at baseline.  Psychiatric:        Mood and Affect: Mood normal.    ED Results / Procedures / Treatments   Labs (all labs ordered are listed, but only abnormal results are displayed) Labs Reviewed - No data to display  EKG None  Radiology DG Chest 2 View  Result Date: 09/08/2021 CLINICAL DATA:  Unwitnessed fall. EXAM: CHEST - 2 VIEW COMPARISON:  July 19, 2018. FINDINGS: Stable cardiomediastinal silhouette. Minimal bibasilar subsegmental atelectasis is noted. Stable elevated left hemidiaphragm. Bony thorax is unremarkable. IMPRESSION: Minimal bibasilar subsegmental atelectasis. Electronically Signed   By: Marijo Conception M.D.   On: 09/08/2021 18:43   DG Pelvis 1-2 Views  Result Date: 09/08/2021 CLINICAL DATA:  Unwitnessed fall. EXAM: PELVIS - 1-2 VIEW COMPARISON:  None. FINDINGS: There is no evidence of pelvic fracture or diastasis. No pelvic bone lesions are seen. IMPRESSION: Negative. Electronically Signed   By: Marijo Conception M.D.   On: 09/08/2021 18:37   CT Head Wo Contrast  Result Date: 09/08/2021 CLINICAL DATA:  Head trauma, minor (Age >= 65y) EXAM: CT HEAD WITHOUT CONTRAST TECHNIQUE: Contiguous axial images were obtained from the base of the skull through the vertex without intravenous contrast. COMPARISON:  Head CT 07/12/2018 FINDINGS: Brain: No intracranial hemorrhage, mass effect, or midline shift. Generalized atrophy and ventriculomegaly, likely due to central atrophy. Findings are grossly stable from prior exam. The basilar cisterns are patent. No evidence of territorial infarct or acute ischemia. No extra-axial or intracranial fluid collection. Vascular: No hyperdense vessel. Skull: Right frontal scalp hematoma but no skull fracture. No  focal bone lesion. Sinuses/Orbits: There is motion artifact through the orbits and sinuses, but no obvious fracture. Other: Right frontal scalp hematoma and laceration. IMPRESSION: 1. Right frontal scalp hematoma and laceration. No acute intracranial abnormality. No skull fracture. 2. Unchanged atrophy and chronic small vessel ischemia. Electronically Signed   By: Keith Rake M.D.   On: 09/08/2021 18:26   CT Cervical Spine Wo Contrast  Result Date: 09/08/2021 CLINICAL DATA:  Neck trauma (Age >= 65y) EXAM: CT CERVICAL SPINE WITHOUT CONTRAST TECHNIQUE: Multidetector CT imaging of the cervical spine was performed without intravenous contrast. Multiplanar CT image reconstructions were also generated. COMPARISON:  No prior cervical spine imaging available. FINDINGS: Alignment: There is grade 1 anterolisthesis of C4 on C5, grade 1 retrolisthesis of C5 on C6, and grade 1 anterolisthesis of C7 on T1. No evidence of traumatic malalignment, jumped or perched facets. Skull base and vertebrae: No acute fracture. Vertebral body heights are maintained. The dens and skull base are intact. Partial fusion of C2 and C3 facets may be degenerative or congenital. Soft tissues and spinal canal: No prevertebral fluid or swelling. No visible canal hematoma. Disc levels: Diffuse degenerative disc disease, prominent at C4-C5, C5-C6, and C6-C7. Moderate multilevel facet hypertrophy. Multilevel multifactorial spinal canal narrowing. Upper chest: No acute or unexpected finding. Other: None. IMPRESSION: 1. No acute fracture or subluxation of the cervical spine. 2. Multilevel degenerative disc disease and facet hypertrophy. Multilevel multifactorial spinal canal narrowing. Electronically Signed   By: Keith Rake M.D.   On: 09/08/2021 18:29    Procedures .Marland KitchenLaceration Repair  Date/Time: 09/08/2021 6:45 PM Performed by: Isla Pence, MD Authorized by: Isla Pence, MD   Consent:    Consent obtained:  Emergent  situation Universal protocol:    Patient identity confirmed:  Arm band Anesthesia:    Anesthesia method:  None Laceration details:  Location:  Face   Face location:  Forehead   Length (cm):  1 Treatment:    Area cleansed with:  Saline   Amount of cleaning:  Standard   Irrigation solution:  Sterile saline Skin repair:    Repair method:  Tissue adhesive Approximation:    Approximation:  Close Repair type:    Repair type:  Simple Post-procedure details:    Dressing:  Open (no dressing)   Procedure completion:  Tolerated well, no immediate complications   Medications Ordered in ED Medications - No data to display  ED Course  I have reviewed the triage vital signs and the nursing notes.  Pertinent labs & imaging results that were available during my care of the patient were reviewed by me and considered in my medical decision making (see chart for details).    MDM Rules/Calculators/A&P                           Pt has no fractures or internal injuries.  Pt is stable for d/c.  Return if worse.  F/u with pcp. Final Clinical Impression(s) / ED Diagnoses Final diagnoses:  Fall, initial encounter  Laceration of forehead, initial encounter  Traumatic hematoma of forehead, initial encounter    Rx / DC Orders ED Discharge Orders     None        Isla Pence, MD 09/08/21 1850

## 2021-09-08 NOTE — ED Triage Notes (Signed)
BIBA from [place]. Per EMS pt c/o unwitnessed ground level mechanical fall. Pt fell forward and has a hematoma on the right side of his forehead. Hx of dementia. Pt A&O x1 which is baseline.   BP 130/60 HR 80 98% room air.  CBG

## 2021-09-09 NOTE — ED Notes (Signed)
[facility] contacted, stated patients name was never on list. Pt now placed on list for transport.

## 2022-04-09 DEATH — deceased
